# Patient Record
Sex: Male | Born: 1950 | Race: Black or African American | Hispanic: No | Marital: Married | Smoking: Never smoker
Health system: Southern US, Community
[De-identification: ages and names within clinical notes are randomized; demographics above are authoritative.]

## PROBLEM LIST (undated history)

## (undated) DIAGNOSIS — K219 Gastro-esophageal reflux disease without esophagitis: Secondary | ICD-10-CM

## (undated) DIAGNOSIS — L409 Psoriasis, unspecified: Secondary | ICD-10-CM

## (undated) DIAGNOSIS — L309 Dermatitis, unspecified: Secondary | ICD-10-CM

## (undated) DIAGNOSIS — R945 Abnormal results of liver function studies: Secondary | ICD-10-CM

## (undated) DIAGNOSIS — T7840XA Allergy, unspecified, initial encounter: Secondary | ICD-10-CM

## (undated) DIAGNOSIS — E785 Hyperlipidemia, unspecified: Secondary | ICD-10-CM

## (undated) DIAGNOSIS — I10 Essential (primary) hypertension: Secondary | ICD-10-CM

## (undated) HISTORY — DX: Abnormal results of liver function studies: R94.5

## (undated) HISTORY — DX: Gastro-esophageal reflux disease without esophagitis: K21.9

## (undated) HISTORY — DX: Essential (primary) hypertension: I10

## (undated) HISTORY — DX: Hyperlipidemia, unspecified: E78.5

## (undated) HISTORY — DX: Allergy, unspecified, initial encounter: T78.40XA

## (undated) HISTORY — DX: Dermatitis, unspecified: L30.9

## (undated) HISTORY — DX: Psoriasis, unspecified: L40.9

---

## 2005-05-13 ENCOUNTER — Ambulatory Visit (HOSPITAL_COMMUNITY): Admission: RE | Admit: 2005-05-13 | Discharge: 2005-05-13 | Payer: Self-pay | Admitting: *Deleted

## 2005-05-13 ENCOUNTER — Encounter (INDEPENDENT_AMBULATORY_CARE_PROVIDER_SITE_OTHER): Payer: Self-pay | Admitting: Specialist

## 2005-05-13 ENCOUNTER — Encounter: Payer: Self-pay | Admitting: Internal Medicine

## 2007-03-29 ENCOUNTER — Emergency Department (HOSPITAL_COMMUNITY): Admission: EM | Admit: 2007-03-29 | Discharge: 2007-03-29 | Payer: Self-pay | Admitting: Emergency Medicine

## 2008-04-29 ENCOUNTER — Ambulatory Visit (HOSPITAL_COMMUNITY): Admission: RE | Admit: 2008-04-29 | Discharge: 2008-04-29 | Payer: Self-pay | Admitting: Otolaryngology

## 2009-11-19 ENCOUNTER — Encounter: Payer: Self-pay | Admitting: Internal Medicine

## 2009-11-20 ENCOUNTER — Encounter (INDEPENDENT_AMBULATORY_CARE_PROVIDER_SITE_OTHER): Payer: Self-pay | Admitting: *Deleted

## 2009-11-28 ENCOUNTER — Encounter (INDEPENDENT_AMBULATORY_CARE_PROVIDER_SITE_OTHER): Payer: Self-pay | Admitting: *Deleted

## 2010-01-20 ENCOUNTER — Encounter (INDEPENDENT_AMBULATORY_CARE_PROVIDER_SITE_OTHER): Payer: Self-pay | Admitting: *Deleted

## 2010-01-20 ENCOUNTER — Ambulatory Visit: Payer: Self-pay | Admitting: Internal Medicine

## 2010-01-20 DIAGNOSIS — K219 Gastro-esophageal reflux disease without esophagitis: Secondary | ICD-10-CM | POA: Insufficient documentation

## 2010-01-20 DIAGNOSIS — R195 Other fecal abnormalities: Secondary | ICD-10-CM

## 2010-02-23 ENCOUNTER — Telehealth: Payer: Self-pay | Admitting: Internal Medicine

## 2010-02-23 ENCOUNTER — Ambulatory Visit: Payer: Self-pay | Admitting: Internal Medicine

## 2010-10-08 ENCOUNTER — Encounter (INDEPENDENT_AMBULATORY_CARE_PROVIDER_SITE_OTHER): Payer: Self-pay | Admitting: *Deleted

## 2010-10-19 ENCOUNTER — Encounter (INDEPENDENT_AMBULATORY_CARE_PROVIDER_SITE_OTHER): Payer: Self-pay | Admitting: *Deleted

## 2010-10-20 ENCOUNTER — Ambulatory Visit: Payer: Self-pay | Admitting: Internal Medicine

## 2010-11-02 ENCOUNTER — Ambulatory Visit: Payer: Self-pay | Admitting: Internal Medicine

## 2010-11-03 ENCOUNTER — Encounter: Payer: Self-pay | Admitting: Internal Medicine

## 2010-12-24 NOTE — Procedures (Signed)
Summary: Colonoscopy  Patient: [name] Note: All result statuses are Final unless otherwise noted.  Tests: (1) Colonoscopy (COL)   COL Colonoscopy           DONE     [facility]     [address]     [place], [place]  [address]           COLONOSCOPY PROCEDURE REPORT           PATIENT:  Frank Howell, Frank Howell  MR#:  [id number]     BIRTHDATE:  [birth year], 59 yrs. old  GENDER:  male     ENDOSCOPIST:  Iva Boop, MD, [facility]     REF. BY:  Robert Bellow, M.D.     PROCEDURE DATE:  11/02/2010     PROCEDURE:  Colonoscopy with snare polypectomy     ASA CLASS:  Class II     INDICATIONS:  heme positive stool     MEDICATIONS:   Fentanyl 50 mcg IV, Versed 5 mg           DESCRIPTION OF PROCEDURE:   After the risks benefits and     alternatives of the procedure were thoroughly explained, informed     consent was obtained.  Digital rectal exam was performed and     revealed external hemorrhoids, no rectal masses and normal     prostate.   The LB 180AL [id number] endoscope was introduced through     the anus and advanced to the cecum, which was identified by both     the appendix and ileocecal valve, without limitations.  The     quality of the prep was excellent, using MoviPrep.  The instrument     was then slowly withdrawn as the colon was fully examined.     <<PROCEDUREIMAGES>>           FINDINGS:  A diminutive polyp was found at the splenic flexure.     Polyp was snared without cautery. Retrieval was successful. snare     polyp  This was otherwise a normal examination of the colon.     External hemorrhoids were found in the anal canal.   Retroflexed     views in the rectum revealed no abnormalities.    The scope was     then withdrawn from the patient and the procedure completed.           COMPLICATIONS:  None     ENDOSCOPIC IMPRESSION:     1) Diminutive polyp at the splenic flexure     2) External hemorrhoids     3) Otherwise normal examination     RECOMMENDATIONS:        REPEAT EXAM:  In for Colonoscopy, pending biopsy results.           ______________________________     Iva Boop, MD, Clementeen Graham           CC:  Robert Bellow, MDThe Patient           n.     Rosalie DoctorIva Boop at 11/02/2010 09:01 AM           [place], [place], [phone]  Note: An exclamation mark (!) indicates a result that was not dispersed into the flowsheet. Document Creation Date: 11/02/2010 9:01 AM _______________________________________________________________________  (1) Order result status: Final Collection or observation date-time: 11/02/2010 08:54 Requested date-time:  Receipt date-time:  Reported date-time:  Referring Physician:   Ordering Physician: Stan Head [phone]) Specimen Source:  Source: Launa Grill Order Number: [id number] Lab site:   Appended Document: Colonoscopy   Colonoscopy  Procedure date:  11/02/2010  Findings:          1) Diminutive polyp at the splenic flexure     2) External hemorrhoids     3) Otherwise normal examination   1. Colon, polyp(s), splenic flexure :  -  TUBULAR ADENOMA (X1); NEGATIVE FOR HIGH GRADE DYSPLASIA OR MALIGNANCY.  Comments:      Repeat colonoscopy in 5 years.    Appended Document: Colonoscopy     Procedures Next Due Date:    Colonoscopy: 10/2015

## 2010-12-24 NOTE — Letter (Signed)
Summary: [facility]  [facility]   Imported By: Lester  01/27/2010 10:58:27  _____________________________________________________________________  External Attachment:    Type:   Image     Comment:   External Document

## 2010-12-24 NOTE — Progress Notes (Signed)
Summary: cx fee?  Phone Note Call from Patient   Caller: Patient Call For: Dr. Leone Payor Reason for Call: Talk to Doctor Summary of Call: Dr. Leone Payor, do you wish to charge this pt $100 cx fee? Pt has COL sch'ed for today at 11AM... called this morning stating that he "forgot" his appt and is not in [place]... informed pt that he may be charged a cx fee Initial call taken by: Vallarie Mare,  February 23, 2010 8:18 AM  Follow-up for Phone Call        YES  Follow-up by: Iva Boop MD, Clementeen Graham,  February 23, 2010 1:31 PM  Additional Follow-up for Phone Call Additional follow up Details #1::        Patient BILLED. Additional Follow-up by: Leanor Kail [facility],  February 25, 2010 3:42 PM

## 2010-12-24 NOTE — Letter (Signed)
Summary: New Patient letter  [facility]  [address] [place], [place] [address]   Phone: [phone]  Fax: [phone]       11/28/2009 MRN: [id number]  Frank Howell [address] [place], [place]  [address]  Dear Mr. Frank Howell,  Welcome to the Gastroenterology Division at [facility].    You are scheduled to see Dr.  Leone Payor on 12-24-09 at [facility] on the 3rd floor at [facility], [address]  We ask that you try to arrive at our office 15 minutes prior to your appointment time to allow for check-in.  We would like you to complete the enclosed self-administered evaluation form prior to your visit and bring it with you on the day of your appointment.  We will review it with you.  Also, please bring a complete list of all your medications or, if you prefer, bring the medication bottles and we will list them.  Please bring your insurance card so that we may make a copy of it.  If your insurance requires a referral to see a specialist, please bring your referral form from your primary care physician.  Co-payments are due at the time of your visit and may be paid by cash, check or credit card.     Your office visit will consist of a consult with your physician (includes a physical exam), any laboratory testing he/she may order, scheduling of any necessary diagnostic testing (e.g. x-ray, ultrasound, CT-scan), and scheduling of a procedure (e.g. Endoscopy, Colonoscopy) if required.  Please allow enough time on your schedule to allow for any/all of these possibilities.    If you cannot keep your appointment, please call [phone] to cancel or reschedule prior to your appointment date.  This allows [place] the opportunity to schedule an appointment for another patient in need of care.  If you do not cancel or reschedule by 5 p.m. the business day prior to your appointment date, you will be charged a $50.00 late cancellation/no-show fee.    Thank you for choosing [facility] for your medical needs.  We appreciate the opportunity to care for you.  Please visit [place] at our website  to learn more about our practice.                     Sincerely,                                                             The Gastroenterology Division

## 2010-12-24 NOTE — Procedures (Signed)
Summary: Colonoscopy: Dr. Virginia Rochester NORMAL   Colonoscopy  Procedure date:  05/13/2005  Findings:      Results: Normal. Location:  [facility].   NAME:  Frank Howell, Frank Howell NO.:  [id number]   MEDICAL RECORD NO.:  [id number]          PATIENT TYPE:  AMB   LOCATION:  ENDO                         FACILITY:  [facility]   PHYSICIAN:  [name], M.D.    DATE OF BIRTH:  May 16, 1951   DATE OF PROCEDURE:  05/13/2005  DATE OF DISCHARGE:                                 OPERATIVE REPORT   INDICATIONS:  Colon cancer screening.   ANESTHESIA:  Demerol 20, versed 1 milligram.   PROCEDURE:  With patient mildly sedated in the left lateral decubitus  position, a rectal examination was performed.  Perineum showed external skin  tags.  Rectal examination was unremarkable including normal prostate.  Subsequently, the Olympus videoscopic colonoscope was inserted through  rectum passed under direct vision to the cecum identified by ileocecal valve  and appendiceal orifice both of which were photographed.  From this point  colonoscope was slowly withdrawn taking circumferential views of the colonic  mucosa stopping in the rectum which appeared normal on direct and  retroflexed view.  The endoscope was straightened and withdrawn. The  patient's vital signs, pulse oximeter remained stable. The patient tolerated  procedure well without complications.   FINDINGS:  Rather unremarkable examination.   PLAN:  See endoscopy note further details.       GMO/MEDQ  D:  05/13/2005  T:  05/13/2005  Job:  [id number]   cc:   Jonita Albee, M.D.  [facility]  [address]  [place]  [place] [address]  Fax: [phone]  This report was created from the original endoscopy report, which was reviewed and signed by the above listed endoscopist.

## 2010-12-24 NOTE — Letter (Signed)
Summary: Pre Visit Letter Revised  [facility]  [address] [place], [place] [address]   Phone: [phone]  Fax: [phone]        10/08/2010 MRN: [id number] Frank Howell [address] [place], [place]  [address]             Procedure Date:  11/02/2010  Welcome to the Gastroenterology Division at [facility].    You are scheduled to see a nurse for your pre-procedure visit on 10/20/2010 at 8:00AM on the 3rd floor at [facility], [address].  We ask that you try to arrive at our office 15 minutes prior to your appointment time to allow for check-in.  Please take a minute to review the attached form.  If you answer "Yes" to one or more of the questions on the first page, we ask that you call the person listed at your earliest opportunity.  If you answer "No" to all of the questions, please complete the rest of the form and bring it to your appointment.    Your nurse visit will consist of discussing your medical and surgical history, your immediate family medical history, and your medications.   If you are unable to list all of your medications on the form, please bring the medication bottles to your appointment and we will list them.  We will need to be aware of both prescribed and over the counter drugs.  We will need to know exact dosage information as well.    Please be prepared to read and sign documents such as consent forms, a financial agreement, and acknowledgement forms.  If necessary, and with your consent, a friend or relative is welcome to sit-in on the nurse visit with you.  Please bring your insurance card so that we may make a copy of it.  If your insurance requires a referral to see a specialist, please bring your referral form from your primary care physician.  No co-pay is required for this nurse visit.     If you cannot keep your appointment, please call [phone] to cancel or reschedule prior to your appointment date.  This allows  [place] the opportunity to schedule an appointment for another patient in need of care.    Thank you for choosing [facility] for your medical needs.  We appreciate the opportunity to care for you.  Please visit [place] at our website  to learn more about our practice.  Sincerely, The Gastroenterology Division

## 2010-12-24 NOTE — Assessment & Plan Note (Signed)
Summary: HX OF BARRETTS ESOPHAGUS...EM   History of Present Illness Visit Type: Initial Consult Primary GI MD: Stan Head MD [facility] Primary Provider: Robert Bellow, MD Requesting Provider: Robert Bellow, MD Chief Complaint: h/o Barrett's Esophagus, heme+ stool History of Present Illness:   60 yo African American man found to have + hemoccult (Hemosure)of stool by Dr. Perrin Maltese recently. There is also a ? of Barrett's esophagus. Dr. Virginia Rochester had performed EGD and raised ? of Barrett's esophagus. Review of pathogy report shows thatthere was no intestinal metaplasia and therfore no Barrett's esophagus. He has no heartburn problems now. Also has had a prior colonoscopy in 2006 - Normal All other GI ROS negative.             Current Medications (verified): 1)  None  Allergies (verified): 1)  Pcn  Past History:  Past Medical History: GERD Asthma Hypertension  Past Surgical History: Unremarkable  Family History: No FH of Colon Cancer: Bone Cancer-sister  Social History: Married 3 boys Occupation: Airline pilot Patient has never smoked.  Alcohol Use - no Illicit Drug Use - no Smoking Status:  never Drug Use:  no  Review of Systems       The patient complains of skin rash.         All other ROS negative except as per HPI.   Vital Signs:  Patient profile:   60 year old male Height:      68 inches Weight:      168 pounds BMI:     25.64 Pulse rate:   88 / minute Pulse rhythm:   regular BP sitting:   120 / 78  (left arm)  Vitals Entered By: Milford Cage NCMA (January 20, 2010 3:46 PM)  Physical Exam  General:  Well developed, well nourished, no acute distress. Eyes:  PERRLA, no icterus. Mouth:  No deformity or lesions, dentition normal. Neck:  Supple; no masses or thyromegaly. Lungs:  Clear throughout to auscultation. Heart:  Regular rate and rhythm; no murmurs, rubs,  or bruits. Extremities:  see skin changes Skin:  scaly, hypertrophic thikened skin changes lower  extremities, especially pre-tibial area Cervical Nodes:  No significant cervical or supraclavicular adenopathy.  Psych:  Alert and cooperative. Normal mood and affect.   Impression & Recommendations:  Problem # 1:  FECAL OCCULT BLOOD (ICD-792.1) Assessment New COULD REPRESENT BLEEDING FROM COLORTECTAL NEOPLASIA SO COLONOSCOPY INDICATED Risks, benefits,and indications of endoscopic procedure(s) were reviewed with the patient and all questions answered.   Orders: Colonoscopy (Colon)  Problem # 2:  GERD (ICD-530.81) Assessment: New HE DOES NOT HAVE BARRETT'S AND IS ASYMPTOMATIC ON NO MEDICATION SO / IF HAS THIS, IT IS CONTROLLED IF SO  Patient Instructions: 1)  You do not have Barrett's esophagus. 2)  you will have a colonoscopy to investigate the (invisible) blood found in your stool. 3)  We will see you at your procedure on  02/23/10 4)  Please pick up your medications at your pharmacy. MOVIPREP 5)  [facility] Patient Information Guide given to patient.  6)  Colonoscopy and Flexible Sigmoidoscopy brochure given.  7)  Copy sent to : Mila Homer, MD 8)  The medication list was reviewed and reconciled.  All changed / newly prescribed medications were explained.  A complete medication list was provided to the patient / caregiver. Prescriptions: MOVIPREP 100 GM  SOLR (PEG-KCL-NACL-NASULF-NA ASC-C) As per prep instructions.  #1 x 0   Entered by:   Francee Piccolo CMA (AAMA)   Authorized by:  Iva Boop MD, [facility]   Signed by:   Francee Piccolo CMA (AAMA) on 01/20/2010   Method used:   Electronically to        [facility] [address] [phone]* (retail)       [address]       [place], [place]  [address]       Ph: [phone] or [phone]       Fax: [phone]   RxID:   [id number]

## 2010-12-24 NOTE — Procedures (Signed)
Summary: EGD:? Barrett's NOT PROVEN ON BXS   EGD  Procedure date:  05/13/2005  Findings:      Location: [facility]  Performed by Dr. Virl Diamond  NAME:  Frank Howell, Frank Howell                ACCOUNT NO.:  [id number]   MEDICAL RECORD NO.:  [id number]          PATIENT TYPE:  AMB   LOCATION:  ENDO                         FACILITY:  [facility]   PHYSICIAN:  [name], M.D.    DATE OF BIRTH:  18-Jan-1951   DATE OF PROCEDURE:  05/13/2005  DATE OF DISCHARGE:                                 OPERATIVE REPORT   .   PROCEDURE:  Upper endoscopy.   ENDOSCOPIST:  [name], M.D.   INDICATION:  GERD.   ANESTHESIA:  Demerol 60 mg, Versed 6 mg __________.   PROCEDURE:  With the patient mildly sedated in the left lateral decubitus  position, the Olympus videoscopic endoscope was inserted in the mouth and  passed under direct vision through the esophagus, which appeared normal,  until we reached the distal esophagus.  There appeared to be some short  segments of Barrett's esophagus, photographed and biopsied.  We entered into  the stomach; fundus, body, antrum, duodenal bulb and second portion of  duodenum appeared normal.  From this point, the endoscope was slowly  withdrawn, taking circumferential views of the duodenal mucosa until the  endoscope had been pulled back into the stomach and placed in retroflexion  to view the stomach from below.  The endoscope was then straightened and  withdrawn, taking circumferential views of the remaining gastric and  esophageal mucosa.  The patient's vital signs and pulse oximetry remained  stable.  The patient tolerated procedure well without apparent complication.   FINDINGS:  What appears to be short segment of Barrett's esophagus,  otherwise unremarkable endoscopic examination.   PLAN:  Await biopsy report.  The patient will call me for results and follow  up with me as an outpatient.  Proceed to colonoscopy as planned.  This report was  created from the original endoscopy report, which was reviewed and signed by the above listed endoscopist.

## 2010-12-24 NOTE — Letter (Signed)
Summary: [facility] Instructions  [facility]  [address] [place], [place] [address]   Phone: [phone]  Fax: [phone]       Frank Howell    [birth year]    MRN: [id number]      Procedure Day Dorna Bloom: Duanne Limerick, 60/02/23/10     Arrival Time: 10:00 AM      Procedure Time: 11:00 AM    Location of Procedure:                    _X_  [facility] (4th Floor)                       PREPARATION FOR COLONOSCOPY WITH MOVIPREP   Starting 5 days prior to your procedure 02/19/10 do not eat nuts, seeds, popcorn, corn, beans, peas,  salads, or any raw vegetables.  Do not take any fiber supplements (e.g. Metamucil, Citrucel, and Benefiber).  THE DAY BEFORE YOUR PROCEDURE         SUNDAY, 02/22/10  1.  Drink clear liquids the entire day-NO SOLID FOOD  2.  Do not drink anything colored red or purple.  Avoid juices with pulp.  No orange juice.  3.  Drink at least 64 oz. (8 glasses) of fluid/clear liquids during the day to prevent dehydration and help the prep work efficiently.  CLEAR LIQUIDS INCLUDE: Water Jello Ice Popsicles Tea (sugar ok, no milk/cream) Powdered fruit flavored drinks Coffee (sugar ok, no milk/cream) Gatorade Juice: apple, white grape, white cranberry  Lemonade Clear bullion, consomm, broth Carbonated beverages (any kind) Strained chicken noodle soup Hard Candy                             4.  In the morning, mix first dose of MoviPrep solution:    Empty 1 Pouch A and 1 Pouch B into the disposable container    Add lukewarm drinking water to the top line of the container. Mix to dissolve    Refrigerate (mixed solution should be used within 24 hrs)  5.  Begin drinking the prep at 5:00 p.m. The MoviPrep container is divided by 4 marks.   Every 15 minutes drink the solution down to the next mark (approximately 8 oz) until the full liter is complete.   6.  Follow completed prep with 16 oz of clear liquid of your choice (Nothing red or purple).   Continue to drink clear liquids until bedtime.  7.  Before going to bed, mix second dose of MoviPrep solution:    Empty 1 Pouch A and 1 Pouch B into the disposable container    Add lukewarm drinking water to the top line of the container. Mix to dissolve    Refrigerate  THE DAY OF YOUR PROCEDURE      MONDAY, 02/23/10  Beginning at 6:00 AM (5 hours before procedure):         1. Every 15 minutes, drink the solution down to the next mark (approx 8 oz) until the full liter is complete.  2. Follow completed prep with 16 oz. of clear liquid of your choice.    3. You may drink clear liquids until 9:00 AM (2 HOURS BEFORE PROCEDURE).  MEDICATION INSTRUCTIONS  Unless otherwise instructed, you should take regular prescription medications with a small sip of water   as early as possible the morning of your procedure.       OTHER INSTRUCTIONS  You will need a responsible adult at least 62 years of age to accompany you and drive you home.   This person must remain in the waiting room during your procedure.  Wear loose fitting clothing that is easily removed.  Leave jewelry and other valuables at home.  However, you may wish to bring a book to read or  an iPod/MP3 player to listen to music as you wait for your procedure to start.  Remove all body piercing jewelry and leave at home.  Total time from sign-in until discharge is approximately 2-3 hours.  You should go home directly after your procedure and rest.  You can resume normal activities the  day after your procedure.  The day of your procedure you should not:   Drive   Make legal decisions   Operate machinery   Drink alcohol   Return to work  You will receive specific instructions about eating, activities and medications before you leave.   The above instructions have been reviewed and explained to me by   _______________________    I fully understand and can verbalize these instructions  _____________________________ Date _________

## 2010-12-24 NOTE — Letter (Signed)
Summary: [facility] Instructions  [facility]  [address] [place], [place] [address]   Phone: [phone]  Fax: [phone]       Frank Howell    1951-09-10    MRN: [id number]        Procedure Day /Date: Monday 11-02-10     Arrival Time: 7:30 am     Procedure Time: 8:30 am     Location of Procedure:                    _x _  [facility] (4th Floor)   PREPARATION FOR COLONOSCOPY WITH MOVIPREP   Starting 5 days prior to your procedure  10-28-10 do not eat nuts, seeds, popcorn, corn, beans, peas,  salads, or any raw vegetables.  Do not take any fiber supplements (e.g. Metamucil, Citrucel, and Benefiber).  THE DAY BEFORE YOUR PROCEDURE         DATE:  11-01-10  DAY:  Sunday  1.  Drink clear liquids the entire day-NO SOLID FOOD  2.  Do not drink anything colored red or purple.  Avoid juices with pulp.  No orange juice.  3.  Drink at least 64 oz. (8 glasses) of fluid/clear liquids during the day to prevent dehydration and help the prep work efficiently.  CLEAR LIQUIDS INCLUDE: Water Jello Ice Popsicles Tea (sugar ok, no milk/cream) Powdered fruit flavored drinks Coffee (sugar ok, no milk/cream) Gatorade Juice: apple, white grape, white cranberry  Lemonade Clear bullion, consomm, broth Carbonated beverages (any kind) Strained chicken noodle soup Hard Candy                             4.  In the morning, mix first dose of MoviPrep solution:    Empty 1 Pouch A and 1 Pouch B into the disposable container    Add lukewarm drinking water to the top line of the container. Mix to dissolve    Refrigerate (mixed solution should be used within 24 hrs)  5.  Begin drinking the prep at 5:00 p.m. The MoviPrep container is divided by 4 marks.   Every 15 minutes drink the solution down to the next mark (approximately 8 oz) until the full liter is complete.   6.  Follow completed prep with 16 oz of clear liquid of your choice (Nothing red or purple).   Continue to drink clear liquids until bedtime.  7.  Before going to bed, mix second dose of MoviPrep solution:    Empty 1 Pouch A and 1 Pouch B into the disposable container    Add lukewarm drinking water to the top line of the container. Mix to dissolve    Refrigerate  THE DAY OF YOUR PROCEDURE      DATE:  11-02-10   DAY:  Monday  Beginning at  3:30 a.m. (5 hours before procedure):         1. Every 15 minutes, drink the solution down to the next mark (approx 8 oz) until the full liter is complete.  2. Follow completed prep with 16 oz. of clear liquid of your choice.    3. You may drink clear liquids until  6:30 a.m.  (2 HOURS BEFORE PROCEDURE).   MEDICATION INSTRUCTIONS  Unless otherwise instructed, you should take regular prescription medications with a small sip of water   as early as possible the morning of your procedure.   Additional medication instructions: Do not take Benazapril/HCTZ  day of procedure.         OTHER INSTRUCTIONS  You will need a responsible adult at least 62 years of age to accompany you and drive you home.   This person must remain in the waiting room during your procedure.  Wear loose fitting clothing that is easily removed.  Leave jewelry and other valuables at home.  However, you may wish to bring a book to read or  an iPod/MP3 player to listen to music as you wait for your procedure to start.  Remove all body piercing jewelry and leave at home.  Total time from sign-in until discharge is approximately 2-3 hours.  You should go home directly after your procedure and rest.  You can resume normal activities the  day after your procedure.  The day of your procedure you should not:   Drive   Make legal decisions   Operate machinery   Drink alcohol   Return to work  You will receive specific instructions about eating, activities and medications before you leave.    The above instructions have been reviewed and explained to me by    Ezra Sites RN  October 20, 2010 8:35 AM     I fully understand and can verbalize these instructions _____________________________ Date _________

## 2010-12-24 NOTE — Letter (Signed)
Summary: Patient Notice- Polyp Results  [facility]  [address] [place], [place] [address]   Phone: [phone]  Fax: [phone]        November 03, 2010 MRN: [id number]    [facility] [address] [place], [place]  [address]    Dear Mr. ERIKSSON,  The polyp removed from your colon was adenomatous. This means that it was pre-cancerous or that  it had the potential to change into cancer over time.  I recommend that you have a repeat colonoscopy in 5 years to determine if you have developed any new polyps over time and screen for colorectal cancer. If you develop any new rectal bleeding, abdominal pain or significant bowel habit changes, please contact us before then.  In addition to repeating colonoscopy, changing health habits may reduce your risk of having more colon or rectal  polyps and possibly, colorectal cancer. You may lower your risk of future polyps and colorectal cancer by adopting healthy habits such as not smoking or using tobacco (if you do), being physically active, losing weight (if overweight), and eating a diet which includes fruits and vegetables and limits red meat.  Please call us if you are having persistent problems or have questions about your condition that have not been fully answered at this time.  Sincerely,  Iva Boop MD, [facility]  This letter has been electronically signed by your physician.  Appended Document: Patient Notice- Polyp Results Letter mailed

## 2010-12-24 NOTE — Miscellaneous (Signed)
Summary: [facility]  Clinical Lists Changes  Medications: Added new medication of MOVIPREP 100 GM  SOLR (PEG-KCL-NACL-NASULF-NA ASC-C) As per prep instructions. - Signed Rx of MOVIPREP 100 GM  SOLR (PEG-KCL-NACL-NASULF-NA ASC-C) As per prep instructions.;  #1 x 0;  Signed;  Entered by: Ezra Sites RN;  Authorized by: Iva Boop MD, [facility];  Method used: [facility] [address] [phone]*, [address], [place], [place], [place]  [address], Ph: [phone] or [phone], Fax: [phone] Observations: Added new observation of ALLERGY REV: Done (10/20/2010 8:14)    Prescriptions: MOVIPREP 100 GM  SOLR (PEG-KCL-NACL-NASULF-NA ASC-C) As per prep instructions.  #1 x 0   Entered by:   Ezra Sites RN   Authorized by:   Iva Boop MD, [facility]   Signed by:   Ezra Sites RN on 10/20/2010   Method used:   [facility]        [facility] [address] [phone]* (retail)       [address]       [place], [place]  [address]       Ph: [phone] or [phone]       Fax: [phone]   RxID:   [id number]

## 2011-04-09 NOTE — Op Note (Signed)
NAMEJANUEL, DOOLAN                ACCOUNT NO.:  [id number]   MEDICAL RECORD NO.:  [id number]          PATIENT TYPE:  AMB   LOCATION:  ENDO                         FACILITY:  [facility]   PHYSICIAN:  [name], M.D.    DATE OF BIRTH:  September 18, 1951   DATE OF PROCEDURE:  05/13/2005  DATE OF DISCHARGE:                                 OPERATIVE REPORT   .   PROCEDURE:  Upper endoscopy.   ENDOSCOPIST:  [name], M.D.   INDICATION:  GERD.   ANESTHESIA:  Demerol 60 mg, Versed 6 mg __________.   PROCEDURE:  With the patient mildly sedated in the left lateral decubitus  position, the Olympus videoscopic endoscope was inserted in the mouth and  passed under direct vision through the esophagus, which appeared normal,  until we reached the distal esophagus.  There appeared to be some short  segments of Barrett's esophagus, photographed and biopsied.  We entered into  the stomach; fundus, body, antrum, duodenal bulb and second portion of  duodenum appeared normal.  From this point, the endoscope was slowly  withdrawn, taking circumferential views of the duodenal mucosa until the  endoscope had been pulled back into the stomach and placed in retroflexion  to view the stomach from below.  The endoscope was then straightened and  withdrawn, taking circumferential views of the remaining gastric and  esophageal mucosa.  The patient's vital signs and pulse oximetry remained  stable.  The patient tolerated procedure well without apparent complication.   FINDINGS:  What appears to be short segment of Barrett's esophagus,  otherwise unremarkable endoscopic examination.   PLAN:  Await biopsy report.  The patient will call me for results and follow  up with me as an outpatient.  Proceed to colonoscopy as planned.       GMO/MEDQ  D:  05/13/2005  T:  05/13/2005  Job:  [id number]

## 2011-04-09 NOTE — Op Note (Signed)
[name], Frank Howell                ACCOUNT NO.:  [id number]   MEDICAL RECORD NO.:  [id number]          PATIENT TYPE:  AMB   LOCATION:  ENDO                         FACILITY:  [facility]   PHYSICIAN:  [name], M.D.    DATE OF BIRTH:  13-Feb-1951   DATE OF PROCEDURE:  05/13/2005  DATE OF DISCHARGE:                                 OPERATIVE REPORT   INDICATIONS:  Colon cancer screening.   ANESTHESIA:  Demerol 20, versed 1 milligram.   PROCEDURE:  With patient mildly sedated in the left lateral decubitus  position, a rectal examination was performed.  Perineum showed external skin  tags.  Rectal examination was unremarkable including normal prostate.  Subsequently, the Olympus videoscopic colonoscope was inserted through  rectum passed under direct vision to the cecum identified by ileocecal valve  and appendiceal orifice both of which were photographed.  From this point  colonoscope was slowly withdrawn taking circumferential views of the colonic  mucosa stopping in the rectum which appeared normal on direct and  retroflexed view.  The endoscope was straightened and withdrawn. The  patient's vital signs, pulse oximeter remained stable. The patient tolerated  procedure well without complications.   FINDINGS:  Rather unremarkable examination.   PLAN:  See endoscopy note further details.       GMO/MEDQ  D:  05/13/2005  T:  05/13/2005  Job:  [id number]   cc:   Jonita Albee, M.D.  [facility]  [address]  [place]  [place] [address]  Fax: [phone]

## 2011-11-03 ENCOUNTER — Ambulatory Visit (INDEPENDENT_AMBULATORY_CARE_PROVIDER_SITE_OTHER): Payer: Federal, State, Local not specified - PPO

## 2011-11-03 DIAGNOSIS — H00019 Hordeolum externum unspecified eye, unspecified eyelid: Secondary | ICD-10-CM

## 2011-11-06 ENCOUNTER — Ambulatory Visit (INDEPENDENT_AMBULATORY_CARE_PROVIDER_SITE_OTHER): Payer: Federal, State, Local not specified - PPO

## 2011-11-06 DIAGNOSIS — H5710 Ocular pain, unspecified eye: Secondary | ICD-10-CM

## 2011-11-06 DIAGNOSIS — H00019 Hordeolum externum unspecified eye, unspecified eyelid: Secondary | ICD-10-CM

## 2011-12-27 ENCOUNTER — Encounter: Payer: Self-pay | Admitting: Internal Medicine

## 2012-01-10 ENCOUNTER — Ambulatory Visit (INDEPENDENT_AMBULATORY_CARE_PROVIDER_SITE_OTHER): Payer: BC Managed Care – PPO | Admitting: Family Medicine

## 2012-01-10 DIAGNOSIS — E785 Hyperlipidemia, unspecified: Secondary | ICD-10-CM | POA: Insufficient documentation

## 2012-01-10 DIAGNOSIS — J019 Acute sinusitis, unspecified: Secondary | ICD-10-CM

## 2012-01-10 DIAGNOSIS — J329 Chronic sinusitis, unspecified: Secondary | ICD-10-CM

## 2012-01-10 DIAGNOSIS — J45909 Unspecified asthma, uncomplicated: Secondary | ICD-10-CM | POA: Insufficient documentation

## 2012-01-10 DIAGNOSIS — I10 Essential (primary) hypertension: Secondary | ICD-10-CM | POA: Insufficient documentation

## 2012-01-10 DIAGNOSIS — S139XXA Sprain of joints and ligaments of unspecified parts of neck, initial encounter: Secondary | ICD-10-CM

## 2012-01-10 DIAGNOSIS — S161XXA Strain of muscle, fascia and tendon at neck level, initial encounter: Secondary | ICD-10-CM

## 2012-01-10 MED ORDER — AZITHROMYCIN 250 MG PO TABS: ORAL_TABLET | ORAL | Status: AC
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
## 2012-02-03 ENCOUNTER — Other Ambulatory Visit: Payer: Self-pay | Admitting: Family Medicine

## 2012-03-13 ENCOUNTER — Ambulatory Visit (INDEPENDENT_AMBULATORY_CARE_PROVIDER_SITE_OTHER): Payer: BC Managed Care – PPO | Admitting: Family Medicine

## 2012-03-13 ENCOUNTER — Encounter: Payer: Self-pay | Admitting: Family Medicine

## 2012-03-13 VITALS — BP 146/91 | HR 77 | Temp 98.0°F | Resp 16 | Ht 67.0 in | Wt 167.6 lb

## 2012-03-13 DIAGNOSIS — R1013 Epigastric pain: Secondary | ICD-10-CM

## 2012-03-13 DIAGNOSIS — R51 Headache: Secondary | ICD-10-CM

## 2012-03-13 DIAGNOSIS — K3189 Other diseases of stomach and duodenum: Secondary | ICD-10-CM

## 2012-03-13 MED ORDER — OMEPRAZOLE 40 MG PO CPDR: 40.0000 mg | DELAYED_RELEASE_CAPSULE | Freq: Every day | ORAL | Status: DC

## 2012-03-13 MED ORDER — BUTALBITAL-APAP-CAFFEINE 50-325-40 MG PO TABS: 1.0000 | ORAL_TABLET | Freq: Four times a day (QID) | ORAL | Status: AC | PRN

## 2012-03-13 NOTE — Progress Notes (Signed)
Subjective: He has had symptoms for the last couple of weeks. His stomach gets rumbling and when he eats something it seems to bother him. He has not eaten today it is not bothering him right now. His asthma has been stable, using the inhaler regularly. He has been having frontal headaches. He does have some dental problems with his teeth see a dentist for that.   Objective: Alert oriented male in no acute distress. TMs normal. Eyes PERRLA. Throat clear. Neck supple without significant nodes. Chest clear. Heart regular without murmurs. Abdomen soft without masses or tenderness.  Assessment: Epigastric pains   headachen  Allergies  Plan: Dermatic treatment for headaches. See his dentist. PPI for his stomach. He has an appointment for a physical soon I believe.

## 2012-03-27 ENCOUNTER — Ambulatory Visit (INDEPENDENT_AMBULATORY_CARE_PROVIDER_SITE_OTHER): Payer: BC Managed Care – PPO | Admitting: Internal Medicine

## 2012-03-27 ENCOUNTER — Encounter: Payer: Self-pay | Admitting: Internal Medicine

## 2012-03-27 VITALS — BP 142/95 | HR 74 | Temp 97.4°F | Resp 16 | Ht 67.5 in | Wt 166.0 lb

## 2012-03-27 DIAGNOSIS — I10 Essential (primary) hypertension: Secondary | ICD-10-CM

## 2012-03-27 DIAGNOSIS — Z0000 Encounter for general adult medical examination without abnormal findings: Secondary | ICD-10-CM

## 2012-03-27 DIAGNOSIS — J45909 Unspecified asthma, uncomplicated: Secondary | ICD-10-CM

## 2012-03-27 DIAGNOSIS — Z79899 Other long term (current) drug therapy: Secondary | ICD-10-CM

## 2012-03-27 DIAGNOSIS — Z23 Encounter for immunization: Secondary | ICD-10-CM

## 2012-03-27 DIAGNOSIS — L408 Other psoriasis: Secondary | ICD-10-CM

## 2012-03-27 LAB — COMPREHENSIVE METABOLIC PANEL
AST: 19 U/L (ref 0–37)
Albumin: 4.3 g/dL (ref 3.5–5.2)
Alkaline Phosphatase: 82 U/L (ref 39–117)
BUN: 15 mg/dL (ref 6–23)
Potassium: 3.9 mEq/L (ref 3.5–5.3)
Sodium: 140 mEq/L (ref 135–145)
Total Bilirubin: 0.7 mg/dL (ref 0.3–1.2)
Total Protein: 7.5 g/dL (ref 6.0–8.3)

## 2012-03-27 LAB — POCT URINALYSIS DIPSTICK
Glucose, UA: NEGATIVE
Nitrite, UA: NEGATIVE
Protein, UA: NEGATIVE
Urobilinogen, UA: 0.2
pH, UA: 6.5

## 2012-03-27 LAB — LIPID PANEL
HDL: 49 mg/dL (ref >39)
LDL Cholesterol: 163 mg/dL — ABNORMAL HIGH (ref 0–99)
Total CHOL/HDL Ratio: 4.9 Ratio
Triglycerides: 132 mg/dL (ref <150)
VLDL: 26 mg/dL (ref 0–40)

## 2012-03-27 LAB — POCT UA - MICROSCOPIC ONLY
Casts, Ur, LPF, POC: NEGATIVE
Crystals, Ur, HPF, POC: NEGATIVE
Yeast, UA: NEGATIVE

## 2012-03-27 NOTE — Progress Notes (Signed)
  Subjective:    Patient ID: Frank Howell, male    DOB: May 21, 1951, 61 y.o.   MRN: [id number]  HPI Doing well.  Did not take his meds yet today. HTN , GERD, Psoriasis, Lipid disorder all controlled doin ok. See hx scanned   Review of Systems see ROS scanned   Objective:   Physical Exam  Constitutional: He is oriented to person, place, and time. He appears well-developed and well-nourished.  HENT:  Right Ear: External ear normal.  Left Ear: External ear normal.  Nose: Nose normal.  Mouth/Throat: Oropharynx is clear and moist.  Eyes: EOM are normal. Pupils are equal, round, and reactive to light. No scleral icterus.  Neck: Normal range of motion. Neck supple. No thyromegaly present.  Cardiovascular: Normal rate, regular rhythm and normal heart sounds.   Pulmonary/Chest: Effort normal. No respiratory distress. He has wheezes.  Abdominal: Soft. Bowel sounds are normal. He exhibits no mass. There is no tenderness.  Genitourinary: Rectum normal, prostate normal and penis normal.  Musculoskeletal: Normal range of motion.  Lymphadenopathy:    He has no cervical adenopathy.  Neurological: He is alert and oriented to person, place, and time. He has normal reflexes. He displays normal reflexes. No cranial nerve deficit. Coordination normal.  Skin: Rash noted.       Scattered patches of psoriasis  Psychiatric: He has a normal mood and affect.    EKG Labs pending Results for orders placed in visit on 03/27/12  POCT URINALYSIS DIPSTICK      Component Value Range   Color, UA yellow     Clarity, UA clear     Glucose, UA neg     Bilirubin, UA neg     Ketones, UA neg     Spec Grav, UA 1.015     Blood, UA trace     pH, UA 6.5     Protein, UA neg     Urobilinogen, UA 0.2     Nitrite, UA neg     Leukocytes, UA Negative    POCT UA - MICROSCOPIC ONLY      Component Value Range   WBC, Ur, HPF, POC 2-5     RBC, urine, microscopic 1-3     Bacteria, U Microscopic 1+     Mucus, UA pos     Epithelial cells, urine per micros 0-1     Crystals, Ur, HPF, POC neg     Casts, Ur, LPF, POC neg     Yeast, UA neg         Assessment & Plan:  RF meds 1 yr. Healthy exam

## 2012-04-09 ENCOUNTER — Ambulatory Visit (INDEPENDENT_AMBULATORY_CARE_PROVIDER_SITE_OTHER): Payer: BC Managed Care – PPO | Admitting: Internal Medicine

## 2012-04-09 VITALS — BP 145/82 | HR 87 | Temp 98.0°F | Resp 16 | Ht 67.5 in | Wt 168.0 lb

## 2012-04-09 DIAGNOSIS — N50819 Testicular pain, unspecified: Secondary | ICD-10-CM

## 2012-04-09 DIAGNOSIS — E785 Hyperlipidemia, unspecified: Secondary | ICD-10-CM

## 2012-04-09 DIAGNOSIS — N509 Disorder of male genital organs, unspecified: Secondary | ICD-10-CM

## 2012-04-09 DIAGNOSIS — N529 Male erectile dysfunction, unspecified: Secondary | ICD-10-CM

## 2012-04-09 DIAGNOSIS — I10 Essential (primary) hypertension: Secondary | ICD-10-CM

## 2012-04-09 DIAGNOSIS — R7989 Other specified abnormal findings of blood chemistry: Secondary | ICD-10-CM

## 2012-04-09 LAB — POCT UA - MICROSCOPIC ONLY
Casts, Ur, LPF, POC: NEGATIVE
WBC, Ur, HPF, POC: NEGATIVE
Yeast, UA: NEGATIVE

## 2012-04-09 LAB — POCT URINALYSIS DIPSTICK
Leukocytes, UA: NEGATIVE
Protein, UA: NEGATIVE
Urobilinogen, UA: 0.2
pH, UA: 7

## 2012-04-09 MED ORDER — PRAVASTATIN SODIUM 40 MG PO TABS: 40.0000 mg | ORAL_TABLET | Freq: Every day | ORAL | Status: DC

## 2012-04-09 MED ORDER — SILDENAFIL CITRATE 100 MG PO TABS: 50.0000 mg | ORAL_TABLET | Freq: Every day | ORAL | Status: DC | PRN

## 2012-04-09 NOTE — Progress Notes (Signed)
  Subjective:    Patient ID: Frank Howell, male    DOB: October 31, 1951, 61 y.o.   MRN: [id number]  HPI Needs RF of pravastatin Requests viagra Has occ testicle ache, none now  Review of Systems     Objective:   Physical Exam Normal genital exam   Results for orders placed in visit on 04/09/12  POCT URINALYSIS DIPSTICK      Component Value Range   Color, UA yellow     Clarity, UA clear     Glucose, UA neg     Bilirubin, UA neg     Ketones, UA neg     Spec Grav, UA 1.015     Blood, UA neg     pH, UA 7.0     Protein, UA neg     Urobilinogen, UA 0.2     Nitrite, UA neg     Leukocytes, UA Negative    POCT UA - MICROSCOPIC ONLY      Component Value Range   WBC, Ur, HPF, POC neg     RBC, urine, microscopic 2-3     Bacteria, U Microscopic trace     Mucus, UA neg     Epithelial cells, urine per micros 0-1     Crystals, Ur, HPF, POC neg     Casts, Ur, LPF, POC neg     Yeast, UA neg         Assessment & Plan:  ED Testicle ache Needs RF Counseled

## 2012-04-21 ENCOUNTER — Other Ambulatory Visit: Payer: Self-pay | Admitting: Physician Assistant

## 2012-05-17 ENCOUNTER — Other Ambulatory Visit: Payer: Self-pay | Admitting: Family Medicine

## 2012-05-20 ENCOUNTER — Ambulatory Visit (INDEPENDENT_AMBULATORY_CARE_PROVIDER_SITE_OTHER): Payer: BC Managed Care – PPO | Admitting: Family Medicine

## 2012-05-20 ENCOUNTER — Ambulatory Visit: Payer: BC Managed Care – PPO

## 2012-05-20 VITALS — BP 125/82 | HR 65 | Temp 98.1°F | Resp 16 | Ht 67.75 in | Wt 167.4 lb

## 2012-05-20 DIAGNOSIS — K219 Gastro-esophageal reflux disease without esophagitis: Secondary | ICD-10-CM

## 2012-05-20 DIAGNOSIS — R1013 Epigastric pain: Secondary | ICD-10-CM

## 2012-05-20 DIAGNOSIS — R0789 Other chest pain: Secondary | ICD-10-CM

## 2012-05-20 DIAGNOSIS — R079 Chest pain, unspecified: Secondary | ICD-10-CM

## 2012-05-20 DIAGNOSIS — E785 Hyperlipidemia, unspecified: Secondary | ICD-10-CM

## 2012-05-20 LAB — POCT URINALYSIS DIPSTICK
Bilirubin, UA: NEGATIVE
Blood, UA: NEGATIVE
Glucose, UA: NEGATIVE
Ketones, UA: NEGATIVE
Leukocytes, UA: NEGATIVE
Nitrite, UA: NEGATIVE
Protein, UA: NEGATIVE
Spec Grav, UA: 1.015
Urobilinogen, UA: 0.2
pH, UA: 7

## 2012-05-20 LAB — COMPREHENSIVE METABOLIC PANEL
ALT: 18 U/L (ref 0–53)
AST: 18 U/L (ref 0–37)
Albumin: 4.1 g/dL (ref 3.5–5.2)
Alkaline Phosphatase: 72 U/L (ref 39–117)
BUN: 13 mg/dL (ref 6–23)
CO2: 30 mEq/L (ref 19–32)
Calcium: 9.4 mg/dL (ref 8.4–10.5)
Chloride: 103 mEq/L (ref 96–112)
Creat: 1.47 mg/dL — ABNORMAL HIGH (ref 0.50–1.35)
Glucose, Bld: 94 mg/dL (ref 70–99)
Potassium: 3.9 mEq/L (ref 3.5–5.3)
Sodium: 140 mEq/L (ref 135–145)
Total Bilirubin: 1.2 mg/dL (ref 0.3–1.2)
Total Protein: 7.1 g/dL (ref 6.0–8.3)

## 2012-05-20 LAB — LIPID PANEL
Cholesterol: 193 mg/dL (ref 0–200)
HDL: 47 mg/dL (ref >39)
LDL Cholesterol: 125 mg/dL — ABNORMAL HIGH (ref 0–99)
Total CHOL/HDL Ratio: 4.1 Ratio
Triglycerides: 105 mg/dL (ref <150)
VLDL: 21 mg/dL (ref 0–40)

## 2012-05-20 MED ORDER — MELOXICAM 7.5 MG PO TABS: 7.5000 mg | ORAL_TABLET | Freq: Every day | ORAL | Status: AC

## 2012-05-20 MED ORDER — OMEPRAZOLE 40 MG PO CPDR: 40.0000 mg | DELAYED_RELEASE_CAPSULE | Freq: Every day | ORAL | Status: DC

## 2012-05-20 NOTE — Progress Notes (Signed)
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  Doesn't awaken patient at night.  Seems to improve with walking.  Can walk an hour at a time without the tightness.  No diaphoresis, nausea or shortness of breath.  No fatigue.  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 Milus Glazier CXR:  No active disease EKG:.J point elevation similar to 2010 EKG Results for orders placed in visit on 05/20/12  POCT URINALYSIS DIPSTICK      Component Value Range   Color, UA yellow     Clarity, UA clear     Glucose, UA neg     Bilirubin, UA neg     Ketones, UA neg     Spec Grav, UA 1.015     Blood, UA neg     pH, UA 7.0     Protein, UA neg     Urobilinogen, UA 0.2     Nitrite, UA neg     Leukocytes, UA Negative        Assessment:  Chest wall pain with some reflux  Plan:  Follow up 1 week 1. Hyperlipidemia  Comprehensive metabolic panel, Lipid panel  2. Chest pain  EKG 12-Lead, DG Chest 2 View, omeprazole (PRILOSEC) 40 MG capsule, meloxicam (MOBIC) 7.5 MG tablet  3. Dyspepsia  omeprazole (PRILOSEC) 40 MG capsule

## 2012-07-10 ENCOUNTER — Telehealth: Payer: Self-pay

## 2012-07-10 NOTE — Telephone Encounter (Signed)
PT WANTS TO KNOW IF HE IS UP TO DATE WITH HIM IMMUNIZATIONS FOR TRAVEL OVERSEAS (PARTICALLY THE TETANUS VACCINATION) CALL [phone]

## 2012-07-10 NOTE — Telephone Encounter (Signed)
Last Tdap was 2010 called patient to advise. He was only concerned with this one.

## 2012-07-20 ENCOUNTER — Ambulatory Visit (INDEPENDENT_AMBULATORY_CARE_PROVIDER_SITE_OTHER): Payer: BC Managed Care – PPO | Admitting: Emergency Medicine

## 2012-07-20 ENCOUNTER — Ambulatory Visit: Payer: BC Managed Care – PPO

## 2012-07-20 VITALS — BP 128/81 | HR 95 | Temp 99.0°F | Resp 16 | Ht 67.5 in | Wt 167.0 lb

## 2012-07-20 DIAGNOSIS — R062 Wheezing: Secondary | ICD-10-CM

## 2012-07-20 DIAGNOSIS — J189 Pneumonia, unspecified organism: Secondary | ICD-10-CM

## 2012-07-20 DIAGNOSIS — R05 Cough: Secondary | ICD-10-CM

## 2012-07-20 DIAGNOSIS — R509 Fever, unspecified: Secondary | ICD-10-CM

## 2012-07-20 DIAGNOSIS — R0989 Other specified symptoms and signs involving the circulatory and respiratory systems: Secondary | ICD-10-CM

## 2012-07-20 DIAGNOSIS — J158 Pneumonia due to other specified bacteria: Secondary | ICD-10-CM

## 2012-07-20 LAB — POCT CBC
HCT, POC: 49.2 % (ref 43.5–53.7)
Hemoglobin: 15.5 g/dL (ref 14.1–18.1)
Lymph, poc: 1.1 (ref 0.6–3.4)
MCH, POC: 28.6 pg (ref 27–31.2)
MCHC: 31.5 g/dL — AB (ref 31.8–35.4)
MCV: 90.7 fL (ref 80–97)
MPV: 10.4 fL (ref 0–99.8)
POC MID %: 5.4 %M (ref 0–12)
RBC: 5.42 M/uL (ref 4.69–6.13)
WBC: 11.2 10*3/uL — AB (ref 4.6–10.2)

## 2012-07-20 LAB — POCT INFLUENZA A/B: Influenza B, POC: NEGATIVE

## 2012-07-20 MED ORDER — LEVOFLOXACIN 500 MG PO TABS: 500.0000 mg | ORAL_TABLET | Freq: Every day | ORAL | Status: AC

## 2012-07-20 MED ORDER — BENZONATATE 100 MG PO CAPS: ORAL_CAPSULE | ORAL | Status: DC

## 2012-07-20 NOTE — Patient Instructions (Addendum)
Cough, Adult  A cough is a reflex that helps clear your throat and airways. It can help heal the body or may be a reaction to an irritated airway. A cough may only last 2 or 3 weeks (acute) or may last more than 8 weeks (chronic).  CAUSES Acute cough:  Viral or bacterial infections.  Chronic cough:  Infections.   Allergies.   Asthma.   Post-nasal drip.   Smoking.   Heartburn or acid reflux.   Some medicines.   Chronic lung problems (COPD).   Cancer.  SYMPTOMS   Cough.   Fever.   Chest pain.   Increased breathing rate.   High-pitched whistling sound when breathing (wheezing).   Colored mucus that you cough up (sputum).  TREATMENT   A bacterial cough may be treated with antibiotic medicine.   A viral cough must run its course and will not respond to antibiotics.   Your caregiver may recommend other treatments if you have a chronic cough.  HOME CARE INSTRUCTIONS   Only take over-the-counter or prescription medicines for pain, discomfort, or fever as directed by your caregiver. Use cough suppressants only as directed by your caregiver.   Use a cold steam vaporizer or humidifier in your bedroom or home to help loosen secretions.   Sleep in a semi-upright position if your cough is worse at night.   Rest as needed.   Stop smoking if you smoke.  SEEK IMMEDIATE MEDICAL CARE IF:   You have pus in your sputum.   Your cough starts to worsen.   You cannot control your cough with suppressants and are losing sleep.   You begin coughing up blood.   You have difficulty breathing.   You develop pain which is getting worse or is uncontrolled with medicine.   You have a fever.  MAKE SURE YOU:   Understand these instructions.   Will watch your condition.   Will get help right away if you are not doing well or get worse.  Document Released: 05/07/2011 Document Revised: 10/28/2011 Document Reviewed: 05/07/2011 [facility] Patient Information 2012 [place],  [place].Pneumonia, Adult Pneumonia is an infection of the lungs.  CAUSES Pneumonia may be caused by bacteria or a virus. Usually, these infections are caused by breathing infectious particles into the lungs (respiratory tract). SYMPTOMS   Cough.   Fever.   Chest pain.   Increased rate of breathing.   Wheezing.   Mucus production.  DIAGNOSIS  If you have the common symptoms of pneumonia, your caregiver will typically confirm the diagnosis with a chest X-ray. The X-ray will show an abnormality in the lung (pulmonary infiltrate) if you have pneumonia. Other tests of your blood, urine, or sputum may be done to find the specific cause of your pneumonia. Your caregiver may also do tests (blood gases or pulse oximetry) to see how well your lungs are working. TREATMENT  Some forms of pneumonia may be spread to other people when you cough or sneeze. You may be asked to wear a mask before and during your exam. Pneumonia that is caused by bacteria is treated with antibiotic medicine. Pneumonia that is caused by the influenza virus may be treated with an antiviral medicine. Most other viral infections must run their course. These infections will not respond to antibiotics.  PREVENTION A pneumococcal shot (vaccine) is available to prevent a common bacterial cause of pneumonia. This is usually suggested for:  People over 90 years old.   Patients on chemotherapy.   People with chronic  lung problems, such as bronchitis or emphysema.   People with immune system problems.  If you are over 65 or have a high risk condition, you may receive the pneumococcal vaccine if you have not received it before. In some countries, a routine influenza vaccine is also recommended. This vaccine can help prevent some cases of pneumonia.You may be offered the influenza vaccine as part of your care. If you smoke, it is time to quit. You may receive instructions on how to stop smoking. Your caregiver can provide medicines and  counseling to help you quit. HOME CARE INSTRUCTIONS   Cough suppressants may be used if you are losing too much rest. However, coughing protects you by clearing your lungs. You should avoid using cough suppressants if you can.   Your caregiver may have prescribed medicine if he or she thinks your pneumonia is caused by a bacteria or influenza. Finish your medicine even if you start to feel better.   Your caregiver may also prescribe an expectorant. This loosens the mucus to be coughed up.   Only take over-the-counter or prescription medicines for pain, discomfort, or fever as directed by your caregiver.   Do not smoke. Smoking is a common cause of bronchitis and can contribute to pneumonia. If you are a smoker and continue to smoke, your cough may last several weeks after your pneumonia has cleared.   A cold steam vaporizer or humidifier in your room or home may help loosen mucus.   Coughing is often worse at night. Sleeping in a semi-upright position in a recliner or using a couple pillows under your head will help with this.   Get rest as you feel it is needed. Your body will usually let you know when you need to rest.  SEEK IMMEDIATE MEDICAL CARE IF:   Your illness becomes worse. This is especially true if you are elderly or weakened from any other disease.   You cannot control your cough with suppressants and are losing sleep.   You begin coughing up blood.   You develop pain which is getting worse or is uncontrolled with medicines.   You have a fever.   Any of the symptoms which initially brought you in for treatment are getting worse rather than better.   You develop shortness of breath or chest pain.  MAKE SURE YOU:   Understand these instructions.   Will watch your condition.   Will get help right away if you are not doing well or get worse.  Document Released: 11/08/2005 Document Revised: 10/28/2011 Document Reviewed: 01/28/2011 [facility] Patient Information 2012  [place], [place].

## 2012-07-20 NOTE — Progress Notes (Signed)
  Subjective:    Patient ID: Frank Howell, male    DOB: 1951-07-07, 61 y.o.   MRN: [id number]  HPI patient enters with onset Monday of bodyaches and generally feeling poorly. He awakened Tuesday morning has sore throat with GI upset. This was followed by diffuse myalgias and since then he's also developed a cough which has been productive of small amounts of phlegm. He's had no true shortness of breath but in general feels poorly with poor appetite myalgias and fatigue    Review of Systems     Objective:   Physical Exam  Constitutional: He appears well-developed and well-nourished.  HENT:  Right Ear: External ear normal.  Left Ear: External ear normal.  Eyes: Pupils are equal, round, and reactive to light.  Neck: No tracheal deviation present. No thyromegaly present.  Cardiovascular: Normal rate and regular rhythm.   Pulmonary/Chest: Effort normal.       There are rhonchi present in the left base.   Results for orders placed in visit on 07/20/12  POCT CBC      Component Value Range   WBC 11.2 (*) 4.6 - 10.2 K/uL   Lymph, poc 1.1  0.6 - 3.4   POC LYMPH PERCENT 9.9 (*) 10 - 50 %L   MID (cbc) 0.6  0 - 0.9   POC MID % 5.4  0 - 12 %M   POC Granulocyte 9.5 (*) 2 - 6.9   Granulocyte percent 84.7 (*) 37 - 80 %G   RBC 5.42  4.69 - 6.13 M/uL   Hemoglobin 15.5  14.1 - 18.1 g/dL   HCT, POC 65.7  84.6 - 53.7 %   MCV 90.7  80 - 97 fL   MCH, POC 28.6  27 - 31.2 pg   MCHC 31.5 (*) 31.8 - 35.4 g/dL   RDW, POC 96.2     Platelet Count, POC 173  142 - 424 K/uL   MPV 10.4  0 - 99.8 fL  POCT RAPID STREP A (OFFICE)      Component Value Range   Rapid Strep A Screen Negative  Negative  POCT INFLUENZA A/B      Component Value Range   Influenza A, POC Negative     Influenza B, POC Negative       [facility] reading (PRIMARY) by  Dr.Daub their increased markings retrocardiac and increased markings seen on the lateral chest x-ray.      Assessment & Plan:  White blood cell count is elevated . There  is an infiltrate present in the left base we'll go ahead and cover this with Levaquin for 7 days along with Mucinex fluids and Tylenol.

## 2012-10-02 ENCOUNTER — Ambulatory Visit: Payer: BC Managed Care – PPO | Admitting: Internal Medicine

## 2012-10-08 ENCOUNTER — Other Ambulatory Visit: Payer: Self-pay | Admitting: Internal Medicine

## 2012-11-09 ENCOUNTER — Telehealth: Payer: Self-pay

## 2012-11-09 MED ORDER — BENAZEPRIL-HYDROCHLOROTHIAZIDE 20-12.5 MG PO TABS: 1.0000 | ORAL_TABLET | Freq: Every day | ORAL | Status: DC

## 2012-11-09 NOTE — Telephone Encounter (Signed)
Pt has made first available appt with Dr. Perrin Maltese for 2/3, will need 2 months of hctz to make it until this appt.  [facility] [phone]

## 2012-12-25 ENCOUNTER — Ambulatory Visit: Payer: BC Managed Care – PPO | Admitting: Internal Medicine

## 2013-02-02 ENCOUNTER — Other Ambulatory Visit: Payer: Self-pay | Admitting: Physician Assistant

## 2013-03-19 ENCOUNTER — Other Ambulatory Visit: Payer: Self-pay | Admitting: Physician Assistant

## 2013-05-12 ENCOUNTER — Other Ambulatory Visit: Payer: Self-pay | Admitting: Physician Assistant

## 2013-05-12 NOTE — Telephone Encounter (Signed)
Needs OV, labs 

## 2013-05-17 ENCOUNTER — Ambulatory Visit (INDEPENDENT_AMBULATORY_CARE_PROVIDER_SITE_OTHER): Payer: BC Managed Care – PPO | Admitting: Internal Medicine

## 2013-05-17 VITALS — BP 134/92 | HR 75 | Temp 98.1°F | Resp 18 | Ht 67.5 in | Wt 165.0 lb

## 2013-05-17 DIAGNOSIS — I10 Essential (primary) hypertension: Secondary | ICD-10-CM

## 2013-05-17 DIAGNOSIS — E785 Hyperlipidemia, unspecified: Secondary | ICD-10-CM

## 2013-05-17 DIAGNOSIS — Z79899 Other long term (current) drug therapy: Secondary | ICD-10-CM

## 2013-05-17 DIAGNOSIS — J45909 Unspecified asthma, uncomplicated: Secondary | ICD-10-CM

## 2013-05-17 DIAGNOSIS — Z0000 Encounter for general adult medical examination without abnormal findings: Secondary | ICD-10-CM

## 2013-05-17 LAB — COMPREHENSIVE METABOLIC PANEL
AST: 23 U/L (ref 0–37)
Albumin: 4.3 g/dL (ref 3.5–5.2)
BUN: 13 mg/dL (ref 6–23)
CO2: 31 mEq/L (ref 19–32)
Calcium: 9.5 mg/dL (ref 8.4–10.5)
Chloride: 102 mEq/L (ref 96–112)
Creat: 1.51 mg/dL — ABNORMAL HIGH (ref 0.50–1.35)
Glucose, Bld: 95 mg/dL (ref 70–99)
Potassium: 4.5 mEq/L (ref 3.5–5.3)

## 2013-05-17 LAB — POCT URINALYSIS DIPSTICK
Bilirubin, UA: NEGATIVE
Blood, UA: NEGATIVE
Glucose, UA: NEGATIVE
Ketones, UA: NEGATIVE
Nitrite, UA: NEGATIVE
Spec Grav, UA: 1.01
pH, UA: 6.5

## 2013-05-17 LAB — POCT CBC
Granulocyte percent: 64.9 %G (ref 37–80)
HCT, POC: 51.1 % (ref 43.5–53.7)
Hemoglobin: 16.5 g/dL (ref 14.1–18.1)
MCV: 92.4 fL (ref 80–97)
POC Granulocyte: 3.6 (ref 2–6.9)
POC LYMPH PERCENT: 27.6 %L (ref 10–50)
RDW, POC: 14.1 %

## 2013-05-17 LAB — IFOBT (OCCULT BLOOD): IFOBT: POSITIVE

## 2013-05-17 LAB — POCT UA - MICROSCOPIC ONLY
Bacteria, U Microscopic: NEGATIVE
Epithelial cells, urine per micros: NEGATIVE
Mucus, UA: NEGATIVE
RBC, urine, microscopic: NEGATIVE
WBC, Ur, HPF, POC: NEGATIVE

## 2013-05-17 LAB — LIPID PANEL
Cholesterol: 217 mg/dL — ABNORMAL HIGH (ref 0–200)
HDL: 44 mg/dL (ref >39)

## 2013-05-17 LAB — PSA: PSA: 2.1 ng/mL (ref <=4.00)

## 2013-05-17 MED ORDER — ALBUTEROL SULFATE HFA 108 (90 BASE) MCG/ACT IN AERS: 2.0000 | INHALATION_SPRAY | Freq: Four times a day (QID) | RESPIRATORY_TRACT | Status: DC | PRN

## 2013-05-17 MED ORDER — BENAZEPRIL HCL 40 MG PO TABS: 40.0000 mg | ORAL_TABLET | Freq: Every day | ORAL | Status: DC

## 2013-05-17 MED ORDER — FLUTICASONE-SALMETEROL 250-50 MCG/DOSE IN AEPB: 2.0000 | INHALATION_SPRAY | Freq: Two times a day (BID) | RESPIRATORY_TRACT | Status: DC

## 2013-05-17 NOTE — Progress Notes (Signed)
Subjective:    Patient ID: Frank Howell, male    DOB: 1951/01/09, 62 y.o.   MRN: [id number]  HPI Not taking his cholesterol meds, thinks his diet will cure him but wrong and needs his meds. HTN not to goal. Running 90-100 diastolic 148/104 now Psoriasis is almost totally controlled now.Asthma is doing well.   Review of Systems  Constitutional: Negative.   HENT: Negative.   Eyes: Negative.   Respiratory: Negative.   Cardiovascular: Negative.   Gastrointestinal: Negative.   Endocrine: Negative.   Musculoskeletal: Negative.   Skin: Positive for rash.  Allergic/Immunologic: Positive for environmental allergies.  Neurological: Negative.   Hematological: Negative.   Psychiatric/Behavioral: Negative.        Objective:   Physical Exam  Vitals reviewed. Constitutional: He is oriented to person, place, and time. He appears well-developed and well-nourished. No distress.  HENT:  Right Ear: External ear normal.  Left Ear: External ear normal.  Nose: Nose normal.  Mouth/Throat: Oropharynx is clear and moist.  Eyes: Conjunctivae and EOM are normal. Pupils are equal, round, and reactive to light.  Neck: Normal range of motion. Neck supple. No tracheal deviation present. No thyromegaly present.  Cardiovascular: Normal rate, normal heart sounds and intact distal pulses.   Pulmonary/Chest: Effort normal. He has wheezes. He exhibits no tenderness.  Abdominal: Soft. Bowel sounds are normal. He exhibits no mass. There is no tenderness.  Genitourinary: Rectum normal, prostate normal and penis normal.  Musculoskeletal: Normal range of motion.  Neurological: He is alert and oriented to person, place, and time. He has normal reflexes. No cranial nerve deficit. He exhibits normal muscle tone. Coordination normal.  Skin: Rash noted.  Psychiatric: He has a normal mood and affect. His behavior is normal. Judgment and thought content normal.     Results for orders placed in visit on 05/17/13   POCT CBC      Result Value Range   WBC 5.5  4.6 - 10.2 K/uL   Lymph, poc 1.5  0.6 - 3.4   POC LYMPH PERCENT 27.6  10 - 50 %L   MID (cbc) 0.4  0 - 0.9   POC MID % 7.5  0 - 12 %M   POC Granulocyte 3.6  2 - 6.9   Granulocyte percent 64.9  37 - 80 %G   RBC 5.53  4.69 - 6.13 M/uL   Hemoglobin 16.5  14.1 - 18.1 g/dL   HCT, POC 40.9  81.1 - 53.7 %   MCV 92.4  80 - 97 fL   MCH, POC 29.8  27 - 31.2 pg   MCHC 32.3  31.8 - 35.4 g/dL   RDW, POC 91.4     Platelet Count, POC 163  142 - 424 K/uL   MPV 10.7  0 - 99.8 fL  POCT UA - MICROSCOPIC ONLY      Result Value Range   WBC, Ur, HPF, POC neg     RBC, urine, microscopic neg     Bacteria, U Microscopic neg     Mucus, UA neg     Epithelial cells, urine per micros neg     Crystals, Ur, HPF, POC neg     Casts, Ur, LPF, POC neg     Yeast, UA neg    POCT URINALYSIS DIPSTICK      Result Value Range   Color, UA yellow     Clarity, UA clear     Glucose, UA neg     Bilirubin, UA neg  Ketones, UA neg     Spec Grav, UA 1.010     Blood, UA neg     pH, UA 6.5     Protein, UA neg     Urobilinogen, UA 0.2     Nitrite, UA neg     Leukocytes, UA Negative     hemosure collected ekg ok    Assessment & Plan:  CPE/Asthma/HTN/Lipids

## 2013-05-17 NOTE — Patient Instructions (Addendum)
Asthma Prevention  Cigarette smoke, house dust, molds, pollens, animal dander, certain insects, exercise, and even cold air are all triggers that can cause an asthma attack. Often, no specific triggers are identified.   Take the following measures around your house to reduce attacks:   Avoid cigarette and other smoke. No smoking should be allowed in a home where someone with asthma lives. If smoking is allowed indoors, it should be done in a room with a closed door, and a window should be opened to clear the air. If possible, do not use a wood-burning stove, kerosene heater, or fireplace. Minimize exposure to all sources of smoke, including incense, candles, fires, and fireworks.   Decrease pollen exposure. Keep your windows shut and use central air during the pollen allergy season. Stay indoors with windows closed from late morning to afternoon, if you can. Avoid mowing the lawn if you have grass pollen allergy. Change your clothes and shower after being outside during this time of year.   Remove molds from bathrooms and wet areas. Do this by cleaning the floors with a fungicide or diluted bleach. Avoid using humidifiers, vaporizers, or swamp coolers. These can spread molds through the air. Fix leaky faucets, pipes, or other sources of water that have mold around them.   Decrease house dust exposure. Do this by using bare floors, vacuuming frequently, and changing furnace and air cooler filters frequently. Avoid using feather, wool, or foam bedding. Use polyester pillows and plastic covers over your mattress. Wash bedding weekly in hot water (hotter than 130 F).   Try to get someone else to vacuum for you once or twice a week, if you can. Stay out of rooms while they are being vacuumed and for a short while afterward. If you vacuum, use a dust mask (from a hardware store), a double-layered or microfilter vacuum cleaner bag, or a vacuum cleaner with a HEPA filter.   Avoid perfumes, talcum powder, hair spray,  paints and other strong odors and fumes.   Keep warm-blooded pets (cats, dogs, rodents, birds) outside the home if they are triggers for asthma. If you can't keep the pet outdoors, keep the pet out of your bedroom and other sleeping areas at all times, and keep the door closed. Remove carpets and furniture covered with cloth from your home. If that is not possible, keep the pet away from fabric-covered furniture and carpets.   Eliminate cockroaches. Keep food and garbage in closed containers. Never leave food out. Use poison baits, traps, powders, gels, or paste (for example, boric acid). If a spray is used to kill cockroaches, stay out of the room until the odor goes away.   Decrease indoor humidity to less than 60%. Use an indoor air cleaning device.   Avoid sulfites in foods and beverages. Do not drink beer or wine or eat dried fruit, processed potatoes, or shrimp if they cause asthma symptoms.   Avoid cold air. Cover your nose and mouth with a scarf on cold or windy days.   Avoid aspirin. This is the most common drug causing serious asthma attacks.   If exercise triggers your asthma, ask your caregiver how you should prepare before exercising. (For example, ask if you could use your inhaler 10 minutes before exercising.)   Avoid close contact with people who have a cold or the flu since your asthma symptoms may get worse if you catch the infection from them. Wash your hands thoroughly after touching items that may have been handled by   flu virus, which often makes asthma worse for days to weeks. Also get a pneumonia shot once every five to 10 years. Call your caregiver if you want further information about measures you can take to help prevent asthma attacks. Document Released: 11/08/2005 Document Revised: 01/31/2012 Document Reviewed: 09/16/2009 [facility] Patient Information 2014 [place], [place]. DASH Diet The  DASH diet stands for "Dietary Approaches to Stop Hypertension." It is a healthy eating plan that has been shown to reduce high blood pressure (hypertension) in as little as 14 days, while also possibly providing other significant health benefits. These other health benefits include reducing the risk of breast cancer after menopause and reducing the risk of type 2 diabetes, heart disease, colon cancer, and stroke. Health benefits also include weight loss and slowing kidney failure in patients with chronic kidney disease.  DIET GUIDELINES  Limit salt (sodium). Your diet should contain less than 1500 mg of sodium daily.  Limit refined or processed carbohydrates. Your diet should include mostly whole grains. Desserts and added sugars should be used sparingly.  Include small amounts of heart-healthy fats. These types of fats include nuts, oils, and tub margarine. Limit saturated and trans fats. These fats have been shown to be harmful in the body. CHOOSING FOODS  The following food groups are based on a 2000 calorie diet. See your Registered Dietitian for individual calorie needs. Grains and Grain Products (6 to 8 servings daily)  Eat More Often: Whole-wheat bread, brown rice, whole-grain or wheat pasta, quinoa, popcorn without added fat or salt (air popped).  Eat Less Often: White bread, white pasta, white rice, cornbread. Vegetables (4 to 5 servings daily)  Eat More Often: Fresh, frozen, and canned vegetables. Vegetables may be raw, steamed, roasted, or grilled with a minimal amount of fat.  Eat Less Often/Avoid: Creamed or fried vegetables. Vegetables in a cheese sauce. Fruit (4 to 5 servings daily)  Eat More Often: All fresh, canned (in natural juice), or frozen fruits. Dried fruits without added sugar. One hundred percent fruit juice ( cup [237 mL] daily).  Eat Less Often: Dried fruits with added sugar. Canned fruit in light or heavy syrup. [employer], Fish, and Poultry (2 servings or less  daily. One serving is 3 to 4 oz [85-114 g]).  Eat More Often: Ninety percent or leaner ground beef, tenderloin, sirloin. Round cuts of beef, chicken breast, [place] breast. All fish. Grill, bake, or broil your meat. Nothing should be fried.  Eat Less Often/Avoid: Fatty cuts of meat, [place], or chicken leg, thigh, or wing. Fried cuts of meat or fish. Dairy (2 to 3 servings)  Eat More Often: Low-fat or fat-free milk, low-fat plain or light yogurt, reduced-fat or part-skim cheese.  Eat Less Often/Avoid: Milk (whole, 2%).Whole milk yogurt. Full-fat cheeses. Nuts, Seeds, and Legumes (4 to 5 servings per week)  Eat More Often: All without added salt.  Eat Less Often/Avoid: Salted nuts and seeds, canned beans with added salt. Fats and Sweets (limited)  Eat More Often: Vegetable oils, tub margarines without trans fats, sugar-free gelatin. Mayonnaise and salad dressings.  Eat Less Often/Avoid: Coconut oils, palm oils, butter, stick margarine, cream, half and half, cookies, candy, pie. FOR MORE INFORMATION The Dash Diet Eating Plan: www.dashdiet.org Document Released: 10/28/2011 Document Revised: 01/31/2012 Document Reviewed: 10/28/2011 [facility] Patient Information 2014 [place], [place].

## 2013-05-21 ENCOUNTER — Telehealth: Payer: Self-pay | Admitting: Family Medicine

## 2013-05-21 NOTE — Telephone Encounter (Signed)
Spoke with patient gave him lab reults

## 2013-05-22 ENCOUNTER — Other Ambulatory Visit: Payer: Self-pay | Admitting: Internal Medicine

## 2013-09-15 ENCOUNTER — Other Ambulatory Visit: Payer: Self-pay | Admitting: Physician Assistant

## 2013-09-27 ENCOUNTER — Other Ambulatory Visit: Payer: Self-pay

## 2013-11-16 ENCOUNTER — Other Ambulatory Visit: Payer: Self-pay | Admitting: Emergency Medicine

## 2013-11-16 ENCOUNTER — Ambulatory Visit (INDEPENDENT_AMBULATORY_CARE_PROVIDER_SITE_OTHER): Payer: BC Managed Care – PPO | Admitting: Emergency Medicine

## 2013-11-16 VITALS — BP 140/98 | HR 82 | Temp 97.4°F | Resp 16 | Ht 67.5 in | Wt 163.0 lb

## 2013-11-16 DIAGNOSIS — E782 Mixed hyperlipidemia: Secondary | ICD-10-CM

## 2013-11-16 DIAGNOSIS — N529 Male erectile dysfunction, unspecified: Secondary | ICD-10-CM

## 2013-11-16 DIAGNOSIS — I10 Essential (primary) hypertension: Secondary | ICD-10-CM

## 2013-11-16 LAB — POCT URINALYSIS DIPSTICK
Bilirubin, UA: NEGATIVE
Glucose, UA: NEGATIVE
Leukocytes, UA: NEGATIVE
Nitrite, UA: NEGATIVE
Urobilinogen, UA: 0.2

## 2013-11-16 LAB — COMPREHENSIVE METABOLIC PANEL
ALT: 18 U/L (ref 0–53)
AST: 19 U/L (ref 0–37)
Albumin: 4.3 g/dL (ref 3.5–5.2)
Alkaline Phosphatase: 92 U/L (ref 39–117)
BUN: 12 mg/dL (ref 6–23)
Calcium: 9.5 mg/dL (ref 8.4–10.5)
Chloride: 102 mEq/L (ref 96–112)
Potassium: 4.7 mEq/L (ref 3.5–5.3)

## 2013-11-16 LAB — POCT UA - MICROSCOPIC ONLY
Casts, Ur, LPF, POC: NEGATIVE
Mucus, UA: NEGATIVE
Yeast, UA: NEGATIVE

## 2013-11-16 LAB — TESTOSTERONE: Testosterone: 563 ng/dL (ref 300–890)

## 2013-11-16 LAB — LIPID PANEL
HDL: 58 mg/dL (ref >39)
LDL Cholesterol: 138 mg/dL — ABNORMAL HIGH (ref 0–99)
Total CHOL/HDL Ratio: 3.8 Ratio

## 2013-11-16 LAB — POCT CBC
Granulocyte percent: 71.1 %G (ref 37–80)
MID (cbc): 0.6 (ref 0–0.9)
MPV: 9.9 fL (ref 0–99.8)
POC Granulocyte: 5 (ref 2–6.9)
POC LYMPH PERCENT: 20.9 %L (ref 10–50)
POC MID %: 8 %M (ref 0–12)
Platelet Count, POC: 174 10*3/uL (ref 142–424)
RBC: 5.58 M/uL (ref 4.69–6.13)
RDW, POC: 14.8 %

## 2013-11-16 MED ORDER — SILDENAFIL CITRATE 100 MG PO TABS: 100.0000 mg | ORAL_TABLET | Freq: Every day | ORAL | Status: DC | PRN

## 2013-11-16 MED ORDER — VITAMIN D (ERGOCALCIFEROL) 1.25 MG (50000 UNIT) PO CAPS: 50000.0000 [IU] | ORAL_CAPSULE | ORAL | Status: DC

## 2013-11-16 MED ORDER — PRAVASTATIN SODIUM 40 MG PO TABS: 40.0000 mg | ORAL_TABLET | Freq: Every day | ORAL | Status: DC

## 2013-11-16 NOTE — Progress Notes (Signed)
[facility] [address], [place] [address] [phone]  Date:  11/16/2013   Name:  Frank Howell   DOB:  1951/10/01   MRN:  [id number]  PCP:  Tally Due, MD    Chief Complaint: Chills, Labs Only and Erectile Dysfunction   History of Present Illness:  Frank Howell is a 62 y.o. very pleasant male patient who presents with the following:  Noncompliant.  Had labs six months ago and was found to have elevated cholesterol and LDL.  Never followed up.  Now concerned about ED and elevated cholesterol.  Says has experienced "chills" over past six months.  No fever.  No history of malaria.  No nausea or vomiting. No stool change.  No improvement with over the counter medications or other home remedies. Denies other complaint or health concern today.   Patient Active Problem List   Diagnosis Date Noted  . Asthma 01/10/2012  . Hypertension 01/10/2012  . Hyperlipidemia 01/10/2012  . GERD 01/20/2010  . FECAL OCCULT BLOOD 01/20/2010    Past Medical History  Diagnosis Date  . Abnormal liver function   . Dermatitis   . GERD (gastroesophageal reflux disease)   . Hypertension   . Psoriasis   . Allergy   . Asthma   . Hyperlipidemia     No past surgical history on file.  History  Substance Use Topics  . Smoking status: Never Smoker   . Smokeless tobacco: Never Used  . Alcohol Use: Yes     Comment: occasionally    Family History  Problem Relation Age of Onset  . Asthma Mother   . Hypertension Sister   . Hypertension Brother     Allergies  Allergen Reactions  . Penicillins     REACTION: skin irritation    Medication list has been reviewed and updated.  Current Outpatient Prescriptions on File Prior to Visit  Medication Sig Dispense Refill  . albuterol (PROVENTIL HFA;VENTOLIN HFA) 108 (90 BASE) MCG/ACT inhaler Inhale 2 puffs into the lungs every 6 (six) hours as needed for wheezing.  1 Inhaler  6  . benazepril (LOTENSIN) 40 MG tablet Take  1 tablet (40 mg total) by mouth daily.  90 tablet  3  . Fluticasone-Salmeterol (ADVAIR DISKUS) 250-50 MCG/DOSE AEPB Inhale 2 puffs into the lungs 2 (two) times daily.  60 each  6  . amLODipine (NORVASC) 10 MG tablet Take 1 tablet (10 mg total) by mouth daily. NEED VISIT/LABS!!  30 tablet  2  . amLODipine-benazepril (LOTREL) 10-20 MG per capsule Take 1 capsule by mouth daily.      . benazepril-hydrochlorthiazide (LOTENSIN HCT) 20-12.5 MG per tablet Take 1 tablet by mouth daily. NEED VISIT/LABS!!  30 tablet  0  . benzonatate (TESSALON) 100 MG capsule Take 1-2 capsules 3 times a day for cough  40 capsule  0  . omeprazole (PRILOSEC) 40 MG capsule Take 1 capsule (40 mg total) by mouth daily.  30 capsule  1  . pravastatin (PRAVACHOL) 40 MG tablet Take 1 tablet (40 mg total) by mouth daily. Needs office visit  30 tablet  0  . sildenafil (VIAGRA) 100 MG tablet Take 0.5-1 tablets (50-100 mg total) by mouth daily as needed for erectile dysfunction.  12 tablet  3   No current facility-administered medications on file prior to visit.    Review of Systems:  As per HPI, otherwise negative.    Physical Examination: Filed Vitals:   11/16/13 0927  BP: 140/98  Pulse: 82  Temp: 97.4 F (36.3 C)  Resp: 16   Filed Vitals:   11/16/13 0927  Height: 5' 7.5" (1.715 m)  Weight: 163 lb (73.936 kg)   Body mass index is 25.14 kg/(m^2). Ideal Body Weight: Weight in (lb) to have BMI = 25: 161.7  GEN: WDWN, NAD, Non-toxic, A & O x 3 HEENT: Atraumatic, Normocephalic. Neck supple. No masses, No LAD. Ears and Nose: No external deformity. CV: RRR, No M/G/R. No JVD. No thrill. No extra heart sounds. PULM: CTA B, no wheezes, crackles, rhonchi. No retractions. No resp. distress. No accessory muscle use. ABD: S, NT, ND, +BS. No rebound. No HSM. EXTR: No c/c/e NEURO Normal gait.  PSYCH: Normally interactive. Conversant. Not depressed or anxious appearing.  Calm demeanor.    Assessment and  Plan: Hyperlipidemia ED Labs  Signed,  Phillips Odor, MD   Refused TBST.  Says has been tested repeatedly.  Results for orders placed in visit on 11/16/13  POCT CBC      Result Value Range   WBC 7.1  4.6 - 10.2 K/uL   Lymph, poc 1.5  0.6 - 3.4   POC LYMPH PERCENT 20.9  10 - 50 %L   MID (cbc) 0.6  0 - 0.9   POC MID % 8.0  0 - 12 %M   POC Granulocyte 5.0  2 - 6.9   Granulocyte percent 71.1  37 - 80 %G   RBC 5.58  4.69 - 6.13 M/uL   Hemoglobin 16.7  14.1 - 18.1 g/dL   HCT, POC 16.1  09.6 - 53.7 %   MCV 93.1  80 - 97 fL   MCH, POC 29.9  27 - 31.2 pg   MCHC 32.2  31.8 - 35.4 g/dL   RDW, POC 04.5     Platelet Count, POC 174  142 - 424 K/uL   MPV 9.9  0 - 99.8 fL  POCT UA - MICROSCOPIC ONLY      Result Value Range   WBC, Ur, HPF, POC 0-2     RBC, urine, microscopic neg     Bacteria, U Microscopic trace     Mucus, UA neg     Epithelial cells, urine per micros neg     Crystals, Ur, HPF, POC neg     Casts, Ur, LPF, POC neg     Yeast, UA neg    POCT URINALYSIS DIPSTICK      Result Value Range   Color, UA yellow     Clarity, UA clear     Glucose, UA neg     Bilirubin, UA neg     Ketones, UA neg     Spec Grav, UA 1.015     Blood, UA neg     pH, UA 7.0     Protein, UA neg     Urobilinogen, UA 0.2     Nitrite, UA neg     Leukocytes, UA Negative

## 2013-11-16 NOTE — Patient Instructions (Signed)
Erectile Dysfunction Erectile dysfunction is the inability to get or sustain a good enough erection to have sexual intercourse. Erectile dysfunction may involve:  Inability to get an erection.  Lack of enough hardness to allow penetration.  Loss of the erection before sex is finished.  Premature ejaculation. CAUSES  Certain drugs, such as:  Pain relievers.  Antihistamines.  Antidepressants.  Blood pressure medicines.  Water pills (diuretics).  Ulcer medicines.  Muscle relaxants.  Illegal drugs.  Excessive drinking.  Psychological causes, such as:  Anxiety.  Depression.  Sadness.  Exhaustion.  Performance fear.  Stress.  Physical causes, such as:  Artery problems. This may include diabetes, smoking, liver disease, or atherosclerosis.  High blood pressure.  Hormonal problems, such as low testosterone.  Obesity.  Nerve problems. This may include back or pelvic injuries, diabetes mellitus, multiple sclerosis, or Parkinson disease. SYMPTOMS  Inability to get an erection.  Lack of enough hardness to allow penetration.  Loss of the erection before sex is finished.  Premature ejaculation.  Normal erections at some times, but with frequent unsatisfactory episodes.  Orgasms that are not satisfactory in sensation or frequency.  Low sexual satisfaction in either partner because of erection problems.  A curved penis occurring with erection. The curve may cause pain or may be too curved to allow for intercourse.  Never having nighttime erections. DIAGNOSIS Your caregiver can often diagnose this condition by:  Performing a physical exam to find other diseases or specific problems with the penis.  Asking you detailed questions about the problem.  Performing blood tests to check for diabetes mellitus or to measure hormone levels.  Performing urine tests to find other underlying health conditions.  Performing an ultrasound exam to check for  scarring.  Performing a test to check blood flow to the penis.  Doing a sleep study at home to measure nighttime erections. TREATMENT   You may be prescribed medicines by mouth.  You may be given medicine injections into the penis.  You may be prescribed a vacuum pump with a ring.  Penile implant surgery may be performed. You may receive:  An inflatable implant.  A semirigid implant.  Blood vessel surgery may be performed. HOME CARE INSTRUCTIONS  If you are prescribed oral medicine, you should take the medicine as prescribed. Do not increase the dosage without first discussing it with your physician.  If you are using self-injections, be careful to avoid any veins that are on the surface of the penis. Apply pressure to the injection site for 5 minutes.  If you are using a vacuum pump, make sure you have read the instructions before using it. Discuss any questions with your physician before taking the pump home. SEEK MEDICAL CARE IF:  You experience pain that is not responsive to the pain medicine you have been prescribed.  You experience nausea or vomiting. SEEK IMMEDIATE MEDICAL CARE IF:   When taking oral or injectable medications, you experience an erection that lasts longer than 4 hours. If your physician is unavailable, go to the nearest emergency room for evaluation. An erection that lasts much longer than 4 hours can result in permanent damage to your penis.  You have pain that is severe.  You develop redness, severe pain, or severe swelling of your penis.  You have redness spreading up into your groin or lower abdomen.  You are unable to pass your urine. Document Released: 11/05/2000 Document Revised: 07/11/2013 Document Reviewed: 04/12/2013 [facility] Patient Information 2014 [place], [place]. Hypertension As your heart  beats, it forces blood through your arteries. This force is your blood pressure. If the pressure is too high, it is called hypertension (HTN) or  high blood pressure. HTN is dangerous because you may have it and not know it. High blood pressure may mean that your heart has to work harder to pump blood. Your arteries may be narrow or stiff. The extra work puts you at risk for heart disease, stroke, and other problems.  Blood pressure consists of two numbers, a higher number over a lower, 110/72, for example. It is stated as "110 over 72." The ideal is below 120 for the top number (systolic) and under 80 for the bottom (diastolic). Write down your blood pressure today. You should pay close attention to your blood pressure if you have certain conditions such as:  Heart failure.  Prior heart attack.  Diabetes  Chronic kidney disease.  Prior stroke.  Multiple risk factors for heart disease. To see if you have HTN, your blood pressure should be measured while you are seated with your arm held at the level of the heart. It should be measured at least twice. A one-time elevated blood pressure reading (especially in the Emergency Department) does not mean that you need treatment. There may be conditions in which the blood pressure is different between your right and left arms. It is important to see your caregiver soon for a recheck. Most people have essential hypertension which means that there is not a specific cause. This type of high blood pressure may be lowered by changing lifestyle factors such as:  Stress.  Smoking.  Lack of exercise.  Excessive weight.  Drug/tobacco/alcohol use.  Eating less salt. Most people do not have symptoms from high blood pressure until it has caused damage to the body. Effective treatment can often prevent, delay or reduce that damage. TREATMENT  When a cause has been identified, treatment for high blood pressure is directed at the cause. There are a large number of medications to treat HTN. These fall into several categories, and your caregiver will help you select the medicines that are best for you.  Medications may have side effects. You should review side effects with your caregiver. If your blood pressure stays high after you have made lifestyle changes or started on medicines,   Your medication(s) may need to be changed.  Other problems may need to be addressed.  Be certain you understand your prescriptions, and know how and when to take your medicine.  Be sure to follow up with your caregiver within the time frame advised (usually within two weeks) to have your blood pressure rechecked and to review your medications.  If you are taking more than one medicine to lower your blood pressure, make sure you know how and at what times they should be taken. Taking two medicines at the same time can result in blood pressure that is too low. SEEK IMMEDIATE MEDICAL CARE IF:  You develop a severe headache, blurred or changing vision, or confusion.  You have unusual weakness or numbness, or a faint feeling.  You have severe chest or abdominal pain, vomiting, or breathing problems. MAKE SURE YOU:   Understand these instructions.  Will watch your condition.  Will get help right away if you are not doing well or get worse. Document Released: 11/08/2005 Document Revised: 01/31/2012 Document Reviewed: 06/28/2008 [facility] Patient Information 2014 [place], [place]. Lipid Profile The lipid profile is a group of tests that are often ordered together to determine risk of  coronary heart disease. The tests that make up a lipid profile are tests that have been shown to be good indicators of whether someone is likely to have a heart attack or stroke caused by blockage of blood vessels (hardening of the arteries).  The lipid profile includes total cholesterol, HDL-cholesterol (often called good cholesterol), LDL-cholesterol (often called bad cholesterol), and triglycerides. Sometimes the report will include additional calculated values such as HDL/Cholesterol ratio or a risk score based on lipid profile  results, age, sex, and other risk factors.  Treatment is based on your overall risk of coronary heart disease. A target LDL is identified. If your LDL is above the target value, you will be treated. Your target LDL value is:   LDL less than 100 mg/dL (2.13 mmol/L) if you have heart disease or diabetes.  LDL less than 130 mg/dL (0.86 mmol/L) if you have 2 or more risk factors.  LDL less than 160 mg/dL (5.78 mmol/L) if you have 0 or 1 risk factor. Ranges for normal findings may vary among different laboratories and hospitals. You should always check with your doctor after having lab work or other tests done to discuss the meaning of your test results and whether your values are considered within normal limits. MEANING OF TEST  Your caregiver will go over the test results with you and discuss the importance and meaning of your results, as well as treatment options and the need for additional tests if necessary. OBTAINING THE TEST RESULTS It is your responsibility to obtain your test results. Ask the lab or department performing the test when and how you will get your results. Document Released: 12/11/2004 Document Revised: 01/31/2012 Document Reviewed: 10/19/2008 [facility] Patient Information 2014 [place], [place].

## 2014-01-12 ENCOUNTER — Other Ambulatory Visit: Payer: Self-pay | Admitting: Physician Assistant

## 2014-02-08 ENCOUNTER — Ambulatory Visit (INDEPENDENT_AMBULATORY_CARE_PROVIDER_SITE_OTHER): Payer: BC Managed Care – PPO | Admitting: Family Medicine

## 2014-02-08 VITALS — BP 130/92 | HR 67 | Temp 98.1°F | Resp 18 | Ht 68.0 in | Wt 159.0 lb

## 2014-02-08 DIAGNOSIS — K6389 Other specified diseases of intestine: Secondary | ICD-10-CM

## 2014-02-08 DIAGNOSIS — I10 Essential (primary) hypertension: Secondary | ICD-10-CM

## 2014-02-08 DIAGNOSIS — K639 Disease of intestine, unspecified: Secondary | ICD-10-CM

## 2014-02-08 DIAGNOSIS — E785 Hyperlipidemia, unspecified: Secondary | ICD-10-CM

## 2014-02-08 LAB — LIPID PANEL
Cholesterol: 207 mg/dL — ABNORMAL HIGH (ref 0–200)
HDL: 57 mg/dL (ref >39)
LDL CALC: 135 mg/dL — AB (ref 0–99)
Total CHOL/HDL Ratio: 3.6 Ratio
Triglycerides: 77 mg/dL (ref <150)
VLDL: 15 mg/dL (ref 0–40)

## 2014-02-08 LAB — COMPREHENSIVE METABOLIC PANEL
ALBUMIN: 4.1 g/dL (ref 3.5–5.2)
ALK PHOS: 77 U/L (ref 39–117)
ALT: 17 U/L (ref 0–53)
AST: 18 U/L (ref 0–37)
BUN: 11 mg/dL (ref 6–23)
CO2: 30 meq/L (ref 19–32)
Calcium: 9.2 mg/dL (ref 8.4–10.5)
Chloride: 104 mEq/L (ref 96–112)
Creat: 1.34 mg/dL (ref 0.50–1.35)
Glucose, Bld: 96 mg/dL (ref 70–99)
Potassium: 4.5 mEq/L (ref 3.5–5.3)
SODIUM: 141 meq/L (ref 135–145)
TOTAL PROTEIN: 7.4 g/dL (ref 6.0–8.3)
Total Bilirubin: 1.3 mg/dL — ABNORMAL HIGH (ref 0.2–1.2)

## 2014-02-08 MED ORDER — BENAZEPRIL-HYDROCHLOROTHIAZIDE 20-12.5 MG PO TABS: 1.0000 | ORAL_TABLET | Freq: Every day | ORAL | Status: DC

## 2014-02-08 MED ORDER — ROSUVASTATIN CALCIUM 20 MG PO TABS: 20.0000 mg | ORAL_TABLET | Freq: Every day | ORAL | Status: DC

## 2014-02-08 MED ORDER — AMLODIPINE BESYLATE 10 MG PO TABS: 10.0000 mg | ORAL_TABLET | Freq: Every day | ORAL | Status: DC

## 2014-02-08 NOTE — Patient Instructions (Signed)
Continue taking amlodipine, but take it in the morning  Resumed taking his benazepril/HCT 20/12.5. If your blood pressures continue to stay high we will have to make a change  Stopped the pravastatin and begin taking the Crestor 20 mg one daily  Continue regular exercise and watching your diet  Return in about July for a followup  Try taking a dose or 2 of MiraLax and see if cleaning out your colon a little bit more will help relieve the discomfort you have in the splenic flexure.

## 2014-02-08 NOTE — Progress Notes (Signed)
Subjective: Patient is here with a number of complaints. He knows his blood pressures keep running high. He would like to be controlled without taking another medication. His cholesterol also runs high. He would like to do at all with exercise and watching his eating, but he already has had a good weight and is exercising regularly. He has been taking a lot of supplements, but I explained that really most of the supplements probably not necessary are not going to better than the prescriptions. He has fears about kidney disease. He's been doing a lot of reading of things and has been taking various supplements. He has a problem with intermittent and and long-term left upper quadrant abdominal pain. This is just a mild nagging. He has had a colonoscopy 3 years ago which was normal.  Objective: Acute distress. Chest clear. Heart regular without murmurs. Abdomen soft nontender.  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 Long discussion.

## 2014-02-08 NOTE — Addendum Note (Signed)
Addended by: Lenola Lockner H on: 02/08/2014 02:07 PM   Modules accepted: Orders

## 2014-02-09 ENCOUNTER — Encounter: Payer: Self-pay | Admitting: Family Medicine

## 2014-02-10 ENCOUNTER — Encounter: Payer: Self-pay | Admitting: Internal Medicine

## 2014-03-09 ENCOUNTER — Other Ambulatory Visit: Payer: Self-pay | Admitting: Internal Medicine

## 2014-04-29 ENCOUNTER — Other Ambulatory Visit: Payer: Self-pay | Admitting: Family Medicine

## 2014-05-10 ENCOUNTER — Ambulatory Visit (INDEPENDENT_AMBULATORY_CARE_PROVIDER_SITE_OTHER): Payer: BC Managed Care – PPO | Admitting: Physician Assistant

## 2014-05-10 VITALS — BP 126/82 | HR 72 | Temp 97.7°F | Resp 14 | Ht 68.0 in | Wt 162.3 lb

## 2014-05-10 DIAGNOSIS — H00013 Hordeolum externum right eye, unspecified eyelid: Secondary | ICD-10-CM

## 2014-05-10 DIAGNOSIS — H00019 Hordeolum externum unspecified eye, unspecified eyelid: Secondary | ICD-10-CM

## 2014-05-10 MED ORDER — POLYMYXIN B-TRIMETHOPRIM 10000-0.1 UNIT/ML-% OP SOLN: 1.0000 [drp] | OPHTHALMIC | Status: DC

## 2014-05-10 NOTE — Progress Notes (Signed)
   Subjective:    Patient ID: Frank Howell, male    DOB: 09-Sep-1951, 63 y.o.   MRN: [id number]  HPI  63 year old male presents for evaluation of 1 week history of right eyelid swelling, pain, and drainage.  He states he first noticed a small bump on the upper eyelid that has continued to get bigger. He has been using warm compresses and lubricating drops that do not seem to be helping.  He has noticed slight white discharge.  Denies vision changes, headache, dizziness, or pain in the eye. Does have slight photophobia.   Sees an eye doctor annually. Due for next visit in July.    Review of Systems  Constitutional: Negative for fever and chills.  Eyes: Positive for photophobia and discharge. Negative for pain, redness, itching and visual disturbance.  Gastrointestinal: Negative for nausea and vomiting.  Neurological: Negative for dizziness and headaches.       Objective:   Physical Exam  Constitutional: He is oriented to person, place, and time. He appears well-developed and well-nourished.  HENT:  Head: Normocephalic and atraumatic.  Right Ear: Hearing, tympanic membrane, external ear and ear canal normal.  Left Ear: Hearing, tympanic membrane, external ear and ear canal normal.  Eyes: Conjunctivae and EOM are normal. Pupils are equal, round, and reactive to light. Right eye exhibits hordeolum.  Cardiovascular: Normal rate.   Pulmonary/Chest: Effort normal.  Neurological: He is alert and oriented to person, place, and time.  Psychiatric: He has a normal mood and affect. His behavior is normal. Judgment and thought content normal.          Assessment & Plan:  Hordeolum externum, right  Will treat with polytrim drops as directed. Patient declined oral antibiotics Continue warm compresses 2-3 times daily. Recommend f/u with ophthalmology if symptoms fail to improve in 7 days, sooner if worse.

## 2014-05-10 NOTE — Patient Instructions (Signed)
Sty A sty (hordeolum) is an infection of a gland in the eyelid located at the base of the eyelash. A sty may develop a white or yellow head of pus. It can be puffy (swollen). Usually, the sty will burst and pus will come out on its own. They do not leave lumps in the eyelid once they drain. A sty is often confused with another form of cyst of the eyelid called a chalazion. Chalazions occur within the eyelid and not on the edge where the bases of the eyelashes are. They often are red, sore and then form firm lumps in the eyelid. CAUSES   Germs (bacteria).  Lasting (chronic) eyelid inflammation. SYMPTOMS   Tenderness, redness and swelling along the edge of the eyelid at the base of the eyelashes.  Sometimes, there is a white or yellow head of pus. It may or may not drain. DIAGNOSIS  An ophthalmologist will be able to distinguish between a sty and a chalazion and treat the condition appropriately.  TREATMENT   Styes are typically treated with warm packs (compresses) until drainage occurs.  In rare cases, medicines that kill germs (antibiotics) may be prescribed. These antibiotics may be in the form of drops, cream or pills.  If a hard lump has formed, it is generally necessary to do a small incision and remove the hardened contents of the cyst in a minor surgical procedure done in the office.  In suspicious cases, your caregiver may send the contents of the cyst to the lab to be certain that it is not a rare, but dangerous form of cancer of the glands of the eyelid. HOME CARE INSTRUCTIONS   Wash your hands often and dry them with a clean towel. Avoid touching your eyelid. This may spread the infection to other parts of the eye.  Apply heat to your eyelid for 10 to 20 minutes, several times a day, to ease pain and help to heal it faster.  Do not squeeze the sty. Allow it to drain on its own. Wash your eyelid carefully 3 to 4 times per day to remove any pus. SEEK IMMEDIATE MEDICAL CARE IF:    Your eye becomes painful or puffy (swollen).  Your vision changes.  Your sty does not drain by itself within 3 days.  Your sty comes back within a short period of time, even with treatment.  You have redness (inflammation) around the eye.  You have a fever. Document Released: 08/18/2005 Document Revised: 01/31/2012 Document Reviewed: 04/22/2009 [facility] Patient Information 2015 [place], [place]. This information is not intended to replace advice given to you by your health care provider. Make sure you discuss any questions you have with your health care provider.

## 2014-05-29 ENCOUNTER — Telehealth: Payer: Self-pay

## 2014-05-29 NOTE — Telephone Encounter (Signed)
Mickel Baas from [facility] called. Stated she needs most recent PSA results for patient before his appt tomorrow. CB# [phone]   Fax: [phone]

## 2014-07-30 ENCOUNTER — Other Ambulatory Visit: Payer: Self-pay | Admitting: Internal Medicine

## 2014-10-09 ENCOUNTER — Other Ambulatory Visit: Payer: Self-pay | Admitting: Internal Medicine

## 2014-10-22 ENCOUNTER — Other Ambulatory Visit: Payer: Self-pay | Admitting: Emergency Medicine

## 2014-11-19 ENCOUNTER — Other Ambulatory Visit: Payer: Self-pay | Admitting: Internal Medicine

## 2014-12-01 ENCOUNTER — Other Ambulatory Visit: Payer: Self-pay | Admitting: Internal Medicine

## 2014-12-03 ENCOUNTER — Ambulatory Visit (INDEPENDENT_AMBULATORY_CARE_PROVIDER_SITE_OTHER): Payer: Medicare Other | Admitting: Family Medicine

## 2014-12-03 VITALS — BP 130/80 | HR 78 | Temp 98.2°F | Resp 16 | Ht 68.0 in | Wt 161.0 lb

## 2014-12-03 DIAGNOSIS — Z1329 Encounter for screening for other suspected endocrine disorder: Secondary | ICD-10-CM

## 2014-12-03 DIAGNOSIS — I10 Essential (primary) hypertension: Secondary | ICD-10-CM

## 2014-12-03 DIAGNOSIS — E559 Vitamin D deficiency, unspecified: Secondary | ICD-10-CM

## 2014-12-03 DIAGNOSIS — Z0000 Encounter for general adult medical examination without abnormal findings: Secondary | ICD-10-CM

## 2014-12-03 DIAGNOSIS — Z125 Encounter for screening for malignant neoplasm of prostate: Secondary | ICD-10-CM

## 2014-12-03 DIAGNOSIS — E785 Hyperlipidemia, unspecified: Secondary | ICD-10-CM

## 2014-12-03 DIAGNOSIS — Z860100 Personal history of colon polyps, unspecified: Secondary | ICD-10-CM

## 2014-12-03 DIAGNOSIS — Z2821 Immunization not carried out because of patient refusal: Secondary | ICD-10-CM

## 2014-12-03 LAB — COMPLETE METABOLIC PANEL WITH GFR
ALT: 13 U/L (ref 0–53)
AST: 16 U/L (ref 0–37)
Alkaline Phosphatase: 90 U/L (ref 39–117)
BUN: 12 mg/dL (ref 6–23)
Chloride: 102 mEq/L (ref 96–112)
Creat: 1.46 mg/dL — ABNORMAL HIGH (ref 0.50–1.35)
Glucose, Bld: 91 mg/dL (ref 70–99)
Potassium: 3.8 mEq/L (ref 3.5–5.3)
Sodium: 140 mEq/L (ref 135–145)
Total Protein: 7.2 g/dL (ref 6.0–8.3)

## 2014-12-03 LAB — LIPID PANEL
Cholesterol: 175 mg/dL (ref 0–200)
HDL: 50 mg/dL (ref >39)
LDL Cholesterol: 102 mg/dL — ABNORMAL HIGH (ref 0–99)
Total CHOL/HDL Ratio: 3.5 Ratio
Triglycerides: 117 mg/dL (ref <150)
VLDL: 23 mg/dL (ref 0–40)

## 2014-12-03 LAB — CBC
HCT: 48.3 % (ref 39.0–52.0)
Hemoglobin: 16.2 g/dL (ref 13.0–17.0)
MCH: 29.2 pg (ref 26.0–34.0)
MCHC: 33.5 g/dL (ref 30.0–36.0)
MCV: 87.2 fL (ref 78.0–100.0)
MPV: 11.6 fL (ref 8.6–12.4)
Platelets: 158 10*3/uL (ref 150–400)
RBC: 5.54 MIL/uL (ref 4.22–5.81)
RDW: 14.1 % (ref 11.5–15.5)
WBC: 4.5 10*3/uL (ref 4.0–10.5)

## 2014-12-03 LAB — POCT UA - MICROSCOPIC ONLY
Casts, Ur, LPF, POC: NEGATIVE
Crystals, Ur, HPF, POC: NEGATIVE
Mucus, UA: NEGATIVE
RBC, urine, microscopic: NEGATIVE
Yeast, UA: NEGATIVE

## 2014-12-03 LAB — VITAMIN D 25 HYDROXY (VIT D DEFICIENCY, FRACTURES): Vit D, 25-Hydroxy: 52 ng/mL (ref 30–100)

## 2014-12-03 LAB — COMPLETE METABOLIC PANEL WITHOUT GFR
Albumin: 3.9 g/dL (ref 3.5–5.2)
CO2: 32 meq/L (ref 19–32)
Calcium: 9.3 mg/dL (ref 8.4–10.5)
GFR, Est African American: 58 mL/min — ABNORMAL LOW
GFR, Est Non African American: 50 mL/min — ABNORMAL LOW
Total Bilirubin: 1.1 mg/dL (ref 0.2–1.2)

## 2014-12-03 LAB — TSH: TSH: 2.227 u[IU]/mL (ref 0.350–4.500)

## 2014-12-03 MED ORDER — AMLODIPINE BESYLATE 10 MG PO TABS: 10.0000 mg | ORAL_TABLET | Freq: Every day | ORAL | Status: DC

## 2014-12-03 MED ORDER — VITAMIN D (ERGOCALCIFEROL) 1.25 MG (50000 UNIT) PO CAPS: ORAL_CAPSULE | ORAL | Status: DC

## 2014-12-03 MED ORDER — BENAZEPRIL-HYDROCHLOROTHIAZIDE 20-12.5 MG PO TABS: 1.0000 | ORAL_TABLET | Freq: Every day | ORAL | Status: DC

## 2014-12-03 MED ORDER — FLUTICASONE-SALMETEROL 250-50 MCG/DOSE IN AEPB: 2.0000 | INHALATION_SPRAY | Freq: Two times a day (BID) | RESPIRATORY_TRACT | Status: DC

## 2014-12-03 NOTE — Progress Notes (Signed)
Chief Complaint:  Chief Complaint  Patient presents with  . Annual Exam    HPI: Frank Howell is a 64 y.o. male who is here for annual exam  Colonscopy was in 2011-he was recommended to have another one in 5 years due to polyps. He is not sure he can afford to do this since ahd to pay out of pocket for colonscopy.  UTD PPSV 23 vaccine 2013, does not want Prevnar 13 at this time HTN-is stable, he is ompliant and no SEs. Sometimes he has mild HA but he does not have CP or dizziness. 130/80s  BP Readings from Last 3 Encounters:  12/03/14 130/80  05/10/14 126/82  02/08/14 130/92   Hyperlipidemia-he is not compliant with his lipid meds, he does nto want to take meds, he is using flax seeds, changing his diet and also eating oatmeal.  Wt Readings from Last 3 Encounters:  12/03/14 161 lb (73.029 kg)  05/10/14 162 lb 4.8 oz (73.619 kg)  02/08/14 159 lb (72.122 kg)   No BPH sxs-he urinates 2-3 times per night, due to intake of water. He dribbling, increase urgency, he denies incomplete emptying No hx of prostate cancer  Past Medical History  Diagnosis Date  . Abnormal liver function   . Dermatitis   . GERD (gastroesophageal reflux disease)   . Hypertension   . Psoriasis   . Allergy   . Asthma   . Hyperlipidemia    History reviewed. No pertinent past surgical history. History   Social History  . Marital Status: Married    Spouse Name: N/A    Number of Children: N/A  . Years of Education: N/A   Social History Main Topics  . Smoking status: Never Smoker   . Smokeless tobacco: Never Used  . Alcohol Use: Yes     Comment: occasionally  . Drug Use: No  . Sexual Activity: None   Other Topics Concern  . None   Social History Narrative   Family History  Problem Relation Age of Onset  . Asthma Mother   . Hypertension Sister   . Hypertension Brother    Allergies  Allergen Reactions  . Penicillins     REACTION: skin irritation   Prior to Admission  medications   Medication Sig Start Date End Date Taking? Authorizing Provider  ADVAIR DISKUS 250-50 MCG/DOSE AEPB INHALE 2 PUFFS INTO THE LUNGS 2 (TWO) TIMES DAILY. PATIENT NEEDS OFFICE VISIT FOR ADDITIONAL REFILLS 10/09/14  Yes Chelle S Jeffery, PA-C  albuterol (PROVENTIL HFA;VENTOLIN HFA) 108 (90 BASE) MCG/ACT inhaler Inhale 2 puffs into the lungs every 6 (six) hours as needed for wheezing. 05/17/13  Yes Orma Flaming, MD  amLODipine (NORVASC) 10 MG tablet Take 1 tablet (10 mg total) by mouth daily. PATIENT NEEDS BP RE-CHECK FOR ADDITIONAL REFILLS 02/08/14  Yes Posey Boyer, MD  amLODipine (NORVASC) 10 MG tablet Take 1 tablet (10 mg total) by mouth daily. 03/09/14  Yes Eleanore Kurtis Bushman, PA-C  benazepril-hydrochlorthiazide (LOTENSIN HCT) 20-12.5 MG per tablet Take 1 tablet by mouth daily.   Yes Posey Boyer, MD  rosuvastatin (CRESTOR) 20 MG tablet Take 1 tablet (20 mg total) by mouth daily. 02/08/14  Yes Posey Boyer, MD  Vitamin D, Ergocalciferol, (DRISDOL) 50000 UNITS CAPS capsule TAKE ONE CAPSULE BY MOUTH EVERY 7 DAYS NEEDS OFFICE VISIT 12/02/14  Yes Chelle S Jeffery, PA-C  amLODipine-benazepril (LOTREL) 10-20 MG per capsule Take 1 capsule by mouth daily.    Historical Provider,  MD  benazepril (LOTENSIN) 40 MG tablet Take 1 tablet (40 mg total) by mouth daily. Patient not taking: Reported on 12/03/2014 05/17/13   Orma Flaming, MD  pravastatin (PRAVACHOL) 40 MG tablet Take 1 tablet (40 mg total) by mouth daily. Needs office visit Patient not taking: Reported on 12/03/2014 11/16/13   Roselee Culver, MD  sildenafil (VIAGRA) 100 MG tablet Take 0.5-1 tablets (50-100 mg total) by mouth daily as needed for erectile dysfunction. 04/09/12 05/09/12  Orma Flaming, MD  sildenafil (VIAGRA) 100 MG tablet Take 1 tablet (100 mg total) by mouth daily as needed for erectile dysfunction. Patient not taking: Reported on 12/03/2014 11/16/13   Roselee Culver, MD     ROS: The patient denies fevers, chills,  night sweats, unintentional weight loss, chest pain, palpitations, wheezing, dyspnea on exertion, nausea, vomiting, abdominal pain, dysuria, hematuria, melena, numbness, weakness, or tingling.   All other systems have been reviewed and were otherwise negative with the exception of those mentioned in the HPI and as above.    PHYSICAL EXAM: Filed Vitals:   12/03/14 0817  BP: 130/80  Pulse: 78  Temp: 98.2 F (36.8 C)  Resp: 16   Filed Vitals:   12/03/14 0817  Height: 5\' 8"  (1.727 m)  Weight: 161 lb (73.029 kg)   Body mass index is 24.49 kg/(m^2).  General: Alert, no acute distress HEENT:  Normocephalic, atraumatic, oropharynx patent. EOMI, PERRLA, fundo exam normal Cardiovascular:  Regular rate and rhythm, no rubs murmurs or gallops.  No Carotid bruits, radial pulse intact. No pedal edema.  Respiratory: Clear to auscultation bilaterally.  No wheezes, rales, or rhonchi.  No cyanosis, no use of accessory musculature GI: No organomegaly, abdomen is soft and non-tender, positive bowel sounds.  No masses. Skin: No rashes. Neurologic: Facial musculature symmetric. Psychiatric: Patient is appropriate throughout our interaction. Lymphatic: No cervical lymphadenopathy Musculoskeletal: Gait intact. Prostate normal, neg for hernia, GU exam normal   LABS: Results for orders placed or performed in visit on 02/08/14  Comprehensive metabolic panel  Result Value Ref Range   Sodium 141 135 - 145 mEq/L   Potassium 4.5 3.5 - 5.3 mEq/L   Chloride 104 96 - 112 mEq/L   CO2 30 19 - 32 mEq/L   Glucose, Bld 96 70 - 99 mg/dL   BUN 11 6 - 23 mg/dL   Creat 1.34 0.50 - 1.35 mg/dL   Total Bilirubin 1.3 (H) 0.2 - 1.2 mg/dL   Alkaline Phosphatase 77 39 - 117 U/L   AST 18 0 - 37 U/L   ALT 17 0 - 53 U/L   Total Protein 7.4 6.0 - 8.3 g/dL   Albumin 4.1 3.5 - 5.2 g/dL   Calcium 9.2 8.4 - 10.5 mg/dL  Lipid panel  Result Value Ref Range   Cholesterol 207 (H) 0 - 200 mg/dL   Triglycerides 77 <150 mg/dL     HDL 57 >39 mg/dL   Total CHOL/HDL Ratio 3.6 Ratio   VLDL 15 0 - 40 mg/dL   LDL Cholesterol 135 (H) 0 - 99 mg/dL     EKG/XRAY:   Primary read interpreted by Dr. Marin Comment at [facility].   ASSESSMENT/PLAN: Encounter Diagnoses  Name Primary?  . Annual physical exam Yes  . Hyperlipidemia   . Screening for thyroid disorder   . Essential hypertension   . Influenza vaccination declined   . Screening for prostate cancer   . History of colonic polyps   . Vitamin D deficiency  64 y/o male here for annual and also HTN, Hyperlipidemia and asthma medication refills He is not taking any high choesterol meds, but is taking everything else Decline Prevnar, is UTD on PPSV 23 Declines flu vaccine IS UTD on TDap Annual labs pending HTN meds refilled IS UTD on colonscopy, recommend that he return to [place] for Colonscopy since last one was in 2011 and he hhad polyps removed and he was to return in 5 years HE willlook into a different GI doctor to see if colonscopy cost is less since he has hx of polyps or if he will be charge diwth $1000 on top of what the insurance pays. He will let us know.  Fu in 1 year  Gross sideeffects, risk and benefits, and alternatives of medications d/w patient. Patient is aware that all medications have potential sideeffects and we are unable to predict every sideeffect or drug-drug interaction that may occur.  Frank Howell, [place], DO 12/03/2014 9:51 AM

## 2014-12-04 ENCOUNTER — Other Ambulatory Visit: Payer: Self-pay | Admitting: Family Medicine

## 2014-12-04 LAB — PSA: PSA: 3.17 ng/mL (ref <=4.00)

## 2014-12-09 ENCOUNTER — Encounter: Payer: Self-pay | Admitting: Internal Medicine

## 2015-01-20 ENCOUNTER — Ambulatory Visit (INDEPENDENT_AMBULATORY_CARE_PROVIDER_SITE_OTHER): Payer: Medicare Other | Admitting: Family Medicine

## 2015-01-20 ENCOUNTER — Ambulatory Visit (INDEPENDENT_AMBULATORY_CARE_PROVIDER_SITE_OTHER): Payer: Medicare Other

## 2015-01-20 VITALS — BP 138/94 | HR 74 | Temp 97.4°F | Resp 16 | Ht 68.0 in | Wt 158.0 lb

## 2015-01-20 DIAGNOSIS — M25552 Pain in left hip: Secondary | ICD-10-CM

## 2015-01-20 DIAGNOSIS — M25562 Pain in left knee: Secondary | ICD-10-CM | POA: Diagnosis not present

## 2015-01-20 DIAGNOSIS — R7989 Other specified abnormal findings of blood chemistry: Secondary | ICD-10-CM

## 2015-01-20 DIAGNOSIS — M79605 Pain in left leg: Secondary | ICD-10-CM | POA: Diagnosis not present

## 2015-01-20 DIAGNOSIS — R799 Abnormal finding of blood chemistry, unspecified: Secondary | ICD-10-CM | POA: Diagnosis not present

## 2015-01-20 LAB — BASIC METABOLIC PANEL
CO2: 29 mEq/L (ref 19–32)
Calcium: 9.2 mg/dL (ref 8.4–10.5)
Chloride: 102 mEq/L (ref 96–112)
Glucose, Bld: 95 mg/dL (ref 70–99)
Potassium: 4.8 mEq/L (ref 3.5–5.3)
Sodium: 139 mEq/L (ref 135–145)

## 2015-01-20 LAB — BASIC METABOLIC PANEL WITH GFR
BUN: 13 mg/dL (ref 6–23)
Creat: 1.33 mg/dL (ref 0.50–1.35)

## 2015-01-20 NOTE — Progress Notes (Signed)
Chief Complaint:  Chief Complaint  Patient presents with  . Pain    Left side radiating down into L Leg 3 to 4 months off and on    HPI: Frank Howell is a 64 y.o. male who is here for  Left hip, leg and knee pain for the last  3-4 months. He has psoriasis but it has never manifested into his joints.  He has had psoriaris  Since his 43s, he has intermittent sharp pain, 7/10 pain, worse with acitivities ie walking fast on the treatdmill or doing a lot of beding and lifting, has not tried anything for it, he feels it in his bone, has some crepitus with his knee.He has it in his left hip and then radiates down to leg and knee, rarely does it go up to his leg. Deneis n/w/t. He has high cholesterol but it is diet controlled and he has been offof his stain for sometime now > 6 months   Past Medical History  Diagnosis Date  . Abnormal liver function   . Dermatitis   . GERD (gastroesophageal reflux disease)   . Hypertension   . Psoriasis   . Allergy   . Asthma   . Hyperlipidemia    No past surgical history on file. History   Social History  . Marital Status: Married    Spouse Name: N/A  . Number of Children: N/A  . Years of Education: N/A   Social History Main Topics  . Smoking status: Never Smoker   . Smokeless tobacco: Never Used  . Alcohol Use: Yes     Comment: occasionally  . Drug Use: No  . Sexual Activity: Not on file   Other Topics Concern  . None   Social History Narrative   Family History  Problem Relation Age of Onset  . Asthma Mother   . Hypertension Sister   . Hypertension Brother    Allergies  Allergen Reactions  . Penicillins     REACTION: skin irritation   Prior to Admission medications   Medication Sig Start Date End Date Taking? Authorizing Provider  albuterol (PROVENTIL HFA;VENTOLIN HFA) 108 (90 BASE) MCG/ACT inhaler Inhale 2 puffs into the lungs every 6 (six) hours as needed for wheezing. 05/17/13  Yes Orma Flaming, MD  amLODipine  (NORVASC) 10 MG tablet Take 1 tablet (10 mg total) by mouth daily. 12/03/14  Yes Thao P Le, DO  benazepril-hydrochlorthiazide (LOTENSIN HCT) 20-12.5 MG per tablet Take 1 tablet by mouth daily. 12/03/14  Yes Thao P Le, DO  Fluticasone-Salmeterol (ADVAIR DISKUS) 250-50 MCG/DOSE AEPB Inhale 2 puffs into the lungs 2 (two) times daily. 12/03/14  Yes Thao P Le, DO  Vitamin D, Ergocalciferol, (DRISDOL) 50000 UNITS CAPS capsule TAKE ONE CAPSULE BY MOUTH EVERY 7 DAYS 12/03/14  Yes Thao P Le, DO  rosuvastatin (CRESTOR) 20 MG tablet Take 1 tablet (20 mg total) by mouth daily. Patient not taking: Reported on 01/20/2015 02/08/14   Posey Boyer, MD  sildenafil (VIAGRA) 100 MG tablet Take 0.5-1 tablets (50-100 mg total) by mouth daily as needed for erectile dysfunction. 04/09/12 05/09/12  Orma Flaming, MD     ROS: The patient denies fevers, chills, night sweats, unintentional weight loss, chest pain, palpitations, wheezing, dyspnea on exertion, nausea, vomiting, abdominal pain, dysuria, hematuria, melena, numbness, weakness, or tingling.   All other systems have been reviewed and were otherwise negative with the exception of those mentioned in the HPI and as above.  PHYSICAL EXAM: Filed Vitals:   01/20/15 0834  BP: 138/94  Pulse: 74  Temp: 97.4 F (36.3 C)  Resp: 16   Filed Vitals:   01/20/15 0834  Height: 5\' 8"  (1.727 m)  Weight: 158 lb (71.668 kg)   Body mass index is 24.03 kg/(m^2).  General: Alert, no acute distress HEENT:  Normocephalic, atraumatic, oropharynx patent. EOMI, PERRLA Cardiovascular:  Regular rate and rhythm, no rubs murmurs or gallops.  No Carotid bruits, radial pulse intact. No pedal edema.  Respiratory: Clear to auscultation bilaterally.  No wheezes, rales, or rhonchi.  No cyanosis, no use of accessory musculature GI: No organomegaly, abdomen is soft and non-tender, positive bowel sounds.  No masses. Skin: No rashes. Neurologic: Facial musculature symmetric. Psychiatric:  Patient is appropriate throughout our interaction. Lymphatic: No cervical lymphadenopathy Musculoskeletal: Gait intact. + paramsk tenderness hip, no localized to great troch Full ROM 5/5 strength, 2/2 DTRs of knee No saddle anesthesia Straight leg negative Neg obers, neg crepitus, nontender IT band He has full ROM and normal knee exam     LABS: Results for orders placed or performed in visit on 12/03/14  COMPLETE METABOLIC PANEL WITH GFR  Result Value Ref Range   Sodium 140 135 - 145 mEq/L   Potassium 3.8 3.5 - 5.3 mEq/L   Chloride 102 96 - 112 mEq/L   CO2 32 19 - 32 mEq/L   Glucose, Bld 91 70 - 99 mg/dL   BUN 12 6 - 23 mg/dL   Creat 1.46 (H) 0.50 - 1.35 mg/dL   Total Bilirubin 1.1 0.2 - 1.2 mg/dL   Alkaline Phosphatase 90 39 - 117 U/L   AST 16 0 - 37 U/L   ALT 13 0 - 53 U/L   Total Protein 7.2 6.0 - 8.3 g/dL   Albumin 3.9 3.5 - 5.2 g/dL   Calcium 9.3 8.4 - 10.5 mg/dL   GFR, Est African American 58 (L) mL/min   GFR, Est Non African American 50 (L) mL/min  CBC  Result Value Ref Range   WBC 4.5 4.0 - 10.5 K/uL   RBC 5.54 4.22 - 5.81 MIL/uL   Hemoglobin 16.2 13.0 - 17.0 g/dL   HCT 48.3 39.0 - 52.0 %   MCV 87.2 78.0 - 100.0 fL   MCH 29.2 26.0 - 34.0 pg   MCHC 33.5 30.0 - 36.0 g/dL   RDW 14.1 11.5 - 15.5 %   Platelets 158 150 - 400 K/uL   MPV 11.6 8.6 - 12.4 fL  Lipid panel  Result Value Ref Range   Cholesterol 175 0 - 200 mg/dL   Triglycerides 117 <150 mg/dL   HDL 50 >39 mg/dL   Total CHOL/HDL Ratio 3.5 Ratio   VLDL 23 0 - 40 mg/dL   LDL Cholesterol 102 (H) 0 - 99 mg/dL  TSH  Result Value Ref Range   TSH 2.227 0.350 - 4.500 uIU/mL  PSA  Result Value Ref Range   PSA 3.17 <=4.00 ng/mL  Vitamin D, 25-hydroxy  Result Value Ref Range   Vit D, 25-Hydroxy 52 30 - 100 ng/mL  POCT UA - Microscopic Only  Result Value Ref Range   WBC, Ur, HPF, POC 0-1    RBC, urine, microscopic neg    Bacteria, U Microscopic trace    Mucus, UA neg    Epithelial cells, urine per  micros 0-1    Crystals, Ur, HPF, POC neg    Casts, Ur, LPF, POC neg    Yeast, UA neg  EKG/XRAY:   Primary read interpreted by Dr. Marin Comment at [facility]. + hip Arthritis, neg for fracture or dislocation    ASSESSMENT/PLAN: Encounter Diagnoses  Name Primary?  . Left leg pain Yes  . Left knee pain   . Left hip pain   . Abnormal serum creatinine level    Tylenol prn BMP pending Modify acitivities Fu prn   Gross sideeffects, risk and benefits, and alternatives of medications d/w patient. Patient is aware that all medications have potential sideeffects and we are unable to predict every sideeffect or drug-drug interaction that may occur.  LE, [place], DO 01/20/2015 9:57 AM

## 2015-01-22 ENCOUNTER — Encounter: Payer: Self-pay | Admitting: Family Medicine

## 2015-02-03 ENCOUNTER — Other Ambulatory Visit: Payer: Self-pay | Admitting: Physician Assistant

## 2015-05-06 ENCOUNTER — Ambulatory Visit (INDEPENDENT_AMBULATORY_CARE_PROVIDER_SITE_OTHER): Payer: Medicare Other | Admitting: Family Medicine

## 2015-05-06 VITALS — BP 140/90 | HR 70 | Temp 98.3°F | Resp 17 | Ht 68.0 in | Wt 159.4 lb

## 2015-05-06 DIAGNOSIS — H109 Unspecified conjunctivitis: Secondary | ICD-10-CM

## 2015-05-06 DIAGNOSIS — B360 Pityriasis versicolor: Secondary | ICD-10-CM | POA: Diagnosis not present

## 2015-05-06 MED ORDER — FLUCONAZOLE 200 MG PO TABS: 200.0000 mg | ORAL_TABLET | Freq: Every day | ORAL | Status: DC

## 2015-05-06 MED ORDER — TOBRAMYCIN 0.3 % OP SOLN: 1.0000 [drp] | Freq: Four times a day (QID) | OPHTHALMIC | Status: DC

## 2015-05-06 NOTE — Progress Notes (Signed)
This is a 64 year old gentleman originally from [place]. 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  He comes in complaining about an irritated left eye. He has a small excrescence on the lower lid along with mucoid discharge and sticky lids. The eye is reddened and irritated. He had the same problem last year. Nares had this for about 5 days.. He's been using hot compresses without success.  Patient also complains about a persistent skin rash on his back, abdomen, lower extremities. He was told he had psoriasis. He's asking for a blood test. Patient is her been given a cream her medication for this and is been going on for over 3 years.  Objective:BP 140/90 mmHg  Pulse 70  Temp(Src) 98.3 F (36.8 C) (Oral)  Resp 17  Ht 5\' 8"  (1.727 m)  Wt 159 lb 6.4 oz (72.303 kg)  BMI 24.24 kg/m2  SpO2 97% Patient is alert and cooperative, very educated articulate. Eyes: Patient has a small excrescence about 1 mm in the mid lower lid on the left. He has diffuse injection and some purulent debris along the lid line. His extra ocular motion is normal.  Patient has an unusual depigmenting rash which is scaly and plaque-like on his back, anterior shins, and abdomen. None of these are draining.  Assessment: This chart was scribed in my presence and reviewed by me personally.    ICD-9-CM ICD-10-CM   1. Tinea versicolor 111.0 B36.0 fluconazole (DIFLUCAN) 200 MG tablet  2. Conjunctivitis of left eye 372.30 H10.9 tobramycin (TOBREX) 0.3 % ophthalmic solution     Signed, Robyn Haber, MD

## 2015-05-06 NOTE — Patient Instructions (Signed)
Bacterial Conjunctivitis Bacterial conjunctivitis, commonly called pink eye, is an inflammation of the clear membrane that covers the white part of the eye (conjunctiva). The inflammation can also happen on the underside of the eyelids. The blood vessels in the conjunctiva become inflamed, causing the eye to become red or pink. Bacterial conjunctivitis may spread easily from one eye to another and from person to person (contagious).  CAUSES  Bacterial conjunctivitis is caused by bacteria. The bacteria may come from your own skin, your upper respiratory tract, or from someone else with bacterial conjunctivitis. SYMPTOMS  The normally white color of the eye or the underside of the eyelid is usually pink or red. The pink eye is usually associated with irritation, tearing, and some sensitivity to light. Bacterial conjunctivitis is often associated with a thick, yellowish discharge from the eye. The discharge may turn into a crust on the eyelids overnight, which causes your eyelids to stick together. If a discharge is present, there may also be some blurred vision in the affected eye. DIAGNOSIS  Bacterial conjunctivitis is diagnosed by your caregiver through an eye exam and the symptoms that you report. Your caregiver looks for changes in the surface tissues of your eyes, which may point to the specific type of conjunctivitis. A sample of any discharge may be collected on a cotton-tip swab if you have a severe case of conjunctivitis, if your cornea is affected, or if you keep getting repeat infections that do not respond to treatment. The sample will be sent to a lab to see if the inflammation is caused by a bacterial infection and to see if the infection will respond to antibiotic medicines. TREATMENT   Bacterial conjunctivitis is treated with antibiotics. Antibiotic eyedrops are most often used. However, antibiotic ointments are also available. Antibiotics pills are sometimes used. Artificial tears or eye  washes may ease discomfort. HOME CARE INSTRUCTIONS   To ease discomfort, apply a cool, clean washcloth to your eye for 10-20 minutes, 3-4 times a day.  Gently wipe away any drainage from your eye with a warm, wet washcloth or a cotton ball.  Wash your hands often with soap and water. Use paper towels to dry your hands.  Do not share towels or washcloths. This may spread the infection.  Change or wash your pillowcase every day.  You should not use eye makeup until the infection is gone.  Do not operate machinery or drive if your vision is blurred.  Stop using contact lenses. Ask your caregiver how to sterilize or replace your contacts before using them again. This depends on the type of contact lenses that you use.  When applying medicine to the infected eye, do not touch the edge of your eyelid with the eyedrop bottle or ointment tube. SEEK IMMEDIATE MEDICAL CARE IF:   Your infection has not improved within 3 days after beginning treatment.  You had yellow discharge from your eye and it returns.  You have increased eye pain.  Your eye redness is spreading.  Your vision becomes blurred.  You have a fever or persistent symptoms for more than 2-3 days.  You have a fever and your symptoms suddenly get worse.  You have facial pain, redness, or swelling. MAKE SURE YOU:   Understand these instructions.  Will watch your condition.  Will get help right away if you are not doing well or get worse. Document Released: 11/08/2005 Document Revised: 03/25/2014 Document Reviewed: 04/10/2012 [employer] Patient Information 2015 [employer]. This information is not intended to   replace advice given to you by your health care provider. Make sure you discuss any questions you have with your health care provider. Tinea Versicolor Tinea versicolor is a common yeast infection of the skin. This condition becomes known when the yeast on our skin starts to overgrow (yeast is a normal inhabitant  on our skin). This condition is noticed as white or light brown patches on brown skin, and is more evident in the summer on tanned skin. These areas are slightly scaly if scratched. The light patches from the yeast become evident when the yeast creates "holes in your suntan". This is most often noticed in the summer. The patches are usually located on the chest, back, pubis, neck and body folds. However, it may occur on any area of body. Mild itching and inflammation (redness or soreness) may be present. DIAGNOSIS  The diagnosisof this is made clinically (by looking). Cultures from samples are usually not needed. Examination under the microscope may help. However, yeast is normally found on skin. The diagnosis still remains clinical. Examination under Wood's Ultraviolet Light can determine the extent of the infection. TREATMENT  This common infection is usually only of cosmetic (only a concern to your appearance). It is easily treated with dandruff shampoo used during showers or bathing. Vigorous scrubbing will eliminate the yeast over several days time. The light areas in your skin may remain for weeks or months after the infection is cured unless your skin is exposed to sunlight. The lighter or darker spots caused by the fungus that remain after complete treatment are not a sign of treatment failure; it will take a long time to resolve. Your caregiver may recommend a number of commercial preparations or medication by mouth if home care is not working. Recurrence is common and preventative medication may be necessary. This skin condition is not highly contagious. Special care is not needed to protect close friends and family members. Normal hygiene is usually enough. Follow up is required only if you develop complications (such as a secondary infection from scratching), if recommended by your caregiver, or if no relief is obtained from the preparations used. Document Released: 11/05/2000 Document Revised:  01/31/2012 Document Reviewed: 12/18/2008 [facility] Patient Information 2015 [place], [place]. This information is not intended to replace advice given to you by your health care provider. Make sure you discuss any questions you have with your health care provider.

## 2015-05-19 ENCOUNTER — Other Ambulatory Visit: Payer: Self-pay

## 2015-07-28 ENCOUNTER — Ambulatory Visit (INDEPENDENT_AMBULATORY_CARE_PROVIDER_SITE_OTHER): Payer: Medicare Other | Admitting: Family Medicine

## 2015-07-28 VITALS — BP 132/80 | HR 64 | Temp 98.2°F | Resp 16 | Ht 67.5 in | Wt 164.4 lb

## 2015-07-28 DIAGNOSIS — I10 Essential (primary) hypertension: Secondary | ICD-10-CM | POA: Diagnosis not present

## 2015-07-28 DIAGNOSIS — F524 Premature ejaculation: Secondary | ICD-10-CM

## 2015-07-28 MED ORDER — LOSARTAN POTASSIUM-HCTZ 100-12.5 MG PO TABS: 1.0000 | ORAL_TABLET | Freq: Every day | ORAL | Status: DC

## 2015-07-28 NOTE — Progress Notes (Signed)
Patient ID: Frank Howell, male   DOB: 1951/01/05, 64 y.o.   MRN: [id number]  This chart was scribed for Frank Haber, MD by Ladene Artist, ED Scribe. The patient was seen in room 10. Patient's care was started at 10:20 AM.  Patient ID: Frank Howell MRN: [id number], DOB: 13-Mar-1951, 64 y.o. Date of Encounter: 07/28/2015, 10:19 AM  Primary Physician: Kennon Portela, MD  Chief Complaint  Patient presents with  . Hypertension    blood pressure issues---declined flu shot today    HPI: 64 y.o. year old male with history below presents for a follow-up regarding HTN. Pt states that he would like to change his BP medication from Lisinopril to Losartan; suspects that his current medication causes lethargy.   Urology   Pt reports premature ejaculation for the past year that he would like to have evaluated by an urologist. He further reports intermittent warmth in his genitalia for a few months, but denies any warmth at this time. He also denies dysuria. No other alleviating or aggravating factors. No treatments tried PTA.   Immunizations Pt was offered a flu vaccine today but he declined.   Pt plans to travel home to [place] in 2 weeks. He has retired and currently works from home. Pt exercises at the gym x3-4 days/week.   Past Medical History  Diagnosis Date  . Abnormal liver function   . Dermatitis   . GERD (gastroesophageal reflux disease)   . Hypertension   . Psoriasis   . Allergy   . Asthma   . Hyperlipidemia      Home Meds: Prior to Admission medications   Medication Sig Start Date End Date Taking? Authorizing Provider  amLODipine (NORVASC) 10 MG tablet Take 1 tablet (10 mg total) by mouth daily. 12/03/14  Yes Thao P Le, DO  benazepril-hydrochlorthiazide (LOTENSIN HCT) 20-12.5 MG per tablet Take 1 tablet by mouth daily. 12/03/14  Yes Thao P Le, DO  Fluticasone-Salmeterol (ADVAIR DISKUS) 250-50 MCG/DOSE AEPB Inhale 2 puffs into the lungs 2 (two) times daily. 12/03/14  Yes Thao P  Le, DO  Vitamin D, Ergocalciferol, (DRISDOL) 50000 UNITS CAPS capsule TAKE ONE CAPSULE BY MOUTH EVERY 7 DAYS 12/03/14  Yes Thao P Le, DO  albuterol (PROVENTIL HFA;VENTOLIN HFA) 108 (90 BASE) MCG/ACT inhaler Inhale 2 puffs into the lungs every 6 (six) hours as needed for wheezing. Patient not taking: Reported on 05/06/2015 05/17/13   Orma Flaming, MD  fluconazole (DIFLUCAN) 200 MG tablet Take 1 tablet (200 mg total) by mouth daily. Patient not taking: Reported on 07/28/2015 05/06/15   Frank Haber, MD  rosuvastatin (CRESTOR) 20 MG tablet Take 1 tablet (20 mg total) by mouth daily. Patient not taking: Reported on 01/20/2015 02/08/14   Posey Boyer, MD  sildenafil (VIAGRA) 100 MG tablet Take 0.5-1 tablets (50-100 mg total) by mouth daily as needed for erectile dysfunction. 04/09/12 05/09/12  Orma Flaming, MD  tobramycin (TOBREX) 0.3 % ophthalmic solution Place 1 drop into the left eye every 6 (six) hours. Patient not taking: Reported on 07/28/2015 05/06/15   Frank Haber, MD    Allergies:  Allergies  Allergen Reactions  . Penicillins     REACTION: skin irritation    Social History   Social History  . Marital Status: Married    Spouse Name: N/A  . Number of Children: N/A  . Years of Education: N/A   Occupational History  . Not on file.   Social History Main Topics  . Smoking status: Never  Smoker   . Smokeless tobacco: Never Used  . Alcohol Use: 0.0 oz/week    0 Standard drinks or equivalent per week     Comment: occasionally  . Drug Use: No  . Sexual Activity: Not on file   Other Topics Concern  . Not on file   Social History Narrative     Review of Systems: Constitutional: negative for chills, fever, night sweats, weight changes, + fatigue  HEENT: negative for vision changes, hearing loss, congestion, rhinorrhea, ST, epistaxis, or sinus pressure Cardiovascular: negative for chest pain or palpitations Respiratory: negative for hemoptysis, wheezing, shortness of breath, or  cough Abdominal: negative for abdominal pain, nausea, vomiting, diarrhea, or constipation GU: negative for dysuria Dermatological: negative for rash Neurologic: negative for headache, dizziness, or syncope All other systems reviewed and are otherwise negative with the exception to those above and in the HPI.  Physical Exam: Blood pressure 132/80, pulse 64, temperature 98.2 F (36.8 C), temperature source Oral, resp. rate 16, height 5' 7.5" (1.715 m), weight 164 lb 6 oz (74.56 kg), SpO2 98 %., Body mass index is 25.35 kg/(m^2). General: Well developed, well nourished, in no acute distress. Head: Normocephalic, atraumatic, eyes without discharge, sclera non-icteric, nares are without discharge. Bilateral auditory canals clear, TM's are without perforation, pearly grey and translucent with reflective cone of light bilaterally. Oral cavity moist, posterior pharynx without exudate, erythema, peritonsillar abscess, or post nasal drip.  Neck: Supple. No thyromegaly. Full ROM. No lymphadenopathy. Lungs: Clear bilaterally to auscultation without wheezes, rales, or rhonchi. Breathing is unlabored. Heart: RRR with S1 S2. No murmurs, rubs, or gallops appreciated. Abdomen: Soft, non-tender, non-distended with normoactive bowel sounds. No hepatomegaly. No rebound/guarding. No obvious abdominal masses. Msk:  Strength and tone normal for age. GU: Normal genitalia.  Extremities/Skin: Warm and dry. No clubbing or cyanosis. No edema. No rashes or suspicious lesions. Neuro: Alert and oriented X 3. Moves all extremities spontaneously. Gait is normal. CNII-XII grossly in tact. Psych:  Responds to questions appropriately with a normal affect.    ASSESSMENT AND PLAN:  64 y.o. year old male with  1. Premature ejaculation   2. Essential hypertension      This chart was scribed in my presence and reviewed by me personally.    ICD-9-CM ICD-10-CM   1. Premature ejaculation 302.75 F52.4 Ambulatory referral to  Urology  2. Essential hypertension 401.9 I10 losartan-hydrochlorothiazide (HYZAAR) 100-12.5 MG per tablet     Signed, Frank Haber, MD 07/28/2015 10:19 AM

## 2015-07-29 ENCOUNTER — Encounter: Payer: Self-pay | Admitting: Family Medicine

## 2015-07-30 ENCOUNTER — Other Ambulatory Visit: Payer: Self-pay | Admitting: Family Medicine

## 2015-07-30 ENCOUNTER — Telehealth: Payer: Self-pay

## 2015-07-30 MED ORDER — LOSARTAN POTASSIUM 50 MG PO TABS: 50.0000 mg | ORAL_TABLET | Freq: Every day | ORAL | Status: DC

## 2015-07-30 MED ORDER — VITAMIN D (ERGOCALCIFEROL) 1.25 MG (50000 UNIT) PO CAPS: ORAL_CAPSULE | ORAL | Status: DC

## 2015-07-30 NOTE — Telephone Encounter (Signed)
Patient is calling because he sent Dr. Joseph Art an e-mail. He would like to get a refill for the blood pressure medication but he would like it without HCTZ and to also lower the dosage to 50MG . Patient states he is completely out.  [facility] [address].

## 2015-07-31 NOTE — Telephone Encounter (Signed)
Sent a my chart message.

## 2015-08-08 ENCOUNTER — Ambulatory Visit (INDEPENDENT_AMBULATORY_CARE_PROVIDER_SITE_OTHER): Payer: Medicare Other | Admitting: Family Medicine

## 2015-08-08 ENCOUNTER — Emergency Department (HOSPITAL_COMMUNITY): Payer: Medicare Other

## 2015-08-08 ENCOUNTER — Encounter (HOSPITAL_COMMUNITY): Payer: Self-pay

## 2015-08-08 ENCOUNTER — Inpatient Hospital Stay (HOSPITAL_COMMUNITY)
Admission: EM | Admit: 2015-08-08 | Discharge: 2015-08-09 | DRG: 313 | Disposition: A | Discharge status: Home / Self Care | Payer: Medicare Other | Attending: Family Medicine | Admitting: Family Medicine

## 2015-08-08 ENCOUNTER — Encounter: Payer: Self-pay | Admitting: Family Medicine

## 2015-08-08 VITALS — BP 160/86 | HR 86 | Temp 98.0°F | Resp 16 | Ht 67.25 in | Wt 163.6 lb

## 2015-08-08 DIAGNOSIS — Z8249 Family history of ischemic heart disease and other diseases of the circulatory system: Secondary | ICD-10-CM

## 2015-08-08 DIAGNOSIS — D696 Thrombocytopenia, unspecified: Secondary | ICD-10-CM | POA: Diagnosis present

## 2015-08-08 DIAGNOSIS — E785 Hyperlipidemia, unspecified: Secondary | ICD-10-CM | POA: Diagnosis present

## 2015-08-08 DIAGNOSIS — Z880 Allergy status to penicillin: Secondary | ICD-10-CM

## 2015-08-08 DIAGNOSIS — K219 Gastro-esophageal reflux disease without esophagitis: Secondary | ICD-10-CM | POA: Diagnosis present

## 2015-08-08 DIAGNOSIS — L409 Psoriasis, unspecified: Secondary | ICD-10-CM | POA: Diagnosis present

## 2015-08-08 DIAGNOSIS — I10 Essential (primary) hypertension: Secondary | ICD-10-CM

## 2015-08-08 DIAGNOSIS — R9431 Abnormal electrocardiogram [ECG] [EKG]: Secondary | ICD-10-CM

## 2015-08-08 DIAGNOSIS — R0789 Other chest pain: Principal | ICD-10-CM | POA: Diagnosis present

## 2015-08-08 DIAGNOSIS — N182 Chronic kidney disease, stage 2 (mild): Secondary | ICD-10-CM | POA: Diagnosis present

## 2015-08-08 DIAGNOSIS — Z79899 Other long term (current) drug therapy: Secondary | ICD-10-CM | POA: Diagnosis not present

## 2015-08-08 DIAGNOSIS — J45909 Unspecified asthma, uncomplicated: Secondary | ICD-10-CM | POA: Diagnosis present

## 2015-08-08 DIAGNOSIS — Z7982 Long term (current) use of aspirin: Secondary | ICD-10-CM | POA: Diagnosis not present

## 2015-08-08 DIAGNOSIS — R195 Other fecal abnormalities: Secondary | ICD-10-CM | POA: Diagnosis not present

## 2015-08-08 DIAGNOSIS — R079 Chest pain, unspecified: Secondary | ICD-10-CM | POA: Diagnosis present

## 2015-08-08 DIAGNOSIS — Z825 Family history of asthma and other chronic lower respiratory diseases: Secondary | ICD-10-CM | POA: Diagnosis not present

## 2015-08-08 DIAGNOSIS — I129 Hypertensive chronic kidney disease with stage 1 through stage 4 chronic kidney disease, or unspecified chronic kidney disease: Secondary | ICD-10-CM | POA: Diagnosis present

## 2015-08-08 DIAGNOSIS — R071 Chest pain on breathing: Secondary | ICD-10-CM | POA: Diagnosis not present

## 2015-08-08 DIAGNOSIS — Z23 Encounter for immunization: Secondary | ICD-10-CM

## 2015-08-08 LAB — CBC WITH DIFFERENTIAL/PLATELET
BASOS ABS: 0 10*3/uL (ref 0.0–0.1)
BASOS PCT: 1 %
EOS PCT: 3 %
Eosinophils Absolute: 0.1 10*3/uL (ref 0.0–0.7)
HEMATOCRIT: 46.3 % (ref 39.0–52.0)
Hemoglobin: 15.9 g/dL (ref 13.0–17.0)
Lymphocytes Relative: 29 %
Lymphs Abs: 1.2 10*3/uL (ref 0.7–4.0)
MCH: 29.6 pg (ref 26.0–34.0)
MCHC: 34.3 g/dL (ref 30.0–36.0)
MCV: 86.2 fL (ref 78.0–100.0)
MONO ABS: 0.3 10*3/uL (ref 0.1–1.0)
MONOS PCT: 8 %
NEUTROS ABS: 2.4 10*3/uL (ref 1.7–7.7)
Neutrophils Relative %: 59 %
PLATELETS: 149 10*3/uL — AB (ref 150–400)
RBC: 5.37 MIL/uL (ref 4.22–5.81)
RDW: 13.8 % (ref 11.5–15.5)
WBC: 4.1 10*3/uL (ref 4.0–10.5)

## 2015-08-08 LAB — COMPREHENSIVE METABOLIC PANEL
ALBUMIN: 3.6 g/dL (ref 3.5–5.0)
ALT: 14 U/L — ABNORMAL LOW (ref 17–63)
ANION GAP: 6 (ref 5–15)
AST: 19 U/L (ref 15–41)
Alkaline Phosphatase: 72 U/L (ref 38–126)
BILIRUBIN TOTAL: 1.7 mg/dL — AB (ref 0.3–1.2)
BUN: 11 mg/dL (ref 6–20)
CHLORIDE: 105 mmol/L (ref 101–111)
CO2: 30 mmol/L (ref 22–32)
Calcium: 9.1 mg/dL (ref 8.9–10.3)
Creatinine, Ser: 1.58 mg/dL — ABNORMAL HIGH (ref 0.61–1.24)
GFR calc Af Amer: 51 mL/min — ABNORMAL LOW (ref >60)
GFR, EST NON AFRICAN AMERICAN: 44 mL/min — AB (ref >60)
GLUCOSE: 104 mg/dL — AB (ref 65–99)
POTASSIUM: 4.6 mmol/L (ref 3.5–5.1)
Sodium: 141 mmol/L (ref 135–145)
TOTAL PROTEIN: 6.9 g/dL (ref 6.5–8.1)

## 2015-08-08 LAB — TROPONIN I

## 2015-08-08 LAB — I-STAT TROPONIN, ED: TROPONIN I, POC: 0.01 ng/mL (ref 0.00–0.08)

## 2015-08-08 MED ORDER — LOSARTAN POTASSIUM 50 MG PO TABS
50.0000 mg | ORAL_TABLET | Freq: Every day | ORAL | Status: DC
  Administered 2015-08-09: 50 mg via ORAL
  Filled 2015-08-08: qty 1

## 2015-08-08 MED ORDER — NITROGLYCERIN 0.4 MG SL SUBL: 0.4000 mg | SUBLINGUAL_TABLET | SUBLINGUAL | Status: DC | PRN

## 2015-08-08 MED ORDER — CLONIDINE HCL 0.2 MG PO TABS
0.2000 mg | ORAL_TABLET | Freq: Two times a day (BID) | ORAL | Status: DC
  Administered 2015-08-08 – 2015-08-09 (×2): 0.2 mg via ORAL
  Filled 2015-08-08 (×2): qty 1

## 2015-08-08 MED ORDER — NITROGLYCERIN 0.3 MG SL SUBL
0.4000 mg | SUBLINGUAL_TABLET | SUBLINGUAL | Status: DC | PRN
  Administered 2015-08-08: 0.3 mg via SUBLINGUAL

## 2015-08-08 MED ORDER — ONDANSETRON HCL 4 MG/2ML IJ SOLN: 4.0000 mg | Freq: Four times a day (QID) | INTRAMUSCULAR | Status: DC | PRN

## 2015-08-08 MED ORDER — ACETAMINOPHEN 325 MG PO TABS: 650.0000 mg | ORAL_TABLET | ORAL | Status: DC | PRN

## 2015-08-08 MED ORDER — ENOXAPARIN SODIUM 40 MG/0.4ML ~~LOC~~ SOLN
40.0000 mg | SUBCUTANEOUS | Status: DC
  Filled 2015-08-08: qty 0.4

## 2015-08-08 MED ORDER — INFLUENZA VAC SPLIT QUAD 0.5 ML IM SUSY
0.5000 mL | PREFILLED_SYRINGE | INTRAMUSCULAR | Status: AC
  Administered 2015-08-09: 0.5 mL via INTRAMUSCULAR
  Filled 2015-08-08: qty 0.5

## 2015-08-08 MED ORDER — ASPIRIN 81 MG PO CHEW
324.0000 mg | CHEWABLE_TABLET | Freq: Once | ORAL | Status: AC
  Administered 2015-08-08: 324 mg via ORAL

## 2015-08-08 MED ORDER — METOPROLOL TARTRATE 25 MG PO TABS
25.0000 mg | ORAL_TABLET | Freq: Two times a day (BID) | ORAL | Status: DC
  Administered 2015-08-08 – 2015-08-09 (×2): 25 mg via ORAL
  Filled 2015-08-08 (×3): qty 1

## 2015-08-08 MED ORDER — VITAMIN D (ERGOCALCIFEROL) 1.25 MG (50000 UNIT) PO CAPS: 50000.0000 [IU] | ORAL_CAPSULE | ORAL | Status: DC

## 2015-08-08 MED ORDER — HYDROMORPHONE HCL 1 MG/ML IJ SOLN: 1.0000 mg | INTRAMUSCULAR | Status: AC | PRN

## 2015-08-08 MED ORDER — ASPIRIN EC 325 MG PO TBEC
325.0000 mg | DELAYED_RELEASE_TABLET | Freq: Every day | ORAL | Status: DC
  Administered 2015-08-09: 325 mg via ORAL
  Filled 2015-08-08 (×2): qty 1

## 2015-08-08 MED ORDER — ALBUTEROL SULFATE (2.5 MG/3ML) 0.083% IN NEBU: 2.5000 mg | INHALATION_SOLUTION | RESPIRATORY_TRACT | Status: AC | PRN

## 2015-08-08 MED ORDER — ASPIRIN EC 81 MG PO TBEC: 81.0000 mg | DELAYED_RELEASE_TABLET | ORAL | Status: DC | PRN

## 2015-08-08 MED ORDER — ONDANSETRON HCL 4 MG/2ML IJ SOLN: 4.0000 mg | Freq: Three times a day (TID) | INTRAMUSCULAR | Status: DC | PRN

## 2015-08-08 MED ORDER — LOSARTAN POTASSIUM-HCTZ 100-12.5 MG PO TABS: 1.0000 | ORAL_TABLET | Freq: Every day | ORAL | Status: DC

## 2015-08-08 MED ORDER — MOMETASONE FURO-FORMOTEROL FUM 100-5 MCG/ACT IN AERO
2.0000 | INHALATION_SPRAY | Freq: Two times a day (BID) | RESPIRATORY_TRACT | Status: DC
  Administered 2015-08-08: 2 via RESPIRATORY_TRACT
  Filled 2015-08-08: qty 8.8

## 2015-08-08 NOTE — ED Provider Notes (Signed)
Patient seen/examined in the Emergency Department in conjunction with Midlevel Provider  Patient reports chest pain Exam : awake/alert, no distress Plan: discussed admission due to cardiac risk factors with concerning story for ACS    Ripley Fraise, MD 08/08/15 1708

## 2015-08-08 NOTE — Consult Note (Signed)
Referring Physician: Nita Sells, MD  Frank Howell is an 64 y.o. male.                       Chief Complaint: Chest pain  HPI: After 3 days of heavy exercise at a gym patient has chest heaviness lasting for 2 days. [facility] and treated with oxygen, SL NTG and aspirin and had significant relief of chest pain. Currently chest pain free. He denies shortness of breath or sweating spell. EKG showed Sinus rhythm, LAHB and nonspecific T wave abnormality. Troponin-I normal x 1.  Past Medical History  Diagnosis Date  . Abnormal liver function   . Dermatitis   . GERD (gastroesophageal reflux disease)   . Hypertension   . Psoriasis   . Allergy   . Asthma   . Hyperlipidemia       History reviewed. No pertinent past surgical history.  Family History  Problem Relation Age of Onset  . Asthma Mother   . Hypertension Sister   . Hypertension Brother    Social History:  reports that he has never smoked. He has never used smokeless tobacco. He reports that he drinks alcohol. He reports that he does not use illicit drugs.  Allergies:  Allergies  Allergen Reactions  . Penicillins     REACTION: skin irritation    Facility-administered medications prior to admission  Medication Dose Route Frequency Provider Last Rate Last Dose  . nitroGLYCERIN (NITROSTAT) SL tablet 0.3 mg  0.3 mg Sublingual Q5 min PRN Mancel Bale, PA-C   0.3 mg at 08/08/15 1425   Medications Prior to Admission  Medication Sig Dispense Refill  . aspirin EC 81 MG tablet Take 81 mg by mouth every 4 (four) hours as needed for mild pain or moderate pain.    Marland Kitchen Fluticasone-Salmeterol (ADVAIR DISKUS) 250-50 MCG/DOSE AEPB Inhale 2 puffs into the lungs 2 (two) times daily. (Patient taking differently: Inhale 2 puffs into the lungs 2 (two) times daily as needed (shortness of breath). ) 1 each 11  . losartan (COZAAR) 50 MG tablet Take 1 tablet by mouth daily.  3  . losartan-hydrochlorothiazide  (HYZAAR) 100-12.5 MG per tablet Take 1 tablet by mouth daily. 90 tablet 3  . Vitamin D, Ergocalciferol, (DRISDOL) 50000 UNITS CAPS capsule TAKE ONE CAPSULE BY MOUTH EVERY 7 DAYS 12 capsule 0  . albuterol (PROVENTIL HFA;VENTOLIN HFA) 108 (90 BASE) MCG/ACT inhaler Inhale 2 puffs into the lungs every 6 (six) hours as needed for wheezing. (Patient not taking: Reported on 05/06/2015) 1 Inhaler 6  . amLODipine (NORVASC) 10 MG tablet Take 1 tablet (10 mg total) by mouth daily. (Patient not taking: Reported on 08/08/2015) 90 tablet 3  . rosuvastatin (CRESTOR) 20 MG tablet Take 1 tablet (20 mg total) by mouth daily. (Patient not taking: Reported on 01/20/2015) 90 tablet 3  . sildenafil (VIAGRA) 100 MG tablet Take 0.5-1 tablets (50-100 mg total) by mouth daily as needed for erectile dysfunction. 12 tablet 3    Results for orders placed or performed during the hospital encounter of 08/08/15 (from the past 48 hour(s))  CBC with Differential     Status: Abnormal   Collection Time: 08/08/15  4:22 PM  Result Value Ref Range   WBC 4.1 4.0 - 10.5 K/uL   RBC 5.37 4.22 - 5.81 MIL/uL   Hemoglobin 15.9 13.0 - 17.0 g/dL   HCT 46.3 39.0 - 52.0 %   MCV 86.2 78.0 - 100.0  fL   MCH 29.6 26.0 - 34.0 pg   MCHC 34.3 30.0 - 36.0 g/dL   RDW 13.8 11.5 - 15.5 %   Platelets 149 (L) 150 - 400 K/uL   Neutrophils Relative % 59 %   Neutro Abs 2.4 1.7 - 7.7 K/uL   Lymphocytes Relative 29 %   Lymphs Abs 1.2 0.7 - 4.0 K/uL   Monocytes Relative 8 %   Monocytes Absolute 0.3 0.1 - 1.0 K/uL   Eosinophils Relative 3 %   Eosinophils Absolute 0.1 0.0 - 0.7 K/uL   Basophils Relative 1 %   Basophils Absolute 0.0 0.0 - 0.1 K/uL  Comprehensive metabolic panel     Status: Abnormal   Collection Time: 08/08/15  4:22 PM  Result Value Ref Range   Sodium 141 135 - 145 mmol/L   Potassium 4.6 3.5 - 5.1 mmol/L   Chloride 105 101 - 111 mmol/L   CO2 30 22 - 32 mmol/L   Glucose, Bld 104 (H) 65 - 99 mg/dL   BUN 11 6 - 20 mg/dL   Creatinine, Ser  1.58 (H) 0.61 - 1.24 mg/dL   Calcium 9.1 8.9 - 10.3 mg/dL   Total Protein 6.9 6.5 - 8.1 g/dL   Albumin 3.6 3.5 - 5.0 g/dL   AST 19 15 - 41 U/L   ALT 14 (L) 17 - 63 U/L   Alkaline Phosphatase 72 38 - 126 U/L   Total Bilirubin 1.7 (H) 0.3 - 1.2 mg/dL   GFR calc non Af Amer 44 (L) >60 mL/min   GFR calc Af Amer 51 (L) >60 mL/min    Comment: (NOTE) The eGFR has been calculated using the CKD EPI equation. This calculation has not been validated in all clinical situations. eGFR's persistently <60 mL/min signify possible Chronic Kidney Disease.    Anion gap 6 5 - 15  I-Stat Troponin, ED (not at [facility])     Status: None   Collection Time: 08/08/15  4:27 PM  Result Value Ref Range   Troponin i, poc 0.01 0.00 - 0.08 ng/mL   Comment 3            Comment: Due to the release kinetics of cTnI, a negative result within the first hours of the onset of symptoms does not rule out myocardial infarction with certainty. If myocardial infarction is still suspected, repeat the test at appropriate intervals.    Dg Chest 2 View  08/08/2015   CLINICAL DATA:  Central chest pains and trouble breathing this morning, hypertension, asthma  EXAM: CHEST  2 VIEW  COMPARISON:  07/20/2012  FINDINGS: Upper normal heart size.  Mild tortuosity of thoracic aorta.  Mediastinal contours and pulmonary vascularity normal.  Lungs mildly hyperinflated but clear.  No infiltrate, pleural effusion or pneumothorax.  Bones demineralized.  IMPRESSION: Hyperinflation without acute abnormality.   Electronically Signed   By: Lavonia Dana M.D.   On: 08/08/2015 16:43    Review Of Systems Constitutional: negative for chills, fever, night sweats, weight changes, or fatigue  HEENT: negative for vision changes, hearing loss, congestion, rhinorrhea, ST, epistaxis, or sinus pressure Cardiovascular: negative for chest pain or palpitations Respiratory: negative for hemoptysis, wheezing, shortness of breath, or cough, + chest tightness Abdominal:  negative for abdominal pain, nausea, vomiting, diarrhea, or constipation Dermatological: negative for rash Neurologic: negative for dizziness, or syncope, + headache  Blood pressure 169/96, pulse 71, temperature 98.6 F (37 C), temperature source Oral, resp. rate 18, height $RemoveBe'5\' 8"'bnNnTtoIP$  (1.727 m), weight 73.483  kg (162 lb), SpO2 100 %. General appearance: alert, cooperative, appears stated age and no distress Head: Normocephalic, without obvious abnormality, atraumatic Eyes: Brown eyes, conjunctivae/corneas clear. PERRL, EOM's intact.  Neck: no adenopathy, no carotid bruit, no JVD, trachea midline and thyroid not enlarged. Resp: clear to auscultation bilaterally Cardio: regular rate and rhythm, S1, S2 normal, no murmur, click, rub or gallop Extremities: extremities normal, atraumatic, no cyanosis or edema Neurologic: Alert and oriented X 3, normal strength and tone. Normal symmetric reflexes. Normal coordination and gait.  Assessment/Plan Chest pain r/o MI Hypertension  KADAKIA,AJAY S, MD  08/08/2015, 7:42 PM

## 2015-08-08 NOTE — ED Notes (Signed)
MD at bedside; admitting

## 2015-08-08 NOTE — ED Provider Notes (Signed)
CSN: [id number]     Arrival date & time 08/08/15  1522 History   First MD Initiated Contact with Patient 08/08/15 1524     Chief Complaint  Patient presents with  . Chest Pain  . Hypertension   HPI  Frank Howell is a 64 year old male with PMHx of HTN and HLD presenting with chest pain and hypertension. Pt reports that he takes his blood pressure at home and noticed that it was high for the past two days (high 160s over high 90s). Pt also complaining of chest heaviness for the past two days. Describes the pain as "something heavy sitting on my chest". States the pain has been [place] over the past 2 days. He continued to work out on the treadmill during this time period and states this did not increase the pain. Pt went to his family medicine doctor this morning who sent him here. At the office, he received aspirin and nitro which he reports provided relief of chest heaviness but that it didn't fully resolve it. Denies fevers, chills, diaphoresis, headache, dizziness, syncope, palpitations, SOB, cough, abdominal pain, nausea, vomiting, diarrhea or generalized weakness.   Past Medical History  Diagnosis Date  . Abnormal liver function   . Dermatitis   . Hypertension   . Psoriasis   . Allergy   . Hyperlipidemia   . Asthma     per patient has not had asthma attack in five years  . GERD (gastroesophageal reflux disease)     patient stated GERD is no longer a problem for him   History reviewed. No pertinent past surgical history. Family History  Problem Relation Age of Onset  . Asthma Mother   . Hypertension Sister   . Hypertension Brother    Social History  Substance Use Topics  . Smoking status: Never Smoker   . Smokeless tobacco: Never Used  . Alcohol Use: 0.0 oz/week    0 Standard drinks or equivalent per week     Comment: occasionally    Review of Systems  Constitutional: Negative for fever, chills, diaphoresis and activity change.  Respiratory: Positive for chest tightness.  Negative for shortness of breath.   Cardiovascular: Positive for chest pain. Negative for palpitations.  Gastrointestinal: Negative for nausea, vomiting and abdominal pain.  Neurological: Positive for headaches. Negative for dizziness, syncope, weakness and light-headedness.  All other systems reviewed and are negative.     Allergies  Penicillins  Home Medications   Prior to Admission medications   Medication Sig Start Date End Date Taking? Authorizing Provider  aspirin EC 81 MG tablet Take 81 mg by mouth every 4 (four) hours as needed for mild pain or moderate pain.   Yes Historical Provider, MD  Fluticasone-Salmeterol (ADVAIR DISKUS) 250-50 MCG/DOSE AEPB Inhale 2 puffs into the lungs 2 (two) times daily. Patient taking differently: Inhale 2 puffs into the lungs 2 (two) times daily as needed (shortness of breath).  12/03/14  Yes Thao P Le, DO  losartan (COZAAR) 50 MG tablet Take 1 tablet by mouth daily. 07/30/15  Yes Historical Provider, MD  losartan-hydrochlorothiazide (HYZAAR) 100-12.5 MG per tablet Take 1 tablet by mouth daily. 08/08/15  Yes Robyn Haber, MD  Vitamin D, Ergocalciferol, (DRISDOL) 50000 UNITS CAPS capsule TAKE ONE CAPSULE BY MOUTH EVERY 7 DAYS 07/30/15  Yes Robyn Haber, MD  albuterol (PROVENTIL HFA;VENTOLIN HFA) 108 (90 BASE) MCG/ACT inhaler Inhale 2 puffs into the lungs every 6 (six) hours as needed for wheezing. Patient not taking: Reported on 05/06/2015 05/17/13  Orma Flaming, MD  amLODipine (NORVASC) 10 MG tablet Take 1 tablet (10 mg total) by mouth daily. Patient not taking: Reported on 08/08/2015 12/03/14   Thao P Le, DO  rosuvastatin (CRESTOR) 20 MG tablet Take 1 tablet (20 mg total) by mouth daily. Patient not taking: Reported on 01/20/2015 02/08/14   Posey Boyer, MD  sildenafil (VIAGRA) 100 MG tablet Take 0.5-1 tablets (50-100 mg total) by mouth daily as needed for erectile dysfunction. 04/09/12 05/09/12  Orma Flaming, MD   BP 169/96 mmHg  Pulse 64   Temp(Src) 98.6 F (37 C) (Oral)  Resp 18  Ht 5\' 8"  (1.727 m)  Wt 162 lb (73.483 kg)  BMI 24.64 kg/m2  SpO2 98% Physical Exam  Constitutional: He appears well-developed and well-nourished. No distress.  HENT:  Head: Normocephalic and atraumatic.  Mouth/Throat: Oropharynx is clear and moist. No oropharyngeal exudate.  Eyes: Conjunctivae and EOM are normal. Pupils are equal, round, and reactive to light. Right eye exhibits no discharge. Left eye exhibits no discharge. No scleral icterus.  Neck: Normal range of motion.  Cardiovascular: Normal rate, regular rhythm and normal heart sounds.   Cap refill < 3  Pulmonary/Chest: Effort normal and breath sounds normal. No respiratory distress. He has no wheezes. He has no rales.  Breathing unlabored. CTAB  Abdominal: Soft. He exhibits no distension. There is no tenderness. There is no rebound and no guarding.  Musculoskeletal: Normal range of motion.  Neurological: He is alert. No cranial nerve deficit. Coordination normal.  5/5 motor strength intact BUE BLE. Sensation to light touch intact.   Skin: Skin is warm and dry.  Psychiatric: He has a normal mood and affect. His behavior is normal.  Nursing note and vitals reviewed.   ED Course  Procedures (including critical care time) Labs Review Labs Reviewed  CBC WITH DIFFERENTIAL/PLATELET - Abnormal; Notable for the following:    Platelets 149 (*)    All other components within normal limits  COMPREHENSIVE METABOLIC PANEL - Abnormal; Notable for the following:    Glucose, Bld 104 (*)    Creatinine, Ser 1.58 (*)    ALT 14 (*)    Total Bilirubin 1.7 (*)    GFR calc non Af Amer 44 (*)    GFR calc Af Amer 51 (*)    All other components within normal limits  TROPONIN I  TROPONIN I  TROPONIN I  I-STAT TROPOININ, ED    Imaging Review Dg Chest 2 View  08/08/2015   CLINICAL DATA:  Central chest pains and trouble breathing this morning, hypertension, asthma  EXAM: CHEST  2 VIEW  COMPARISON:   07/20/2012  FINDINGS: Upper normal heart size.  Mild tortuosity of thoracic aorta.  Mediastinal contours and pulmonary vascularity normal.  Lungs mildly hyperinflated but clear.  No infiltrate, pleural effusion or pneumothorax.  Bones demineralized.  IMPRESSION: Hyperinflation without acute abnormality.   Electronically Signed   By: Lavonia Dana M.D.   On: 08/08/2015 16:43   I have personally reviewed and evaluated these images and lab results as part of my medical decision-making.   EKG Interpretation   Date/Time:  Friday August 08 2015 15:29:48 EDT Ventricular Rate:  60 PR Interval:  178 QRS Duration: 76 QT Interval:  413 QTC Calculation: 413 R Axis:   -50 Text Interpretation:  Sinus rhythm Left anterior fascicular block Abnormal  R-wave progression, early transition Borderline T wave abnormalities  Minimal ST elevation, anterior leads Confirmed by LITTLE MD, RACHEL  ([id number]) on  08/08/2015 3:52:16 PM      MDM   Final diagnoses:  Chest pain, unspecified chest pain type   Pt presenting with 2 day history of hypertension and chest heaviness. Seen by family medicine this morning; given nitro and aspirin with pain relief and noted to have abnormal EKG. Sent to ED for cardiac rule out. Consulted to hospitalist and cardiology. Will be admitted to observation telemetry bed with a cardiology consult.    Lahoma Crocker Barrett, PA-C 08/08/15 2154  Ripley Fraise, MD 08/09/15 [phone]

## 2015-08-08 NOTE — H&P (Signed)
[facility] History and Physical  Franciszek Platten EVO:[id number] DOB: Nov 20, 1951 DOA: 08/08/2015  Referring physician: Emergency room PCP: Kennon Portela, MD  Specialists: Cardiology  Chief Complaint: Chest pain  HPI:  Pleasant active 64 year old male  Known history hypertension  No prior history CAD nor family history Psoriasis, allergy, asthma, hyperlipidemia  originally from [place] and currently residing [place] came to the emergency room with chest pain [facility] because he's been having 2/7 history of discomfort in the chest Initially he felt that this was because of heavy callus and a workout at the gym where he was doing weights He's been recently transitioned from losartan/HCTZ and was previously on another agent He has been taking his amlodipine regularly  States his pain has been Fleig without radiation [facility] and was noted to have an apparently abnormal EKG which to my view shows only T-wave flattening in V1  Repeat EKG done in emergency room showed V1 flattening borderline changes in V5 and V6 sinus bradycardia urine flow 0.12 QRS axis minus 45 satisfying left anterior fascicular block no T-wave changes chest x-ray showed hyperinflation without any other changes  Noted mild renal insufficiency and consistence with his CK D stage 2 mild thrombocytopenia 149   Past Medical History  Diagnosis Date  . Abnormal liver function   . Dermatitis   . GERD (gastroesophageal reflux disease)   . Hypertension   . Psoriasis   . Allergy   . Asthma   . Hyperlipidemia    History reviewed. No pertinent past surgical history. 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 Penicillins    REACTION: skin irritation    Family History  Problem Relation Age of Onset  . Asthma Mother   . Hypertension Sister   . Hypertension Brother     Prior to Admission medications   Medication Sig Start Date End Date Taking? Authorizing Provider  aspirin EC 81 MG tablet Take 81 mg by mouth every 4 (four) hours as needed for mild pain or moderate pain.   Yes Historical Provider, MD  Fluticasone-Salmeterol (ADVAIR DISKUS) 250-50 MCG/DOSE AEPB Inhale 2 puffs into the lungs 2 (two) times daily. Patient taking differently: Inhale 2 puffs into the lungs 2 (two) times daily as needed (shortness of breath).  12/03/14  Yes Thao P Le, DO  losartan (COZAAR) 50 MG tablet Take 1 tablet by mouth daily. 07/30/15  Yes Historical Provider, MD  losartan-hydrochlorothiazide (HYZAAR) 100-12.5 MG per tablet Take 1 tablet by mouth daily. 08/08/15  Yes Robyn Haber, MD  Vitamin D, Ergocalciferol, (DRISDOL) 50000 UNITS CAPS capsule TAKE ONE CAPSULE BY MOUTH EVERY 7 DAYS 07/30/15  Yes Robyn Haber, MD  albuterol (PROVENTIL HFA;VENTOLIN HFA) 108 (90 BASE) MCG/ACT inhaler Inhale 2 puffs into the lungs every 6 (six) hours as needed for wheezing. Patient not taking: Reported on 05/06/2015 05/17/13   Orma Flaming, MD  amLODipine (NORVASC) 10 MG tablet Take 1 tablet (10 mg total) by mouth daily. Patient not taking: Reported on 08/08/2015 12/03/14   Thao P Le, DO  rosuvastatin (CRESTOR) 20 MG tablet Take 1 tablet (20 mg total) by mouth daily. Patient not taking: Reported on 01/20/2015 02/08/14   Shanon Brow  Burt Ek, MD  sildenafil (VIAGRA) 100 MG tablet Take 0.5-1 tablets (50-100 mg total) by mouth daily as needed for erectile dysfunction. 04/09/12 05/09/12  Orma Flaming, MD   Physical Exam: Filed Vitals:   08/08/15 1630 08/08/15 1715 08/08/15 1730 08/08/15 1745  BP: 172/98 167/103 163/97 151/79  Pulse: 61 70 66 67  Temp:      TempSrc:      Resp: 23 17 19 15   Height:      Weight:      SpO2: 100% 100% 100% 100%     EOMI  NCAT    good dentition  Looks much younger than stated age  S1-S2 no murmur rub or gallop no JVD  No bruit  Abdomen soft nontender nondistended no rebound  No lower extremity edema however sporadic plaques all over  Power 5/5  Labs on Admission:  Basic Metabolic Panel:  Recent Labs Lab 08/08/15 1622  NA 141  K 4.6  CL 105  CO2 30  GLUCOSE 104*  BUN 11  CREATININE 1.58*  CALCIUM 9.1   Liver Function Tests:  Recent Labs Lab 08/08/15 1622  AST 19  ALT 14*  ALKPHOS 72  BILITOT 1.7*  PROT 6.9  ALBUMIN 3.6   No results for input(s): LIPASE, AMYLASE in the last 168 hours. No results for input(s): AMMONIA in the last 168 hours. CBC:  Recent Labs Lab 08/08/15 1622  WBC 4.1  NEUTROABS 2.4  HGB 15.9  HCT 46.3  MCV 86.2  PLT 149*   Cardiac Enzymes: No results for input(s): CKTOTAL, CKMB, CKMBINDEX, TROPONINI in the last 168 hours.  BNP (last 3 results) No results for input(s): BNP in the last 8760 hours.  ProBNP (last 3 results) No results for input(s): PROBNP in the last 8760 hours.  CBG: No results for input(s): GLUCAP in the last 168 hours.  Radiological Exams on Admission: Dg Chest 2 View  08/08/2015   CLINICAL DATA:  Central chest pains and trouble breathing this morning, hypertension, asthma  EXAM: CHEST  2 VIEW  COMPARISON:  07/20/2012  FINDINGS: Upper normal heart size.  Mild tortuosity of thoracic aorta.  Mediastinal contours and pulmonary vascularity normal.  Lungs mildly hyperinflated but clear.  No infiltrate, pleural effusion or pneumothorax.  Bones demineralized.  IMPRESSION: Hyperinflation without acute abnormality.   Electronically Signed   By: Lavonia Dana M.D.   On: 08/08/2015 16:43    EKG: Independently reviewed.See above Principal Problem:   Chest pain -Patient has some findings concerning for maybe a CNS -His heart score is about 4 -I will start him on metoprolol 25 twice a day and I will hold the morning dose from tomorrow 9/16  AM as per my discussion with cardiologist Dr. Doylene Canard he may benefit from a stress test and will be nothing by mouth after midnight for the -His blood pressure is under control which may also give [place] a reason for his chest pain pressure Active Problems:   GERD -Not on any reflux medications at present   Hypertension - Uncontrolled -Would avoid nitrates in him given he is on sildenafil and we can have protracted and refractory hypotension if we add this on top of what he's taken in the past -May benefit from HCTZ addition to beta blocker and ARB -Next up Would be addition of maybe clonidine   Hyperlipidemia -Apparently is diet-controlled   Psoriasis -Per PCP   presumed full CODE STATUS Admit to telemetry FOR stress test a.m. Likely discharge soon  SAMTANI,  [facility] Pager [phone]  If 7PM-7AM, please contact night-coverage [email or web address] Password [facility] 08/08/2015, 6:26 PM            He has been taking his amlodipine

## 2015-08-08 NOTE — Progress Notes (Signed)
Patient ID: Frank Howell, male   DOB: 1951/09/27, 64 y.o.   MRN: [id number]  This chart was scribed for Frank Haber, MD by Ladene Artist, ED Scribe. The patient was seen in room 13. Patient's care was started at 2:07 PM.  Patient ID: Frank Howell MRN: [id number], DOB: [birth year], 64 y.o. Date of Encounter: 08/08/2015, 2:07 PM  Primary Physician: Kennon Portela, MD  Chief Complaint  Patient presents with  . Hypertension    X 2 days  . Flu Vaccine    HPI: 64 y.o. year old male with history below presents for elevated blood pressure for the past 2 days. Pt's triage BP was 170/110. He states that he does not understand why his BP is elevated when he has not made any dietary or lifestyle changes. Pt reports associated HA last night that has resolved and nonexertional chest tightness that he describes as pressure for the past 2 days. He also states that it "feels like someone has put a heavy load on my chest." No treatments tried PTA. Pt had to postpone his flight to [place] due to elevated BP.   Past Medical History  Diagnosis Date  . Abnormal liver function   . Dermatitis   . GERD (gastroesophageal reflux disease)   . Hypertension   . Psoriasis   . Allergy   . Asthma   . Hyperlipidemia      Home Meds: Prior to Admission medications   Medication Sig Start Date End Date Taking? Authorizing Provider  Fluticasone-Salmeterol (ADVAIR DISKUS) 250-50 MCG/DOSE AEPB Inhale 2 puffs into the lungs 2 (two) times daily. 12/03/14  Yes Thao P Le, DO  losartan (COZAAR) 50 MG tablet Take 1 tablet (50 mg total) by mouth daily. 07/30/15  Yes Frank Haber, MD  Vitamin D, Ergocalciferol, (DRISDOL) 50000 UNITS CAPS capsule TAKE ONE CAPSULE BY MOUTH EVERY 7 DAYS 07/30/15  Yes Frank Haber, MD  albuterol (PROVENTIL HFA;VENTOLIN HFA) 108 (90 BASE) MCG/ACT inhaler Inhale 2 puffs into the lungs every 6 (six) hours as needed for wheezing. Patient not taking: Reported on 05/06/2015 05/17/13   Orma Flaming, MD   amLODipine (NORVASC) 10 MG tablet Take 1 tablet (10 mg total) by mouth daily. Patient not taking: Reported on 08/08/2015 12/03/14   Thao P Le, DO  fluconazole (DIFLUCAN) 200 MG tablet Take 1 tablet (200 mg total) by mouth daily. Patient not taking: Reported on 07/28/2015 05/06/15   Frank Haber, MD  losartan-hydrochlorothiazide [facility]) 100-12.5 MG per tablet Take 1 tablet by mouth daily. Patient not taking: Reported on 08/08/2015 07/28/15   Frank Haber, MD  rosuvastatin (CRESTOR) 20 MG tablet Take 1 tablet (20 mg total) by mouth daily. Patient not taking: Reported on 01/20/2015 02/08/14   Posey Boyer, MD  sildenafil (VIAGRA) 100 MG tablet Take 0.5-1 tablets (50-100 mg total) by mouth daily as needed for erectile dysfunction. 04/09/12 05/09/12  Orma Flaming, MD  tobramycin (TOBREX) 0.3 % ophthalmic solution Place 1 drop into the left eye every 6 (six) hours. Patient not taking: Reported on 07/28/2015 05/06/15   Frank Haber, MD    Allergies:  Allergies  Allergen Reactions  . Penicillins     REACTION: skin irritation    Social History   Social History  . Marital Status: Married    Spouse Name: N/A  . Number of Children: N/A  . Years of Education: N/A   Occupational History  . Not on file.   Social History Main Topics  . Smoking status: Never Smoker   .  Smokeless tobacco: Never Used  . Alcohol Use: 0.0 oz/week    0 Standard drinks or equivalent per week     Comment: occasionally  . Drug Use: No  . Sexual Activity: Not on file   Other Topics Concern  . Not on file   Social History Narrative     Review of Systems: Constitutional: negative for chills, fever, night sweats, weight changes, or fatigue  HEENT: negative for vision changes, hearing loss, congestion, rhinorrhea, ST, epistaxis, or sinus pressure Cardiovascular: negative for chest pain or palpitations Respiratory: negative for hemoptysis, wheezing, shortness of breath, or cough, + chest tightness Abdominal:  negative for abdominal pain, nausea, vomiting, diarrhea, or constipation Dermatological: negative for rash Neurologic: negative for dizziness, or syncope, + headache All other systems reviewed and are otherwise negative with the exception to those above and in the HPI.   Physical Exam: Blood pressure 170/110, pulse 68, temperature 98 F (36.7 C), temperature source Oral, resp. rate 16, height 5' 7.25" (1.708 m), weight 163 lb 9.6 oz (74.208 kg), SpO2 98 %., Body mass index is 25.44 kg/(m^2). General: Well developed, well nourished, in no acute distress. Head: Normocephalic, atraumatic, eyes without discharge, sclera non-icteric, nares are without discharge. Bilateral auditory canals clear, TM's are without perforation, pearly grey and translucent with reflective cone of light bilaterally. Oral cavity moist, posterior pharynx without exudate, erythema, peritonsillar abscess, or post nasal drip.  Neck: Supple. No thyromegaly. Full ROM. No lymphadenopathy. Lungs: Clear bilaterally to auscultation without wheezes, rales, or rhonchi. Breathing is unlabored.  Heart: RRR with S1 S2. No murmurs, rubs, or gallops appreciated. Abdomen: Soft, non-tender, non-distended with normoactive bowel sounds. No hepatomegaly. No rebound/guarding. No obvious abdominal masses. Msk:  Strength and tone normal for age. Extremities/Skin: Warm and dry. No clubbing or cyanosis. No edema. No rashes or suspicious lesions. Good pedal pulses. Neuro: Alert and oriented X 3. Moves all extremities spontaneously. Gait is normal. CNII-XII grossly in tact. Psych:  Responds to questions appropriately with a normal affect.   Course in clinic: Patient had EKG which showed some ST elevations in the anterior leads. He was merely place on oxygen at 2:20 PM and given nitroglycerin and aspirin 325 mg at 2:24 PM. The IV was started at 2:38 PM. Patient experienced dramatic relief by 2:30 PM from his chest pain. He's had no shortness of breath,  nausea, diaphoresis during this period of time. EMT arrived at 2:43 PM.  ASSESSMENT AND PLAN:  64 y.o. year old male with  1. Essential hypertension   2. Chest tightness   3. Need for prophylactic vaccination and inoculation against influenza    This chart was scribed in my presence and reviewed by me personally.    ICD-9-CM ICD-10-CM   1. Essential hypertension 401.9 I10 EKG 12-Lead     Aldosterone + renin activity w/ ratio     Comprehensive metabolic panel     POCT SEDIMENTATION RATE     Lipid panel     losartan-hydrochlorothiazide (HYZAAR) 100-12.5 MG per tablet  2. Chest tightness 786.59 R07.89 EKG 12-Lead  3. Need for prophylactic vaccination and inoculation against influenza V04.81 Z23 Flu Vaccine QUAD 36+ mos IM     Signed, Frank Haber, MD   Signed, Frank Haber, MD 08/08/2015 2:07 PM

## 2015-08-08 NOTE — ED Notes (Addendum)
Patient arrived per EMS c/o of chest pain for the past two days. He went to his primary care this morning, and was hypertensive and referred to ED. EMS administered 2L of O2, 4 aspirin, and 1 nitro; the 1 nitro relieved the chest pain. EMS reported EKG showed slight ST elevation.

## 2015-08-08 NOTE — Patient Instructions (Signed)
Course in clinic: Patient had EKG which showed some ST elevations in the anterior leads. He was merely place on oxygen at 2:20 PM and given nitroglycerin and aspirin 325 mg at 2:24 PM. The IV was started at 2:38 PM. Patient experienced dramatic relief by 2:30 PM from his chest pain. He's had no shortness of breath, nausea, diaphoresis during this period of time. EMT arrived at 2:43 PM.

## 2015-08-09 ENCOUNTER — Inpatient Hospital Stay (HOSPITAL_COMMUNITY): Payer: Medicare Other

## 2015-08-09 DIAGNOSIS — I10 Essential (primary) hypertension: Secondary | ICD-10-CM

## 2015-08-09 DIAGNOSIS — R0789 Other chest pain: Secondary | ICD-10-CM | POA: Diagnosis not present

## 2015-08-09 DIAGNOSIS — R079 Chest pain, unspecified: Secondary | ICD-10-CM

## 2015-08-09 DIAGNOSIS — E785 Hyperlipidemia, unspecified: Secondary | ICD-10-CM

## 2015-08-09 DIAGNOSIS — K219 Gastro-esophageal reflux disease without esophagitis: Secondary | ICD-10-CM

## 2015-08-09 LAB — TROPONIN I: Troponin I: <0.03 ng/mL (ref <0.031)

## 2015-08-09 MED ORDER — TECHNETIUM TC 99M SESTAMIBI - CARDIOLITE
30.0000 | Freq: Once | INTRAVENOUS | Status: AC | PRN
  Administered 2015-08-09: 10:00:00 30 via INTRAVENOUS

## 2015-08-09 MED ORDER — TECHNETIUM TC 99M SESTAMIBI GENERIC - CARDIOLITE
10.0000 | Freq: Once | INTRAVENOUS | Status: AC | PRN
  Administered 2015-08-09: 10 via INTRAVENOUS

## 2015-08-09 NOTE — Progress Notes (Signed)
Patient discharge

## 2015-08-09 NOTE — Discharge Summary (Signed)
Frank Howell  Patient name: Frank Howell Medical record number: [id number] Date of birth: 1950-12-06 Age: 64 y.o. Gender: male Date of Admission: 08/08/2015  Date of Discharge: 08/09/2015 Admitting Physician: Alveda Reasons, MD  Primary Care Provider: Kennon Portela, MD Consultants: Cardiology, Dr. Doylene Canard  Indication for Hospitalization: ACS rule out  Discharge Diagnoses/Problem List:  Chest pain with low risk nuclear stress test HTN HLD GERD Asthma Psoriasis  Disposition: Discharged home  Discharge Condition: Stable  Discharge Exam:  Gen: Well-appearing, muscular 64yo male CV: Regular rate, no murmur, rub or gallop. No edema, no JVD.  Pulm: Nonlabored and clear  Brief Hospital Course:  Frank Howell is a 64 y.o. male presenting to his PCP 6/16 with chest discomfort. He reported [place], nonradiating chest pain described as "heaviness" for the past 2 days which he relates to heavy work out at [employer]. He was sent to the ED after presenting to his PCP, [facility], and found to have ECG changes and pain relieved by NTG and ASA. On arrival troponin was negative x1, ECG showed sinus rhythm with LAFB and poor R wave progression as well as T wave flattening in V1 and early repolarization in anterior leads. He has a history of HTN, HLD; no known work up for CAD, no DM. No family history of early CAD, just HTN in 2 siblings, asthma in mother. Never smoker, occasional EtOH, no illicit drugs. 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    He was admitted to [facility] and transfered to the [facility] the following day. His troponins remained negative and a lexiscan myoview stress test was completed and found to be a low risk result. He has remained chest pain free and no signficant telemetry events have been noted. He will be discharged with no medication changes, advised to follow up with his PCP  within the following week.  Issues for Follow Up:  1. Risk factor optimization: On high intensity statin and will need continued management of HTN.   Significant Procedures: Lexiscan Myoview (results below) 08/09/2015  Significant Labs and Imaging:   Recent Labs Lab 08/08/15 1622  WBC 4.1  HGB 15.9  HCT 46.3  PLT 149*    Recent Labs Lab 08/08/15 1622  NA 141  K 4.6  CL 105  CO2 30  GLUCOSE 104*  BUN 11  CREATININE 1.58*  CALCIUM 9.1  ALKPHOS 72  AST 19  ALT 14*  ALBUMIN 3.6   MYOCARDIAL IMAGING WITH SPECT (REST AND PHARMACOLOGIC-STRESS)  GATED LEFT VENTRICULAR WALL MOTION STUDY  LEFT VENTRICULAR EJECTION FRACTION  TECHNIQUE: Standard myocardial SPECT imaging was performed after resting intravenous injection of 10 mCi Tc-5m sestamibi. Subsequently, intravenous infusion of Lexiscan was performed under the supervision of the Cardiology staff. At peak effect of the drug, 30 mCi Tc-75m sestamibi was injected intravenously and standard myocardial SPECT imaging was performed. Quantitative gated imaging was also performed to evaluate left ventricular wall motion, and estimate left ventricular ejection fraction.  COMPARISON: None.  FINDINGS: Perfusion: Decreased activity in the inferior wall on both sets of images likely due to a combination of diaphragmatic attenuation and a significant amount of splanchnic activity. No reversible defects to suggest ischemia.  Wall Motion: Inferior wall motion abnormality due to splanchnic activity and diaphragmatic attenuation. The remainder of the left ventricle demonstrates normal wall motion and myocardial thickening with contraction.  Left Ventricular Ejection Fraction: 69 %  End diastolic volume 63 ml  End systolic  volume 20 ml  IMPRESSION: 1. No reversible ischemia or infarction. Diaphragmatic attenuation of the inferior wall and significant splanchnic activity.  2. Normal left ventricular wall  motion.  3. Left ventricular ejection fraction 69%  4. Low-risk stress test findings*.  Results/Tests Pending at Time of Discharge: None  Discharge Medications:    Medication List    STOP taking these medications        losartan 50 MG tablet  Commonly known as:  COZAAR     sildenafil 100 MG tablet  Commonly known as:  VIAGRA      TAKE these medications        albuterol 108 (90 BASE) MCG/ACT inhaler  Commonly known as:  PROVENTIL HFA;VENTOLIN HFA  Inhale 2 puffs into the lungs every 6 (six) hours as needed for wheezing.     amLODipine 10 MG tablet  Commonly known as:  NORVASC  Take 1 tablet (10 mg total) by mouth daily.     aspirin EC 81 MG tablet  Take 81 mg by mouth every 4 (four) hours as needed for mild pain or moderate pain.     Fluticasone-Salmeterol 250-50 MCG/DOSE Aepb  Commonly known as:  ADVAIR DISKUS  Inhale 2 puffs into the lungs 2 (two) times daily.     losartan-hydrochlorothiazide 100-12.5 MG per tablet  Commonly known as:  HYZAAR  Take 1 tablet by mouth daily.     rosuvastatin 20 MG tablet  Commonly known as:  CRESTOR  Take 1 tablet (20 mg total) by mouth daily.     Vitamin D (Ergocalciferol) 50000 UNITS Caps capsule  Commonly known as:  DRISDOL  TAKE ONE CAPSULE BY MOUTH EVERY 7 DAYS        Discharge Instructions: Please refer to Patient Instructions section of EMR for full details.  Patient was counseled important signs and symptoms that should prompt return to medical care, changes in medications, dietary instructions, activity restrictions, and follow up appointments.   Follow-Up Appointments:     Follow-up Information    Schedule an appointment as soon as possible for a visit with GUEST, Veneda Melter, MD.   Specialty:  Internal Medicine   Contact information:   [place] [address] [phone]       Frank Pour, MD 08/09/2015, 3:09 PM PGY-3, [place]

## 2015-08-09 NOTE — Progress Notes (Signed)
Assumed care from off going RN @ [phone]; patient alert & oriented times four; patient lying in bed with no c/o's. Denies pain; @0811  patient went down for stress test via wheelchair

## 2015-08-09 NOTE — Discharge Instructions (Signed)
You were evaluated for chest pain and found to have a low risk result of a nuclear stress test. This means that obstructive coronary artery disease is not likely causing your chest pain. You can be discharged and restart taking all medications. Follow up with your PCP in the next 1 - 2 weeks. If you feel chest pain again or shortness of breath, seek medical attention promptly.

## 2015-08-10 ENCOUNTER — Encounter: Payer: Self-pay | Admitting: Family Medicine

## 2015-08-11 DIAGNOSIS — R079 Chest pain, unspecified: Secondary | ICD-10-CM | POA: Insufficient documentation

## 2015-08-11 NOTE — Telephone Encounter (Signed)
Assistant needs to document giving flu shot

## 2015-08-12 ENCOUNTER — Ambulatory Visit (INDEPENDENT_AMBULATORY_CARE_PROVIDER_SITE_OTHER): Payer: Medicare Other | Admitting: Internal Medicine

## 2015-08-12 VITALS — BP 138/90 | HR 89 | Temp 98.3°F | Resp 16 | Ht 68.0 in | Wt 162.2 lb

## 2015-08-12 DIAGNOSIS — J4530 Mild persistent asthma, uncomplicated: Secondary | ICD-10-CM

## 2015-08-12 DIAGNOSIS — Z7189 Other specified counseling: Secondary | ICD-10-CM | POA: Diagnosis not present

## 2015-08-12 DIAGNOSIS — I10 Essential (primary) hypertension: Secondary | ICD-10-CM | POA: Diagnosis not present

## 2015-08-12 DIAGNOSIS — E785 Hyperlipidemia, unspecified: Secondary | ICD-10-CM

## 2015-08-12 MED ORDER — ALBUTEROL SULFATE HFA 108 (90 BASE) MCG/ACT IN AERS: 2.0000 | INHALATION_SPRAY | Freq: Four times a day (QID) | RESPIRATORY_TRACT | Status: DC | PRN

## 2015-08-12 NOTE — Progress Notes (Signed)
Patient ID: Frank Howell, male   DOB: [birth year], 64 y.o.   MRN: [id number]   08/12/2015 at 11:06 AM  [name] / DOB: July 03, 1951 / MRN: [id number]  Problem list reviewed and updated by me where necessary.   SUBJECTIVE  Frank Howell is a 64 y.o. well appearing male presenting for the chief complaint of needs BP follow up.Marland Kitchen     He  has a past medical history of Abnormal liver function; Dermatitis; Hypertension; Psoriasis; Allergy; Hyperlipidemia; Asthma; and GERD (gastroesophageal reflux disease).    Medications reviewed and updated by myself where necessary, and exist elsewhere in the encounter.   Frank Howell is allergic to penicillins. He  reports that he has never smoked. He has never used smokeless tobacco. He reports that he drinks alcohol. He reports that he does not use illicit drugs. He  has no sexual activity history on file. The patient  has no past surgical history on file.  His family history includes Asthma in his mother; Hypertension in his brother and sister.  Review of Systems  Constitutional: Negative for fever.  Respiratory: Negative for shortness of breath.   Cardiovascular: Negative for chest pain.  Gastrointestinal: Negative for nausea.  Skin: Negative for rash.  Neurological: Negative for dizziness and headaches.    OBJECTIVE  His  height is 5\' 8"  (1.727 m) and weight is 162 lb 3.2 oz (73.573 kg). His oral temperature is 98.3 F (36.8 C). His blood pressure is 138/90 and his pulse is 89. His respiration is 16 and oxygen saturation is 94%.  The patient's body mass index is 24.67 kg/(m^2).  Physical Exam  Constitutional: He is oriented to person, place, and time. He appears well-developed and well-nourished. No distress.  HENT:  Head: Normocephalic.  Nose: Nose normal.  Eyes: Conjunctivae and EOM are normal.  Cardiovascular: Normal rate.   Respiratory: Effort normal. No respiratory distress. He has wheezes. He exhibits no tenderness.  Musculoskeletal:  Normal range of motion.  Neurological: He is alert and oriented to person, place, and time. He exhibits normal muscle tone. Coordination normal.  Skin: Rash noted.  Psychiatric: He has a normal mood and affect.   BP 144/94 No results found for this or any previous visit (from the past 24 hour(s)).  ASSESSMENT & PLAN  Frank Howell was seen today for follow-up and medication problem.  Diagnoses and all orders for this visit:  Asthma, chronic, mild persistent, uncomplicated -     albuterol (PROVENTIL HFA;VENTOLIN HFA) 108 (90 BASE) MCG/ACT inhaler; Inhale 2 puffs into the lungs every 6 (six) hours as needed for wheezing.  Essential hypertension  Encounter for medication review and counseling -     albuterol (PROVENTIL HFA;VENTOLIN HFA) 108 (90 BASE) MCG/ACT inhaler; Inhale 2 puffs into the lungs every 6 (six) hours as needed for wheezing.  Hyperlipidemia  Take 1/2 tab of crestor 20mg  qd to reduce side affects Continue losartan 100mg  hctz qam and amlodipine 10mg  qpm for 2-4 more weeks and record BPs from home and report back to [place].

## 2015-08-12 NOTE — Patient Instructions (Addendum)
Rosuvastatin Tablets What is this medicine? ROSUVASTATIN (roe SOO va sta tin) is known as a HMG-CoA reductase inhibitor or 'statin'. It lowers cholesterol and triglycerides in the blood. This drug may also reduce the risk of heart attack, stroke, or other health problems in patients with risk factors for heart disease. Diet and lifestyle changes are often used with this drug. This medicine may be used for other purposes; ask your health care provider or pharmacist if you have questions. COMMON BRAND NAME(S): Crestor What should I tell my health care provider before I take this medicine? They need to know if you have any of these conditions: -frequently drink alcoholic beverages -kidney disease -liver disease -muscle aches or weakness -other medical condition -an unusual or allergic reaction to rosuvastatin, other medicines, foods, dyes, or preservatives -pregnant or trying to get pregnant -breast-feeding How should I use this medicine? Take this medicine by mouth with a glass of water. Follow the directions on the prescription label. Do not cut, crush or chew this medicine. You can take this medicine with or without food. Take your doses at regular intervals. Do not take your medicine more often than directed. Talk to your pediatrician regarding the use of this medicine in children. While this drug may be prescribed for children as young as 86 years old for selected conditions, precautions do apply. Overdosage: If you think you have taken too much of this medicine contact a poison control center or emergency room at once. NOTE: This medicine is only for you. Do not share this medicine with others. What if I miss a dose? If you miss a dose, take it as soon as you can. Do not take 2 doses within 12 hours of each other. If there are less than 12 hours until your next dose, take only that dose. Do not take double or extra doses. What may interact with this medicine? Do not take this medicine with  any of the following medications: -herbal medicines like red yeast rice This medicine may also interact with the following medications: -alcohol -antacids containing aluminum hydroxide or magnesium hydroxide -cyclosporine -other medicines for high cholesterol -some medicines for HIV infection -warfarin This list may not describe all possible interactions. Give your health care provider a list of all the medicines, herbs, non-prescription drugs, or dietary supplements you use. Also tell them if you smoke, drink alcohol, or use illegal drugs. Some items may interact with your medicine. What should I watch for while using this medicine? Visit your doctor or health care professional for regular check-ups. You may need regular tests to make sure your liver is working properly. Tell your doctor or health care professional right away if you get any unexplained muscle pain, tenderness, or weakness, especially if you also have a fever and tiredness. Your doctor or health care professional may tell you to stop taking this medicine if you develop muscle problems. If your muscle problems do not go away after stopping this medicine, contact your health care professional. This medicine may affect blood sugar levels. If you have diabetes, check with your doctor or health care professional before you change your diet or the dose of your diabetic medicine. Avoid taking antacids containing aluminum, calcium or magnesium within 2 hours of taking this medicine. This drug is only part of a total heart-health program. Your doctor or a dietician can suggest a low-cholesterol and low-fat diet to help. Avoid alcohol and smoking, and keep a proper exercise schedule. Do not use this drug if you  are pregnant or breast-feeding. Serious side effects to an unborn child or to an infant are possible. Talk to your doctor or pharmacist for more information. What side effects may I notice from receiving this medicine? Side effects  that you should report to your doctor or health care professional as soon as possible: -allergic reactions like skin rash, itching or hives, swelling of the face, lips, or tongue -dark urine -fever -joint pain -muscle cramps, pain -redness, blistering, peeling or loosening of the skin, including inside the mouth -trouble passing urine or change in the amount of urine -unusually weak or tired -yellowing of the eyes or skin Side effects that usually do not require medical attention (report to your doctor or health care professional if they continue or are bothersome): -constipation -heartburn -nausea -stomach gas, pain, upset This list may not describe all possible side effects. Call your doctor for medical advice about side effects. You may report side effects to FDA at 1-800-FDA-1088. Where should I keep my medicine? Keep out of the reach of children. Store at room temperature between 20 and 25 degrees C (68 and 77 degrees F). Keep container tightly closed (protect from moisture). Throw away any unused medicine after the expiration date. NOTE: This sheet is a summary. It may not cover all possible information. If you have questions about this medicine, talk to your doctor, pharmacist, or health care provider.  2015, [employer]. (2013-06-18 11:36:50) Asthma Attack Prevention Although there is no way to prevent asthma from starting, you can take steps to control the disease and reduce its symptoms. Learn about your asthma and how to control it. Take an active role to control your asthma by working with your health care provider to create and follow an asthma action plan. An asthma action plan guides you in:  Taking your medicines properly.  Avoiding things that set off your asthma or make your asthma worse (asthma triggers).  Tracking your level of asthma control.  Responding to worsening asthma.  Seeking emergency care when needed. To track your asthma, keep records of  your symptoms, check your peak flow number using a handheld device that shows how well air moves out of your lungs (peak flow meter), and get regular asthma checkups.  WHAT ARE SOME WAYS TO PREVENT AN ASTHMA ATTACK?  Take medicines as directed by your health care provider.  Keep track of your asthma symptoms and level of control.  With your health care provider, write a detailed plan for taking medicines and managing an asthma attack. Then be sure to follow your action plan. Asthma is an ongoing condition that needs regular monitoring and treatment.  Identify and avoid asthma triggers. Many outdoor allergens and irritants (such as pollen, mold, cold air, and air pollution) can trigger asthma attacks. Find out what your asthma triggers are and take steps to avoid them.  Monitor your breathing. Learn to recognize warning signs of an attack, such as coughing, wheezing, or shortness of breath. Your lung function may decrease before you notice any signs or symptoms, so regularly measure and record your peak airflow with a home peak flow meter.  Identify and treat attacks early. If you act quickly, you are less likely to have a severe attack. You will also need less medicine to control your symptoms. When your peak flow measurements decrease and alert you to an upcoming attack, take your medicine as instructed and immediately stop any activity that may have triggered the attack. If your symptoms do not improve, get  medical help.  Pay attention to increasing quick-relief inhaler use. If you find yourself relying on your quick-relief inhaler, your asthma is not under control. See your health care provider about adjusting your treatment. WHAT CAN MAKE MY SYMPTOMS WORSE? A number of common things can set off or make your asthma symptoms worse and cause temporary increased inflammation of your airways. Keep track of your asthma symptoms for several weeks, detailing all the environmental and emotional factors  that are linked with your asthma. When you have an asthma attack, go back to your asthma diary to see which factor, or combination of factors, might have contributed to it. Once you know what these factors are, you can take steps to control many of them. If you have allergies and asthma, it is important to take asthma prevention steps at home. Minimizing contact with the substance to which you are allergic will help prevent an asthma attack. Some triggers and ways to avoid these triggers are: Animal Dander:  Some people are allergic to the flakes of skin or dried saliva from animals with fur or feathers.   There is no such thing as a hypoallergenic dog or cat breed. All dogs or cats can cause allergies, even if they don't shed.  Keep these pets out of your home.  If you are not able to keep a pet outdoors, keep the pet out of your bedroom and other sleeping areas at all times, and keep the door closed.  Remove carpets and furniture covered with cloth from your home. If that is not possible, keep the pet away from fabric-covered furniture and carpets. Dust Mites: Many people with asthma are allergic to dust mites. Dust mites are tiny bugs that are found in every home in mattresses, pillows, carpets, fabric-covered furniture, bedcovers, clothes, stuffed toys, and other fabric-covered items.   Cover your mattress in a special dust-proof cover.  Cover your pillow in a special dust-proof cover, or wash the pillow each week in hot water. Water must be hotter than 130 F (54.4 C) to kill dust mites. Cold or warm water used with detergent and bleach can also be effective.  Wash the sheets and blankets on your bed each week in hot water.  Try not to sleep or lie on cloth-covered cushions.  Call ahead when traveling and ask for a smoke-free hotel room. Bring your own bedding and pillows in case the hotel only supplies feather pillows and down comforters, which may contain dust mites and cause asthma  symptoms.  Remove carpets from your bedroom and those laid on concrete, if you can.  Keep stuffed toys out of the bed, or wash the toys weekly in hot water or cooler water with detergent and bleach. Cockroaches: Many people with asthma are allergic to the droppings and remains of cockroaches.   Keep food and garbage in closed containers. Never leave food out.  Use poison baits, traps, powders, gels, or paste (for example, boric acid).  If a spray is used to kill cockroaches, stay out of the room until the odor goes away. Indoor Mold:  Fix leaky faucets, pipes, or other sources of water that have mold around them.  Clean floors and moldy surfaces with a fungicide or diluted bleach.  Avoid using humidifiers, vaporizers, or swamp coolers. These can spread molds through the air. Pollen and Outdoor Mold:  When pollen or mold spore counts are high, try to keep your windows closed.  Stay indoors with windows closed from late morning to afternoon.  Pollen and some mold spore counts are highest at that time.  Ask your health care provider whether you need to take anti-inflammatory medicine or increase your dose of the medicine before your allergy season starts. Other Irritants to Avoid:  Tobacco smoke is an irritant. If you smoke, ask your health care provider how you can quit. Ask family members to quit smoking, too. Do not allow smoking in your home or car.  If possible, do not use a wood-burning stove, kerosene heater, or fireplace. Minimize exposure to all sources of smoke, including incense, candles, fires, and fireworks.  Try to stay away from strong odors and sprays, such as perfume, talcum powder, hair spray, and paints.  Decrease humidity in your home and use an indoor air cleaning device. Reduce indoor humidity to below 60%. Dehumidifiers or central air conditioners can do this.  Decrease house dust exposure by changing furnace and air cooler filters frequently.  Try to have  someone else vacuum for you once or twice a week. Stay out of rooms while they are being vacuumed and for a short while afterward.  If you vacuum, use a dust mask from a hardware store, a double-layered or microfilter vacuum cleaner bag, or a vacuum cleaner with a HEPA filter.  Sulfites in foods and beverages can be irritants. Do not drink beer or wine or eat dried fruit, processed potatoes, or shrimp if they cause asthma symptoms.  Cold air can trigger an asthma attack. Cover your nose and mouth with a scarf on cold or windy days.  Several health conditions can make asthma more difficult to manage, including a runny nose, sinus infections, reflux disease, psychological stress, and sleep apnea. Work with your health care provider to manage these conditions.  Avoid close contact with people who have a respiratory infection such as a cold or the flu, since your asthma symptoms may get worse if you catch the infection. Wash your hands thoroughly after touching items that may have been handled by people with a respiratory infection.  Get a flu shot every year to protect against the flu virus, which often makes asthma worse for days or weeks. Also get a pneumonia shot if you have not previously had one. Unlike the flu shot, the pneumonia shot does not need to be given yearly. Medicines:  Talk to your health care provider about whether it is safe for you to take aspirin or non-steroidal anti-inflammatory medicines (NSAIDs). In a small number of people with asthma, aspirin and NSAIDs can cause asthma attacks. These medicines must be avoided by people who have known aspirin-sensitive asthma. It is important that people with aspirin-sensitive asthma read labels of all over-the-counter medicines used to treat pain, colds, coughs, and fever.  Beta-blockers and ACE inhibitors are other medicines you should discuss with your health care provider. HOW CAN I FIND OUT WHAT I AM ALLERGIC TO? Ask your asthma health  care provider about allergy skin testing or blood testing (the RAST test) to identify the allergens to which you are sensitive. If you are found to have allergies, the most important thing to do is to try to avoid exposure to any allergens that you are sensitive to as much as possible. Other treatments for allergies, such as medicines and allergy shots (immunotherapy) are available.  CAN I EXERCISE? Follow your health care provider's advice regarding asthma treatment before exercising. It is important to maintain a regular exercise program, but vigorous exercise or exercise in cold, humid, or dry environments can cause  asthma attacks, especially for those people who have exercise-induced asthma. Document Released: 10/27/2009 Document Revised: 11/13/2013 Document Reviewed: 05/16/2013 [facility] Patient Information 2015 [place], [place]. This information is not intended to replace advice given to you by your health care provider. Make sure you discuss any questions you have with your health care provider. Psoriasis Psoriasis is a common, long-lasting (chronic) inflammation of the skin. It affects both men and women equally, of all ages and all races. Psoriasis cannot be passed from person to person (not contagious). Psoriasis varies from mild to very severe. When severe, it can greatly affect your quality of life. Psoriasis is an inflammatory disorder affecting the skin as well as other organs including the joints (causing an arthritis). With psoriasis, the skin sheds its top layer of cells more rapidly than it does in someone without psoriasis. CAUSES  The cause of psoriasis is largely unknown. Genetics, your immune system, and the environment seem to play a role in causing psoriasis. Factors that can make psoriasis worse include:  Damage or trauma to the skin, such as cuts, scrapes, and sunburn. This damage often causes new areas of psoriasis (lesions).  Winter dryness and lack of sunlight.  Medicines  such as lithium, beta-blockers, antimalarial drugs, ACE inhibitors, nonsteroidal anti-inflammatory drugs (ibuprofen, aspirin), and terbinafine. Let your caregiver know if you are taking any of these drugs.  Alcohol. Excessive alcohol use should be avoided if you have psoriasis. Drinking large amounts of alcohol can affect:  How well your psoriasis treatment works.  How safe your psoriasis treatment is.  Smoking. If you smoke, ask your caregiver for help to quit.  Stress.  Bacterial or viral infections.  Arthritis. Arthritis associated with psoriasis (psoriatic arthritis) affects less than 10% of patients with psoriasis. The arthritic intensity does not always match the skin psoriasis intensity. It is important to let your caregiver know if your joints hurt or if they are stiff. SYMPTOMS  The most common form of psoriasis begins with little red bumps that gradually become larger. The bumps begin to form scales that flake off easily. The lower layers of scales stick together. When these scales are scratched or removed, the underlying skin is tender and bleeds easily. These areas then grow in size and may become large. Psoriasis often creates a rash that looks the same on both sides of the body (symmetrical). It often affects the elbows, knees, groin, genitals, arms, legs, scalp, and nails. Affected nails often have pitting, loosen, thicken, crumble, and are difficult to treat.  "Inverse psoriasis"occurs in the armpits, under breasts, in skin folds, and around the groin, buttocks, and genitals.  "Guttate psoriasis" generally occurs in children and young adults following a recent sore throat (strep throat). It begins with many small, red, scaly spots on the skin. It clears spontaneously in weeks or a few months without treatment. DIAGNOSIS  Psoriasis is diagnosed by physical exam. A tissue sample (biopsy) may also be taken. TREATMENT The treatment of psoriasis depends on your age, health, and  living conditions.  Steroid (cortisone) creams, lotions, and ointments may be used. These treatments are associated with thinning of the skin, blood vessels that get larger (dilated), loss of skin pigmentation, and easy bruising. It is important to use these steroids as directed by your caregiver. Only treat the affected areas and not the normal, unaffected skin. People on long-term steroid treatment should wear a medical alert bracelet. Injections may be used in areas that are difficult to treat.  Scalp treatments are available as  shampoos, solutions, sprays, foams, and oils. Avoid scratching the scalp and picking at the scales.  Anthralin medicine works well on areas that are difficult to treat. However, it stains clothes and skin and may cause temporary irritation.  Synthetic vitamin D (calcipotriene)can be used on small areas. It is available by prescription. The forms of synthetic vitamin D available in health food stores do not help with psoriasis.  Coal tarsare available in various strengths for psoriasis that is difficult to treat. They are one of the longest used treatments for difficult to treat psoriasis. However, they are messy to use.  Light therapy (UV therapy) can be carefully and professionally monitored in a dermatologist's office. Careful sunbathing is helpful for many people as directed by your caregiver. The exposure should be just long enough to cause a mild redness (erythema) of your skin. Avoid sunburn as this may make the condition worse. Sunscreen (SPF of 30 or higher) should be used to protect against sunburn. Cataracts, wrinkles, and skin aging are some of the harmful side effects of light therapy.  If creams (topical medicines) fail, there are several other options for systemic or oral medicines your caregiver can suggest. Psoriasis can sometimes be very difficult to treat. It can come and go. It is necessary to follow up with your caregiver regularly if your psoriasis is  difficult to treat. Usually, with persistence you can get a good amount of relief. Maintaining consistent care is important. Do not change caregivers just because you do not see immediate results. It may take several trials to find the right combination of treatment for you. PREVENTING FLARE-UPS  Wear gloves while you wash dishes, while cleaning, and when you are outside in the cold.  If you have radiators, place a bowl of water or damp towel on the radiator. This will help put water back in the air. You can also use a humidifier to keep the air moist. Try to keep the humidity at about 60% in your home.  Apply moisturizer while your skin is still damp from bathing or showering. This traps water in the skin.  Avoid long, hot baths or showers. Keep soap use to a minimum. Soaps dry out the skin and wash away the protective oils. Use a fragrance free, dye free soap.  Drink enough water and fluids to keep your urine clear or pale yellow. Not drinking enough water depletes your skin's water supply.  Turn off the heat at night and keep it low during the day. Cool air is less drying. SEEK MEDICAL CARE IF:  You have increasing pain in the affected areas.  You have uncontrolled bleeding in the affected areas.  You have increasing redness or warmth in the affected areas.  You start to have pain or stiffness in your joints.  You start feeling depressed about your condition.  You have a fever. Document Released: 11/05/2000 Document Revised: 01/31/2012 Document Reviewed: 05/03/2011 [facility] Patient Information 2015 [place], [place]. This information is not intended to replace advice given to you by your health care provider. Make sure you discuss any questions you have with your health care provider.

## 2015-09-16 ENCOUNTER — Ambulatory Visit (INDEPENDENT_AMBULATORY_CARE_PROVIDER_SITE_OTHER): Payer: Medicare Other | Admitting: Family Medicine

## 2015-09-16 VITALS — BP 138/82 | HR 76 | Temp 97.7°F | Resp 16 | Ht 68.0 in | Wt 168.0 lb

## 2015-09-16 DIAGNOSIS — E785 Hyperlipidemia, unspecified: Secondary | ICD-10-CM | POA: Diagnosis not present

## 2015-09-16 DIAGNOSIS — L309 Dermatitis, unspecified: Secondary | ICD-10-CM | POA: Diagnosis not present

## 2015-09-16 MED ORDER — PREDNISONE 20 MG PO TABS: ORAL_TABLET | ORAL | Status: DC

## 2015-09-16 MED ORDER — FLUOCINONIDE-E 0.05 % EX CREA: 1.0000 "application " | TOPICAL_CREAM | Freq: Two times a day (BID) | CUTANEOUS | Status: DC

## 2015-09-16 MED ORDER — ROSUVASTATIN CALCIUM 5 MG PO TABS: 5.0000 mg | ORAL_TABLET | Freq: Every day | ORAL | Status: DC

## 2015-09-16 NOTE — Patient Instructions (Signed)
Eczema Eczema, also called atopic dermatitis, is a skin disorder that causes inflammation of the skin. It causes a red rash and dry, scaly skin. The skin becomes very itchy. Eczema is generally worse during the cooler winter months and often improves with the warmth of summer. Eczema usually starts showing signs in infancy. Some children outgrow eczema, but it may last through adulthood.  CAUSES  The exact cause of eczema is not known, but it appears to run in families. People with eczema often have a family history of eczema, allergies, asthma, or hay fever. Eczema is not contagious. Flare-ups of the condition may be caused by:   Contact with something you are sensitive or allergic to.   Stress. SIGNS AND SYMPTOMS  Dry, scaly skin.   Red, itchy rash.   Itchiness. This may occur before the skin rash and may be very intense.  DIAGNOSIS  The diagnosis of eczema is usually made based on symptoms and medical history. TREATMENT  Eczema cannot be cured, but symptoms usually can be controlled with treatment and other strategies. A treatment plan might include:  Controlling the itching and scratching.   Use over-the-counter antihistamines as directed for itching. This is especially useful at night when the itching tends to be worse.   Use over-the-counter steroid creams as directed for itching.   Avoid scratching. Scratching makes the rash and itching worse. It may also result in a skin infection (impetigo) due to a break in the skin caused by scratching.   Keeping the skin well moisturized with creams every day. This will seal in moisture and help prevent dryness. Lotions that contain alcohol and water should be avoided because they can dry the skin.   Limiting exposure to things that you are sensitive or allergic to (allergens).   Recognizing situations that cause stress.   Developing a plan to manage stress.  HOME CARE INSTRUCTIONS   Only take over-the-counter or  prescription medicines as directed by your health care provider.   Do not use anything on the skin without checking with your health care provider.   Keep baths or showers short (5 minutes) in warm (not hot) water. Use mild cleansers for bathing. These should be unscented. You may add nonperfumed bath oil to the bath water. It is best to avoid soap and bubble bath.   Immediately after a bath or shower, when the skin is still damp, apply a moisturizing ointment to the entire body. This ointment should be a petroleum ointment. This will seal in moisture and help prevent dryness. The thicker the ointment, the better. These should be unscented.   Keep fingernails cut short. Children with eczema may need to wear soft gloves or mittens at night after applying an ointment.   Dress in clothes made of cotton or cotton blends. Dress lightly, because heat increases itching.   A child with eczema should stay away from anyone with fever blisters or cold sores. The virus that causes fever blisters (herpes simplex) can cause a serious skin infection in children with eczema. SEEK MEDICAL CARE IF:   Your itching interferes with sleep.   Your rash gets worse or is not better within 1 week after starting treatment.   You see pus or soft yellow scabs in the rash area.   You have a fever.   You have a rash flare-up after contact with someone who has fever blisters.    This information is not intended to replace advice given to you by your health care   provider. Make sure you discuss any questions you have with your health care provider.   Document Released: 11/05/2000 Document Revised: 08/29/2013 Document Reviewed: 06/11/2013 Elsevier Interactive Patient Education 2016 [employer]  

## 2015-09-16 NOTE — Progress Notes (Signed)
64 year old gentleman from [place] who works in [place] from his home. He came back from [place] several days ago with a rash of 2 weeks duration on the palms of his hands with diffuse peeling and cracking. He's not aware of exposure to any new chemical although he was washing his brother's driveway with water extensively.  Patient has a history of psoriasis and is about to embark on a Humira regimen.  Patient has a history of hyperlipidemia and would like to restart his Crestor at a lower dose.  Objective: No acute distress BP 138/82 mmHg  Pulse 76  Temp(Src) 97.7 F (36.5 C) (Oral)  Resp 16  Ht 5\' 8"  (1.727 m)  Wt 168 lb (76.204 kg)  BMI 25.55 kg/m2  SpO2 98% Examination of the palms reveals diffuse scaling of the fingers and palms on the volar side. He has minimal irritation on his dorsal hands and no rash on his forearms.  Patient does have thickened skin over the anterior shins consistent with partially treated psoriasis.  Assessment: Eczema, hyperlipidemia  Plan: Patient will start prednisone and Lidex cream because of the dramatic, confluent areas of eczema. He'll let me know if he is not better in a week.     ICD-9-CM ICD-10-CM   1. Eczema 692.9 L30.9 predniSONE (DELTASONE) 20 MG tablet     fluocinonide-emollient (LIDEX-E) 0.05 % cream  2. Hyperlipidemia 272.4 E78.5 rosuvastatin (CRESTOR) 5 MG tablet     Signed, Robyn Haber, MD

## 2015-10-14 ENCOUNTER — Telehealth: Payer: Self-pay

## 2015-10-14 NOTE — Telephone Encounter (Signed)
PATIENT STATES HE WOULD JUST LIKE TO KNOW IF HE HAS EVER HAD HIS BLOOD SUGAR CHECKED HERE? BEST PHONE [phone] (CELL)  [place]

## 2015-10-14 NOTE — Telephone Encounter (Signed)
Advised pt that he has had his glucose done in different blood tests and they have been normal.  He was doing research and stated that high insulin levels can cause blood pressure problems.  Advised pt again that blood glucose levels have been normal and he can come in to do insulin tests and he can speak with a provider.

## 2015-11-12 ENCOUNTER — Encounter: Payer: Self-pay | Admitting: Internal Medicine

## 2015-12-31 ENCOUNTER — Other Ambulatory Visit: Payer: Self-pay | Admitting: Family Medicine

## 2016-01-10 ENCOUNTER — Ambulatory Visit (INDEPENDENT_AMBULATORY_CARE_PROVIDER_SITE_OTHER): Payer: Medicare Other | Admitting: Family Medicine

## 2016-01-10 VITALS — BP 128/82 | HR 81 | Temp 98.6°F | Resp 16 | Ht 68.0 in | Wt 167.0 lb

## 2016-01-10 DIAGNOSIS — B349 Viral infection, unspecified: Secondary | ICD-10-CM | POA: Diagnosis not present

## 2016-01-10 DIAGNOSIS — J069 Acute upper respiratory infection, unspecified: Secondary | ICD-10-CM | POA: Diagnosis not present

## 2016-01-10 DIAGNOSIS — R05 Cough: Secondary | ICD-10-CM

## 2016-01-10 DIAGNOSIS — R059 Cough, unspecified: Secondary | ICD-10-CM

## 2016-01-10 MED ORDER — HYDROCODONE-HOMATROPINE 5-1.5 MG/5ML PO SYRP: 5.0000 mL | ORAL_SOLUTION | ORAL | Status: DC | PRN

## 2016-01-10 MED ORDER — IPRATROPIUM BROMIDE 0.03 % NA SOLN: 2.0000 | Freq: Two times a day (BID) | NASAL | Status: DC

## 2016-01-10 NOTE — Progress Notes (Signed)
Patient ID: Frank Howell, male    DOB: 1951-07-30  Age: 65 y.o. MRN: [id number]  Chief Complaint  Patient presents with  . Cough    Productive, x 3days  . Nasal Congestion  . Chills    Subjective:   65 year old gentleman previously known to me. He has been sick for several days with head congestion and coughing and has had some chills. He does not have a documented fever. He did have a flu shot this year. Generally he is a healthy man. He has mild aching.  Current allergies, medications, problem list, past/family and social histories reviewed.  Objective:  BP 128/82 mmHg  Pulse 81  Temp(Src) 98.6 F (37 C) (Oral)  Resp 16  Ht 5\' 8"  (1.727 m)  Wt 167 lb (75.751 kg)  BMI 25.40 kg/m2  SpO2 96%  Alert and oriented. TMs normal. Throat not erythematous. Neck supple without significant nodes. Chest is clear to auscultation. Heart regular without murmur.  Assessment & Plan:   Assessment: 1. Cough   2. Acute upper respiratory infection   3. Viral syndrome       Plan: Treat symptomatically. No diagnostic testing needed today.  No orders of the defined types were placed in this encounter.    Meds ordered this encounter  Medications  . HYDROcodone-homatropine (HYCODAN) 5-1.5 MG/5ML syrup    Sig: Take 5 mLs by mouth every 4 (four) hours as needed.    Dispense:  120 mL    Refill:  0  . ipratropium (ATROVENT) 0.03 % nasal spray    Sig: Place 2 sprays into both nostrils 2 (two) times daily.    Dispense:  30 mL    Refill:  0         Patient Instructions  Drink plenty of fluids and get enough rest  Use the ipratropium nose spray 2 sprays each nostril 4 times daily as needed for head congestion. The container will save to use 2 times daily, but you can use it as often as 4 times daily.  Take the Hycodan cough syrup 1 teaspoon every 4-6 hours as needed for cough  You can also take an over-the-counter antihistamine decongestant such as Claritin-D or Allegra-D or Zyrtec-D  (loratadine, fexofenadine, cetirizine) if needed for head stuffiness and drainage.  Take Tylenol 500 mg 2 tablets 3 times daily or ibuprofen 200 mg 3 tablets 3 times daily if needed for fever or body aches  Return if worse     Return if symptoms worsen or fail to improve.   Anibal Quinby, MD 01/10/2016

## 2016-01-10 NOTE — Patient Instructions (Signed)
Drink plenty of fluids and get enough rest  Use the ipratropium nose spray 2 sprays each nostril 4 times daily as needed for head congestion. The container will save to use 2 times daily, but you can use it as often as 4 times daily.  Take the Hycodan cough syrup 1 teaspoon every 4-6 hours as needed for cough  You can also take an over-the-counter antihistamine decongestant such as Claritin-D or Allegra-D or Zyrtec-D (loratadine, fexofenadine, cetirizine) if needed for head stuffiness and drainage.  Take Tylenol 500 mg 2 tablets 3 times daily or ibuprofen 200 mg 3 tablets 3 times daily if needed for fever or body aches  Return if worse

## 2016-04-29 ENCOUNTER — Encounter: Payer: Self-pay | Admitting: Family Medicine

## 2016-04-29 ENCOUNTER — Ambulatory Visit (INDEPENDENT_AMBULATORY_CARE_PROVIDER_SITE_OTHER): Payer: Medicare Other | Admitting: Family Medicine

## 2016-04-29 VITALS — BP 147/94 | HR 72 | Temp 98.3°F | Resp 16 | Ht 67.75 in | Wt 163.0 lb

## 2016-04-29 DIAGNOSIS — E785 Hyperlipidemia, unspecified: Secondary | ICD-10-CM | POA: Diagnosis not present

## 2016-04-29 DIAGNOSIS — J4520 Mild intermittent asthma, uncomplicated: Secondary | ICD-10-CM

## 2016-04-29 DIAGNOSIS — Z1159 Encounter for screening for other viral diseases: Secondary | ICD-10-CM

## 2016-04-29 DIAGNOSIS — Z23 Encounter for immunization: Secondary | ICD-10-CM | POA: Diagnosis not present

## 2016-04-29 DIAGNOSIS — R7989 Other specified abnormal findings of blood chemistry: Secondary | ICD-10-CM

## 2016-04-29 DIAGNOSIS — Z0000 Encounter for general adult medical examination without abnormal findings: Secondary | ICD-10-CM

## 2016-04-29 DIAGNOSIS — Z125 Encounter for screening for malignant neoplasm of prostate: Secondary | ICD-10-CM

## 2016-04-29 DIAGNOSIS — R748 Abnormal levels of other serum enzymes: Secondary | ICD-10-CM

## 2016-04-29 DIAGNOSIS — Z130 Encounter for screening for diseases of the blood and blood-forming organs and certain disorders involving the immune mechanism: Secondary | ICD-10-CM

## 2016-04-29 DIAGNOSIS — Z79899 Other long term (current) drug therapy: Secondary | ICD-10-CM

## 2016-04-29 DIAGNOSIS — I10 Essential (primary) hypertension: Secondary | ICD-10-CM | POA: Diagnosis not present

## 2016-04-29 MED ORDER — BECLOMETHASONE DIPROPIONATE 80 MCG/ACT IN AERS: 1.0000 | INHALATION_SPRAY | Freq: Two times a day (BID) | RESPIRATORY_TRACT | Status: DC

## 2016-04-29 MED ORDER — ZOSTER VACCINE LIVE 19400 UNT/0.65ML ~~LOC~~ SUSR: 0.6500 mL | Freq: Once | SUBCUTANEOUS | Status: DC

## 2016-04-29 MED ORDER — LOSARTAN POTASSIUM-HCTZ 100-12.5 MG PO TABS: 1.0000 | ORAL_TABLET | Freq: Every day | ORAL | Status: DC

## 2016-04-29 NOTE — Progress Notes (Signed)
Subjective:  By signing my name below, I, Frank Howell, attest that this documentation has been prepared under the direction and in the presence of [name], MD. Electronically Signed: [name], [place]. 04/29/2016 , 11:58 AM .  Patient was seen in Room 25 .   Patient ID: Frank Howell, male    DOB: December 27, 1950, 65 y.o.   MRN: [id number] Chief Complaint  Patient presents with  . Annual Exam   HPI [name] is a 65 y.o. male Here for annual physical. Previous patient of Dr. Joseph Art and Dr. Elder Cyphers. He has a history of HTN, HLD, asthma, and GERD.   He's been fasting since 4:00PM yesterday.   Left testicular pain Patient states having intermittent left testicular pain. He's seen his urologist regarding the testicular pain about a month ago. He believes it is due to lifting exercises. His urologist did not have scans or testing done. He denies pain at this moment.   Cancer Screening Colonoscopy: December 2011, tubular adenoma, repeat in 5 years; he plans to call to make an appointment.  Prostate screening:  Lab Results  Component Value Date   PSA 3.17 12/03/2014   PSA 2.10 05/17/2013    Immunizations Immunization History  Administered Date(s) Administered  . Influenza,inj,Quad PF,36+ Mos 08/09/2015  . Pneumococcal Polysaccharide-23 03/27/2012  . Tdap 11/19/2009   He is due for prevnar and due for shingles vaccinations.   Depression Depression screen [facility] 2/9 04/29/2016 01/10/2016 09/16/2015 08/12/2015 08/08/2015  Decreased Interest 0 0 0 0 0  Down, Depressed, Hopeless 0 0 0 0 0  PHQ - 2 Score 0 0 0 0 0    Fall Screening No falls in the past year.   Functional status survey No positive in the past year.   Vision  Visual Acuity Screening   Right eye Left eye Both eyes  Without correction: 20/25 20/30 20/20   With correction:      He sees his eye doctor once a year.   Dentist He denies seeing a dentist in a while. He's recommended to see dentist once every  6 months.   Advanced Directives He states he has a will.  He believes he has a living will, and will bring it in to the office to scan next time.   Exercise He's exercising most days of the week with weight lifting as well. He denies chest pain with exercise.   HTN Lab Results  Component Value Date   CREATININE 1.58* 08/08/2015   This was slightly elevated from 1.46 in Jan 2016. He was admitted in sept 2016 for rule out ACS with chest pain, he had low risk nuclear stress test done, negative troponin. Chest pain free and no significant tropometry events. He denies taking aspirin.   He still takes losartan, unchanged. He does notice his BP has been elevated. He denies any complications with his BP medication.   HLD Lab Results  Component Value Date   CHOL 175 12/03/2014   HDL 50 12/03/2014   LDLCALC 102* 12/03/2014   TRIG 117 12/03/2014   CHOLHDL 3.5 12/03/2014   Lab Results  Component Value Date   ALT 14* 08/08/2015   AST 19 08/08/2015   ALKPHOS 72 08/08/2015   BILITOT 1.7* 08/08/2015   Appears he was taking crestor in the past. He was not taking his crestor during previous labs.   Asthma He has albuterol as needed and takes advair 250-50 bid.   Patient states his asthma is doing well. He is currently taking  advair once every other day. He denies using albuterol recently.   Psoriasis Has used lidex cream or diprolene cream as needed.   He denies using lidex cream. He was receiving humera injection biweekly 40mg . Now, he's doing it once a month because of improvement. His dermatologist is Dr. Sydnee Levans.   HIV screening Patient declines this today. There is no recent testings seen.   Hep C Screening He does agree to this today.   Patient Active Problem List   Diagnosis Date Noted  . Pain in the chest   . Chest pain 08/08/2015  . Psoriasis 08/08/2015  . Asthma 01/10/2012  . Hypertension 01/10/2012  . Hyperlipidemia 01/10/2012  . GERD 01/20/2010  .  FECAL OCCULT Howell 01/20/2010   Past Medical History  Diagnosis Date  . Abnormal liver function   . Dermatitis   . Hypertension   . Psoriasis   . Allergy   . Hyperlipidemia   . Asthma     per patient has not had asthma attack in five years  . GERD (gastroesophageal reflux disease)     patient stated GERD is no longer a problem for him   No past surgical history on file. Allergies  Allergen Reactions  . Penicillins     REACTION: skin irritation   Prior to Admission medications   Medication Sig Start Date End Date Taking? Authorizing Provider  ADVAIR DISKUS 250-50 MCG/DOSE AEPB INHALE 2 PUFFS INTO THE LUNGS 2 (TWO) TIMES DAILY. 12/31/15   Thao P Le, DO  albuterol (PROVENTIL HFA;VENTOLIN HFA) 108 (90 BASE) MCG/ACT inhaler Inhale 2 puffs into the lungs every 6 (six) hours as needed for wheezing. Patient not taking: Reported on 01/10/2016 08/12/15   Orma Flaming, MD  aspirin EC 81 MG tablet Take 81 mg by mouth every 4 (four) hours as needed for mild pain or moderate pain. Reported on 01/10/2016    Historical Provider, MD  fluocinonide-emollient (LIDEX-E) 0.05 % cream Apply 1 application topically 2 (two) times daily. 09/16/15   Robyn Haber, MD  HYDROcodone-homatropine [facility]) 5-1.5 MG/5ML syrup Take 5 mLs by mouth every 4 (four) hours as needed. 01/10/16   Posey Boyer, MD  ipratropium (ATROVENT) 0.03 % nasal spray Place 2 sprays into both nostrils 2 (two) times daily. 01/10/16   Posey Boyer, MD  losartan-hydrochlorothiazide (HYZAAR) 100-12.5 MG per tablet Take 1 tablet by mouth daily. 08/08/15   Robyn Haber, MD  rosuvastatin (CRESTOR) 5 MG tablet Take 1 tablet (5 mg total) by mouth daily. 09/16/15   Robyn Haber, MD  Vitamin D, Ergocalciferol, (DRISDOL) 50000 UNITS CAPS capsule TAKE ONE CAPSULE BY MOUTH EVERY 7 DAYS 07/30/15   Robyn Haber, MD   Social History   Social History  . Marital Status: Married    Spouse Name: N/A  . Number of Children: N/A  . Years of  Education: N/A   Occupational History  . Not on file.   Social History Main Topics  . Smoking status: Never Smoker   . Smokeless tobacco: Never Used  . Alcohol Use: 0.0 oz/week    0 Standard drinks or equivalent per week     Comment: occasionally  . Drug Use: No  . Sexual Activity: Not on file   Other Topics Concern  . 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 He appears well-developed and well-nourished.  HENT:  Head: Normocephalic and atraumatic.  Right Ear: External ear normal.  Left Ear: External ear normal.  Mouth/Throat: Oropharynx is clear and moist.  Eyes: Conjunctivae and EOM are normal. Pupils are equal, round, and reactive to light.  Neck: Normal range of motion. Neck supple. No thyromegaly present.  Cardiovascular: Normal rate, regular rhythm, normal heart sounds and intact distal pulses.   Pulmonary/Chest: Effort normal and breath sounds normal. No respiratory distress. He has no wheezes.  Abdominal: Soft. He exhibits no distension. There is no tenderness. Hernia confirmed negative in the right inguinal area and confirmed negative in the left inguinal area.  Genitourinary: Prostate normal.  Prostate was firm on inferior aspect without solitary nodule appreciated, non tender Testicles non tender, no masses palpated  Musculoskeletal: Normal range of motion. He exhibits no edema or tenderness.  Lymphadenopathy:    He has no cervical adenopathy.  Neurological: He is alert and oriented to person, place, and time. He has normal reflexes.  Skin: Skin is warm and dry.  Hyperpigmented skin lower extremities as well as hyperpigmented and dry scaly on olecranons, elbows  and lateral knees  Psychiatric: He has a normal mood and affect. His behavior is normal.  Vitals reviewed.    Filed Vitals:   04/29/16 1037  BP: 147/94  Pulse: 72  Temp: 98.3 F (36.8 C)  TempSrc: Oral  Resp: 16  Height: 5' 7.75" (1.721 m)  Weight: 163 lb (73.936 kg)  SpO2: 98%      Assessment & Plan:   Bayardo Nassif is a 65 y.o. male Annual physical exam  --anticipatory guidance as below in AVS, screening labs above. Health maintenance items as above in HPI discussed/recommended as applicable.   Encounter for special screening examination for neoplasm of prostate - Plan: PSA, Medicare  -Slightly firm inferior aspect prostate. Prior PSAs reviewed with slight increase but still normal range when last checked. If further increase of PSA, would recommend urology evaluation and repeat exam.  Elevated serum creatinine - Plan: COMPLETE METABOLIC PANEL WITH GFR  -Labs pending. Maintain hydration, avoid nephrotoxins.  Essential hypertension - Plan: COMPLETE METABOLIC PANEL WITH GFR, losartan-hydrochlorothiazide (HYZAAR) 100-12.5 MG tablet  -Borderline. Decided against medication change, but check outside readings, and bring supplements in next visit to make sure these are increase in his pressure. RTC precautions if persistent elevated readings at home.  Hyperlipidemia - Plan: Lipid panel  -Labs pending.  High risk medication use - Plan: COMPLETE METABOLIC PANEL WITH GFR, CBC  -On Humira. CMP and CBC pending.  Screening, anemia, deficiency, iron - Plan: CBC  Asthma, mild intermittent, uncomplicated - Plan: beclomethasone (QVAR) 80 MCG/ACT inhaler  -Overall controlled, including with episodic use of Advair.  Will change to Qvar 80 g twice a day as controller, albuterol as needed for breakthrough symptoms. RTC precautions.  Need for prophylactic vaccination against Streptococcus pneumoniae (pneumococcus) - Plan: Pneumococcal conjugate vaccine 13-valent IM  -Prevnar given.  Need  for shingles vaccine - Plan: Zoster Vaccine Live, PF, (ZOSTAVAX) 09811 UNT/0.65ML injection  Prescription given for Zostavax, may want to check with other provider make sure they're okay with him taking this.  Need for hepatitis C screening test - Plan: Hepatitis C antibody Declined HIV screening test.   Meds ordered this encounter  Medications  . augmented  betamethasone dipropionate (DIPROLENE-AF) 0.05 % ointment    Sig: APPLICATIONS APPLY ON THE SKIN APPLY TWICE DAILY TO LEGS    Refill:  2  . DISCONTD: diazepam (VALIUM) 5 MG tablet    Sig: Take 5 mg by mouth as needed.    Refill:  0  . Adalimumab (HUMIRA) 40 MG/0.8ML PSKT    Sig: Inject 40 mg into the skin every 30 (thirty) days.  . beclomethasone (QVAR) 80 MCG/ACT inhaler    Sig: Inhale 1 puff into the lungs 2 (two) times daily.    Dispense:  1 Inhaler    Refill:  5  . Zoster Vaccine Live, PF, (ZOSTAVAX) 10272 UNT/0.65ML injection    Sig: Inject 19,400 Units into the skin once.    Dispense:  1 each    Refill:  0  . losartan-hydrochlorothiazide (HYZAAR) 100-12.5 MG tablet    Sig: Take 1 tablet by mouth daily.    Dispense:  90 tablet    Refill:  3   Patient Instructions       IF you received an x-ray today, you will receive an invoice from [facility]. Please contact [facility] at [phone] with questions or concerns regarding your invoice.   IF you received labwork today, you will receive an invoice from [employer]. Please contact [facility] at [phone] with questions or concerns regarding your invoice.   Our billing staff will not be able to assist you with questions regarding bills from these companies.  You will be contacted with the lab results as soon as they are available. The fastest way to get your results is to activate your My Chart account. Instructions are located on the last page of this paperwork. If you have not heard from [place] regarding the results in 2  weeks, please contact this office.     Stop Advair, start Qvar for asthma. If any worsening of symptoms- return for recheck.   Call gastroenterologist for colonoscopy scheduling as you are due.   Keep a record of your Howell pressures outside of the office and if remaining over 140/90 in next 2 weeks - return to discuss medication changes. Bring all meds and supplements to that visit.   If cholesterol is elevated, can restart Crestor, but will check labs first.   If prostate test is higher, would recommend evaluation with urologist.   I printed shingles vaccine Rx to have given at your pharmacy if ok with your dermatologist as you are taking Humira. Also check into cost as may not be covered by insurance.    Keeping you healthy  Get these tests  Howell pressure- Have your Howell pressure checked once a year by your healthcare provider.  Normal Howell pressure is 120/80  Weight- Have your body mass index (BMI) calculated to screen for obesity.  BMI is a measure of body fat based on height and weight. You can also calculate your own BMI at [email or web address].[email or web address].  Cholesterol- Have your cholesterol checked every year.  Diabetes- Have your Howell sugar checked regularly if you have high Howell pressure, high cholesterol, have a family history of diabetes or if you are overweight.  Screening for Colon Cancer- Colonoscopy starting at age 71.  Screening may begin sooner depending on your family history and other health conditions. Follow up colonoscopy as directed by your Gastroenterologist.  Screening for Prostate Cancer- Both Howell work (PSA) and a rectal exam help screen for Prostate Cancer.  Screening begins at age 62 with African-American men and at age  66 with Caucasian men.  Screening may begin sooner depending on your family history.  Take these medicines  Aspirin- One aspirin daily can help prevent Heart disease and Stroke.  Flu shot- Every fall.  Tetanus- Every 10  years.  Zostavax- Once after the age of 21 to prevent Shingles.  Pneumonia shot- Once after the age of 52; if you are younger than 54, ask your healthcare provider if you need a Pneumonia shot.  Take these steps  Don't smoke- If you do smoke, talk to your doctor about quitting.  For tips on how to quit, go to www.smokefree.gov or call 1-800-QUIT-NOW.  Be physically active- Exercise 5 days a week for at least 30 minutes.  If you are not already physically active start slow and gradually work up to 30 minutes of moderate physical activity.  Examples of moderate activity include walking briskly, mowing the yard, dancing, swimming, bicycling, etc.  Eat a healthy diet- Eat a variety of healthy food such as fruits, vegetables, low fat milk, low fat cheese, yogurt, lean meant, poultry, fish, beans, tofu, etc. For more information go to www.thenutritionsource.org  Drink alcohol in moderation- Limit alcohol intake to less than two drinks a day. Never drink and drive.  Dentist- Brush and floss twice daily; visit your dentist twice a year.  Depression- Your emotional health is as important as your physical health. If you're feeling down, or losing interest in things you would normally enjoy please talk to your healthcare provider.  Eye exam- Visit your eye doctor every year.  Safe sex- If you may be exposed to a sexually transmitted infection, use a condom.  Seat belts- Seat belts can save your life; always wear one.  Smoke/Carbon Monoxide detectors- These detectors need to be installed on the appropriate level of your home.  Replace batteries at least once a year.  Skin cancer- When out in the sun, cover up and use sunscreen 15 SPF or higher.  Violence- If anyone is threatening you, please tell your healthcare provider.  Living Will/ Health care power of attorney- Speak with your healthcare provider and family.     I personally performed the services described in this documentation, which  was scribed in my presence. The recorded information has been reviewed and considered, and addended by me as needed.   Signed,   [name], MD [facility].  05/02/2016 4:17 PM

## 2016-04-29 NOTE — Patient Instructions (Addendum)
IF you received an x-ray today, you will receive an invoice from [facility]. Please contact [facility] at [phone] with questions or concerns regarding your invoice.   IF you received labwork today, you will receive an invoice from [employer]. Please contact [facility] at [phone] with questions or concerns regarding your invoice.   Our billing staff will not be able to assist you with questions regarding bills from these companies.  You will be contacted with the lab results as soon as they are available. The fastest way to get your results is to activate your My Chart account. Instructions are located on the last page of this paperwork. If you have not heard from [place] regarding the results in 2 weeks, please contact this office.     Stop Advair, start Qvar for asthma. If any worsening of symptoms- return for recheck.   Call gastroenterologist for colonoscopy scheduling as you are due.   Keep a record of your blood pressures outside of the office and if remaining over 140/90 in next 2 weeks - return to discuss medication changes. Bring all meds and supplements to that visit.   If cholesterol is elevated, can restart Crestor, but will check labs first.   If prostate test is higher, would recommend evaluation with urologist.   I printed shingles vaccine Rx to have given at your pharmacy if ok with your dermatologist as you are taking Humira. Also check into cost as may not be covered by insurance.    Keeping you healthy  Get these tests  Blood pressure- Have your blood pressure checked once a year by your healthcare provider.  Normal blood pressure is 120/80  Weight- Have your body mass index (BMI) calculated to screen for obesity.  BMI is a measure of body fat based on height and weight. You can also calculate your own BMI at [email or web address].  Cholesterol- Have your cholesterol checked every year.  Diabetes- Have  your blood sugar checked regularly if you have high blood pressure, high cholesterol, have a family history of diabetes or if you are overweight.  Screening for Colon Cancer- Colonoscopy starting at age 11.  Screening may begin sooner depending on your family history and other health conditions. Follow up colonoscopy as directed by your Gastroenterologist.  Screening for Prostate Cancer- Both blood work (PSA) and a rectal exam help screen for Prostate Cancer.  Screening begins at age 46 with African-American men and at age 52 with Caucasian men.  Screening may begin sooner depending on your family history.  Take these medicines  Aspirin- One aspirin daily can help prevent Heart disease and Stroke.  Flu shot- Every fall.  Tetanus- Every 10 years.  Zostavax- Once after the age of 78 to prevent Shingles.  Pneumonia shot- Once after the age of 72; if you are younger than 83, ask your healthcare provider if you need a Pneumonia shot.  Take these steps  Don't smoke- If you do smoke, talk to your doctor about quitting.  For tips on how to quit, go to www.smokefree.gov or call 1-800-QUIT-NOW.  Be physically active- Exercise 5 days a week for at least 30 minutes.  If you are not already physically active start slow and gradually work up to 30 minutes of moderate physical activity.  Examples of moderate activity include walking briskly, mowing the yard, dancing, swimming, bicycling, etc.  Eat a healthy diet- Eat a variety of healthy food such as fruits, vegetables, low fat milk, low fat cheese,  yogurt, lean meant, poultry, fish, beans, tofu, etc. For more information go to www.thenutritionsource.org  Drink alcohol in moderation- Limit alcohol intake to less than two drinks a day. Never drink and drive.  Dentist- Brush and floss twice daily; visit your dentist twice a year.  Depression- Your emotional health is as important as your physical health. If you're feeling down, or losing interest in  things you would normally enjoy please talk to your healthcare provider.  Eye exam- Visit your eye doctor every year.  Safe sex- If you may be exposed to a sexually transmitted infection, use a condom.  Seat belts- Seat belts can save your life; always wear one.  Smoke/Carbon Monoxide detectors- These detectors need to be installed on the appropriate level of your home.  Replace batteries at least once a year.  Skin cancer- When out in the sun, cover up and use sunscreen 15 SPF or higher.  Violence- If anyone is threatening you, please tell your healthcare provider.  Living Will/ Health care power of attorney- Speak with your healthcare provider and family.

## 2016-04-30 LAB — CBC
HCT: 47.8 % (ref 38.5–50.0)
Hemoglobin: 16.3 g/dL (ref 13.2–17.1)
MCH: 29.1 pg (ref 27.0–33.0)
MCHC: 34.1 g/dL (ref 32.0–36.0)
MCV: 85.4 fL (ref 80.0–100.0)
MPV: 11.7 fL (ref 7.5–12.5)
PLATELETS: 176 10*3/uL (ref 140–400)
RBC: 5.6 MIL/uL (ref 4.20–5.80)
RDW: 14.1 % (ref 11.0–15.0)
WBC: 5.5 10*3/uL (ref 3.8–10.8)

## 2016-04-30 LAB — COMPLETE METABOLIC PANEL WITH GFR
ALT: 13 U/L (ref 9–46)
AST: 17 U/L (ref 10–35)
Albumin: 4 g/dL (ref 3.6–5.1)
Alkaline Phosphatase: 61 U/L (ref 40–115)
BILIRUBIN TOTAL: 1.4 mg/dL — AB (ref 0.2–1.2)
BUN: 12 mg/dL (ref 7–25)
CHLORIDE: 99 mmol/L (ref 98–110)
CO2: 28 mmol/L (ref 20–31)
CREATININE: 1.59 mg/dL — AB (ref 0.70–1.25)
Calcium: 9.2 mg/dL (ref 8.6–10.3)
GFR, EST AFRICAN AMERICAN: 52 mL/min — AB (ref >=60)
GFR, Est Non African American: 45 mL/min — ABNORMAL LOW (ref >=60)
GLUCOSE: 93 mg/dL (ref 65–99)
Potassium: 4 mmol/L (ref 3.5–5.3)
Sodium: 139 mmol/L (ref 135–146)
Total Protein: 7.5 g/dL (ref 6.1–8.1)

## 2016-04-30 LAB — LIPID PANEL
CHOL/HDL RATIO: 3.6 ratio (ref <=5.0)
Cholesterol: 204 mg/dL — ABNORMAL HIGH (ref 125–200)
HDL: 57 mg/dL (ref >=40)
LDL CALC: 130 mg/dL — AB (ref <130)
Triglycerides: 87 mg/dL (ref <150)
VLDL: 17 mg/dL (ref <30)

## 2016-04-30 LAB — PSA, MEDICARE: PSA: 2.4 ng/mL (ref <=4.00)

## 2016-05-04 ENCOUNTER — Telehealth: Payer: Self-pay

## 2016-05-04 MED ORDER — FLUTICASONE PROPIONATE HFA 44 MCG/ACT IN AERO: 1.0000 | INHALATION_SPRAY | Freq: Two times a day (BID) | RESPIRATORY_TRACT | Status: DC

## 2016-05-04 NOTE — Telephone Encounter (Signed)
QVAR is not on pt's formulary and pharm reqs a change to preferred alternative if possible. I checked formulary and either Flovent Diskus or HFA, or Pulmicort Flexhaler should be covered.

## 2016-05-04 NOTE — Telephone Encounter (Signed)
flovent 41mcg inh - 2 puffs bid. rx sent.

## 2016-05-18 ENCOUNTER — Encounter: Payer: Self-pay | Admitting: Internal Medicine

## 2016-05-24 ENCOUNTER — Ambulatory Visit (INDEPENDENT_AMBULATORY_CARE_PROVIDER_SITE_OTHER): Payer: Medicare Other | Admitting: Physician Assistant

## 2016-05-24 VITALS — BP 132/84 | HR 65 | Temp 97.9°F | Resp 18 | Ht 67.0 in | Wt 166.8 lb

## 2016-05-24 DIAGNOSIS — H00026 Hordeolum internum left eye, unspecified eyelid: Secondary | ICD-10-CM

## 2016-05-24 MED ORDER — POLYMYXIN B-TRIMETHOPRIM 10000-0.1 UNIT/ML-% OP SOLN: 1.0000 [drp] | OPHTHALMIC | Status: DC

## 2016-05-24 MED ORDER — DOXYCYCLINE HYCLATE 100 MG PO CAPS: 100.0000 mg | ORAL_CAPSULE | Freq: Two times a day (BID) | ORAL | Status: AC

## 2016-05-24 NOTE — Progress Notes (Signed)
   05/24/2016 12:21 PM   DOB: 1951/03/28 / MRN: [id number]  SUBJECTIVE:  Frank Howell is a 64 y.o. male presenting for a swollen and painful left superiolateral eye lid.  Has a history of stye. Associates mild swelling of the lid.  Has been using warm compress and feels that he is getting somewhat better.  Denies vision changes, nausea, contact lens use, HA, and photophobia.    He is allergic to penicillins.   He  has a past medical history of Abnormal liver function; Dermatitis; Hypertension; Psoriasis; Allergy; Hyperlipidemia; Asthma; and GERD (gastroesophageal reflux disease).    He  reports that he has never smoked. He has never used smokeless tobacco. He reports that he drinks alcohol. He reports that he does not use illicit drugs. He  has no sexual activity history on file. The patient  has no past surgical history on file.  His family history includes Asthma in his mother; Hypertension in his brother and sister.  ROS  Per HPI.   Problem list and medications reviewed and updated by myself where necessary, and exist elsewhere in the encounter.   OBJECTIVE:  BP 132/84 mmHg  Pulse 65  Temp(Src) 97.9 F (36.6 C) (Oral)  Resp 18  Ht 5\' 7"  (1.702 m)  Wt 166 lb 12.8 oz (75.66 kg)  BMI 26.12 kg/m2  SpO2 99%  Physical Exam  Constitutional: He is oriented to person, place, and time.  Eyes:    Cardiovascular: Normal rate and regular rhythm.   Pulmonary/Chest: Effort normal and breath sounds normal.  Neurological: He is alert and oriented to person, place, and time.  Skin: Skin is warm and dry.    No results found for this or any previous visit (from the past 72 hour(s)).  No results found.   Visual Acuity Screening   Right eye Left eye Both eyes  Without correction: 20/30 20/30 -1 20/25 -2  With correction:        ASSESSMENT AND PLAN  Frank Howell was seen today for eye problem.  Diagnoses and all orders for this visit:  Hordeolum internum of left eye: No alarm symptoms.  Polytrim for now and warm compress.  Doxy if not improved in 48-72 hours.   -     trimethoprim-polymyxin b (POLYTRIM) ophthalmic solution; Place 1 drop into the left eye every 4 (four) hours. -     doxycycline (VIBRAMYCIN) 100 MG capsule; Take 1 capsule (100 mg total) by mouth 2 (two) times daily.    The patient was advised to call or return to clinic if he does not see an improvement in symptoms or to seek the care of the closest emergency department if he worsens with the above plan.   Philis Fendt, MHS, PA-C [facility] 05/24/2016 12:21 PM

## 2016-05-24 NOTE — Patient Instructions (Signed)
     IF you received an x-ray today, you will receive an invoice from [facility]. Please contact [facility] at [phone] with questions or concerns regarding your invoice.   IF you received labwork today, you will receive an invoice from [facility]. Please contact [facility] at [phone] with questions or concerns regarding your invoice.   Our billing staff will not be able to assist you with questions regarding bills from these companies.  You will be contacted with the lab results as soon as they are available. The fastest way to get your results is to activate your My Chart account. Instructions are located on the last page of this paperwork. If you have not heard from us regarding the results in 2 weeks, please contact this office.      

## 2016-07-23 ENCOUNTER — Encounter: Payer: Medicare Other | Admitting: Internal Medicine

## 2016-09-02 ENCOUNTER — Telehealth: Payer: Self-pay | Admitting: Family Medicine

## 2016-09-02 MED ORDER — DOXYCYCLINE HYCLATE 100 MG PO CAPS: 100.0000 mg | ORAL_CAPSULE | Freq: Every day | ORAL | 0 refills | Status: DC

## 2016-09-02 NOTE — Telephone Encounter (Signed)
Pt came into our office wanting a RX for Doxy he is going back out of the country to [place] and like for it to be sent to [facility] on [address] please call patient when ready

## 2016-09-02 NOTE — Telephone Encounter (Signed)
Pt stated that he is going to [place] and [place] the city, [place] areas. He leaves Oct 22 and returns Nov 17. He is requesting this for malaria prevention and states that he has everything else he needs already for traveling to [place].

## 2016-09-02 NOTE — Telephone Encounter (Signed)
Meds ordered this encounter  Medications  . doxycycline (VIBRAMYCIN) 100 MG capsule    Sig: Take 1 capsule (100 mg total) by mouth daily. Start 1-2 days prior to travel and stop 4 weeks after return.    Dispense:  60 capsule    Refill:  0    Order Specific Question:   Supervising Provider    Answer:   Brigitte Pulse, EVA N [[id number]]

## 2016-09-02 NOTE — Telephone Encounter (Signed)
I need more information about this medication request.  Where is he going?  Will he [place] in rural areas? When does he leave? When will he return? Is this for malaria prevention? If not, what is it for?

## 2016-09-09 NOTE — Telephone Encounter (Signed)
Pt called back and stated that doxy was not what he had wanted. After discussion, he went to his pharm to see what he had been Rxd in the past, and then called me to advise the name of the medication he got before was atovaquorne-proguanil 250-100 mg.

## 2016-09-10 MED ORDER — ATOVAQUONE-PROGUANIL HCL 250-100 MG PO TABS: 1.0000 | ORAL_TABLET | Freq: Every day | ORAL | 0 refills | Status: DC

## 2016-09-10 NOTE — Telephone Encounter (Signed)
Meds ordered this encounter  Medications  . DISCONTD: doxycycline (VIBRAMYCIN) 100 MG capsule    Sig: Take 1 capsule (100 mg total) by mouth daily. Start 1-2 days prior to travel and stop 4 weeks after return.    Dispense:  60 capsule    Refill:  0    Order Specific Question:   Supervising Provider    Answer:   SHAW, EVA N [[id number]]  . atovaquone-proguanil (MALARONE) 250-100 MG TABS tablet    Sig: Take 1 tablet by mouth daily. Start 1-2 days prior to travel and stop 7 days after return    Dispense:  40 tablet    Refill:  0    Order Specific Question:   Supervising Provider    Answer:   Brigitte Pulse, EVA N [[id number]]

## 2016-09-10 NOTE — Telephone Encounter (Signed)
Called patient and left message stating the medication had been called in for him.

## 2016-09-30 ENCOUNTER — Ambulatory Visit (INDEPENDENT_AMBULATORY_CARE_PROVIDER_SITE_OTHER): Payer: Medicare Other | Admitting: Emergency Medicine

## 2016-09-30 VITALS — BP 152/82 | HR 74 | Temp 97.9°F | Resp 17 | Ht 67.0 in | Wt 169.0 lb

## 2016-09-30 DIAGNOSIS — L409 Psoriasis, unspecified: Secondary | ICD-10-CM | POA: Diagnosis not present

## 2016-09-30 DIAGNOSIS — R350 Frequency of micturition: Secondary | ICD-10-CM | POA: Diagnosis not present

## 2016-09-30 DIAGNOSIS — R21 Rash and other nonspecific skin eruption: Secondary | ICD-10-CM | POA: Diagnosis not present

## 2016-09-30 DIAGNOSIS — T887XXS Unspecified adverse effect of drug or medicament, sequela: Secondary | ICD-10-CM

## 2016-09-30 DIAGNOSIS — R739 Hyperglycemia, unspecified: Secondary | ICD-10-CM | POA: Diagnosis not present

## 2016-09-30 DIAGNOSIS — I10 Essential (primary) hypertension: Secondary | ICD-10-CM | POA: Diagnosis not present

## 2016-09-30 DIAGNOSIS — T50905S Adverse effect of unspecified drugs, medicaments and biological substances, sequela: Secondary | ICD-10-CM

## 2016-09-30 LAB — COMPREHENSIVE METABOLIC PANEL
ALK PHOS: 60 U/L (ref 40–115)
ALT: 15 U/L (ref 9–46)
AST: 14 U/L (ref 10–35)
Albumin: 3.6 g/dL (ref 3.6–5.1)
BUN: 10 mg/dL (ref 7–25)
CHLORIDE: 105 mmol/L (ref 98–110)
CO2: 26 mmol/L (ref 20–31)
CREATININE: 1.45 mg/dL — AB (ref 0.70–1.25)
Calcium: 8.8 mg/dL (ref 8.6–10.3)
GLUCOSE: 140 mg/dL — AB (ref 65–99)
Potassium: 3.7 mmol/L (ref 3.5–5.3)
SODIUM: 139 mmol/L (ref 135–146)
TOTAL PROTEIN: 7 g/dL (ref 6.1–8.1)
Total Bilirubin: 0.9 mg/dL (ref 0.2–1.2)

## 2016-09-30 LAB — POCT CBC
Granulocyte percent: 80.1 %G — AB (ref 37–80)
HCT, POC: 44 % (ref 43.5–53.7)
HEMOGLOBIN: 15.7 g/dL (ref 14.1–18.1)
Lymph, poc: 1.8 (ref 0.6–3.4)
MCH, POC: 30.1 pg (ref 27–31.2)
MCHC: 35.6 g/dL — AB (ref 31.8–35.4)
MCV: 84.5 fL (ref 80–97)
MID (cbc): 0.4 (ref 0–0.9)
MPV: 8.8 fL (ref 0–99.8)
PLATELET COUNT, POC: 126 10*3/uL — AB (ref 142–424)
POC Granulocyte: 8.8 — AB (ref 2–6.9)
POC LYMPH %: 16.3 % (ref 10–50)
POC MID %: 3.6 %M (ref 0–12)
RBC: 5.21 M/uL (ref 4.69–6.13)
RDW, POC: 14.2 %
WBC: 11 10*3/uL — AB (ref 4.6–10.2)

## 2016-09-30 LAB — POC MICROSCOPIC URINALYSIS (UMFC): MUCUS RE: ABSENT

## 2016-09-30 LAB — POCT URINALYSIS DIP (MANUAL ENTRY)
BILIRUBIN UA: NEGATIVE
Bilirubin, UA: NEGATIVE
Blood, UA: NEGATIVE
Glucose, UA: NEGATIVE
Leukocytes, UA: NEGATIVE
Nitrite, UA: NEGATIVE
PROTEIN UA: NEGATIVE
SPEC GRAV UA: 1.015
Urobilinogen, UA: 0.2
pH, UA: 7

## 2016-09-30 LAB — GLUCOSE, POCT (MANUAL RESULT ENTRY): POC GLUCOSE: 143 mg/dL — AB (ref 70–99)

## 2016-09-30 MED ORDER — TRIAMCINOLONE 0.1 % CREAM:EUCERIN CREAM 1:1: 1.0000 "application " | TOPICAL_CREAM | Freq: Three times a day (TID) | CUTANEOUS | 1 refills | Status: DC

## 2016-09-30 MED ORDER — ATENOLOL 50 MG PO TABS: 50.0000 mg | ORAL_TABLET | Freq: Every day | ORAL | 3 refills | Status: DC

## 2016-09-30 NOTE — Patient Instructions (Addendum)
Please apply your cream 3 times a day. Take your blood pressure medicine every day. Please return to clinic immediately if you become ill or develops fever. Do not take your Humira injections. Return to clinic in 2 weeks for blood sugar check and pressure check.    IF you received an x-ray today, you will receive an invoice from [facility]. Please contact [facility] at [phone] with questions or concerns regarding your invoice.   IF you received labwork today, you will receive an invoice from [employer]. Please contact [facility] at [phone] with questions or concerns regarding your invoice.   Our billing staff will not be able to assist you with questions regarding bills from these companies.  You will be contacted with the lab results as soon as they are available. The fastest way to get your results is to activate your My Chart account. Instructions are located on the last page of this paperwork. If you have not heard from [place] regarding the results in 2 weeks, please contact this office.

## 2016-09-30 NOTE — Progress Notes (Addendum)
Patient ID: Frank Howell, male   DOB: Sep 08, 1951, 65 y.o.   MRN: [id number]    By signing my name below, I, [name], attest that this documentation has been prepared under the direction and in the presence of Darlyne Russian, MD Electronically Signed: Ladene Artist, ED Scribe 09/30/2016 at 9:42 AM.  Chief Complaint:  Chief Complaint  Patient presents with  . Hand Burn    Peeling and red onset 8 days / thinks due to Malaria medication/ and feet   HPI: Frank Howell is a 65 y.o. male, with a h/o psoriasis, who reports to [facility] today complaining of a medication reaction. Pt started Malarone on 09/10/16 prior to traveling to [place] on 09/12/16 to visit family. He noticed a tingling sensation in his hands while on the plane the day after starting the medication. Pt later noticed a burning sensation to both hands, swelling, redness, peeling and itching. Approximately 5 days after starting the medication, he noticed swelling, sores and peeling to the soles of both feet and his left leg. He also reports some urinary frequency and polydipsia. Pt states that he took approximately 9 dose of the medication because he was unsure that he was reacting to it; his last dose was approximately 09/23/16. Pt denies fever, chills, sores around his eyes, mouth or rectal area, and abdominal pain. He received a hydrocortisone injection and a steroid cream while in [place] which he states improved itching. Pt states that he had a similar experience last year but he thought he was having an allergic reaction to cats.   Past Medical History:  Diagnosis Date  . Abnormal liver function   . Allergy   . Asthma    per patient has not had asthma attack in five years  . Dermatitis   . GERD (gastroesophageal reflux disease)    patient stated GERD is no longer a problem for him  . Hyperlipidemia   . Hypertension   . Psoriasis    No past surgical history on file. Social History   Social History  . Marital status: Married    Spouse name: N/A  . Number of children: N/A  . Years of education: N/A   Social History Main Topics  . Smoking status: Never Smoker  . Smokeless tobacco: Never Used  . Alcohol use 0.0 oz/week     Comment: occasionally  . Drug use: No  . Sexual activity: Not Asked   Other Topics Concern  . 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 Asthma Mother   . Hypertension Sister   . Hypertension Brother    Allergies  Allergen Reactions  . Penicillins     REACTION: skin irritation   Prior to Admission medications   Medication Sig Start Date End Date Taking? Authorizing Provider  Adalimumab (HUMIRA) 40 MG/0.8ML PSKT Inject 40 mg into the skin every 30 (thirty) days.   Yes Historical Provider, MD  albuterol (PROVENTIL HFA;VENTOLIN HFA) 108 (90 BASE) MCG/ACT inhaler Inhale 2 puffs into the lungs every 6 (six) hours as needed for wheezing. 08/12/15  Yes Orma Flaming, MD  aspirin EC 81 MG tablet Take 81 mg by mouth every 4 (four) hours as needed for mild pain or moderate pain. Reported on 05/24/2016   Yes Historical  Provider, MD  atovaquone-proguanil (MALARONE) 250-100 MG TABS tablet Take 1 tablet by mouth daily. Start 1-2 days prior to travel and stop 7 days after return 09/10/16  Yes Chelle Jeffery, PA-C  beclomethasone (QVAR) 80 MCG/ACT inhaler Inhale 1 puff into the lungs 2 (two) times daily. 04/29/16  Yes [name], MD  fluticasone (FLOVENT HFA) 44 MCG/ACT inhaler Inhale 1 puff into the lungs 2 (two) times daily. 05/04/16  Yes [name], MD  losartan-hydrochlorothiazide (HYZAAR) 100-12.5 MG tablet Take 1 tablet by mouth daily. 04/29/16  Yes [name], MD  augmented betamethasone dipropionate (DIPROLENE-AF) [id number] % ointment APPLICATIONS APPLY ON THE SKIN APPLY TWICE DAILY TO LEGS 03/12/16    Historical Provider, MD  Zoster Vaccine Live, PF, (ZOSTAVAX) 16109 UNT/0.65ML injection Inject 19,400 Units into the skin once. Patient not taking: Reported on 09/30/2016 04/29/16   [name], MD    ROS: The patient denies fevers, chills, night sweats, unintentional weight loss, chest pain, palpitations, wheezing, dyspnea on exertion, nausea, vomiting, abdominal pain, dysuria, hematuria, melena, numbness, weakness, or tingling. +color change, +wounds, +urinary frequency, +polydipsia  All other systems have been reviewed and were otherwise negative with the exception of those mentioned in the HPI and as above.    PHYSICAL EXAM: Vitals:   09/30/16 0925  BP: (!) 152/82  Pulse: 74  Resp: 17  Temp: 97.9 F (36.6 C)   Body mass index is 26.47 kg/m.  General: Alert, no acute distress HEENT:  Normocephalic, atraumatic, oropharynx patent. Eye: Juliette Mangle [facility] Cardiovascular:  Regular rate and rhythm, no rubs murmurs or gallops.  No Carotid bruits, radial pulse intact. No pedal edema.  Respiratory: Clear to auscultation bilaterally.  No wheezes, rales, or rhonchi.  No cyanosis, no use of accessory musculature Abdominal: No organomegaly, abdomen is soft and non-tender, positive bowel sounds.  No masses. Musculoskeletal: Gait intact. 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 Skin has peeled from entire palmar surface from both hands. Thickened callus which is trying to peel pm plantar surface of both feet.  Neurologic: Facial musculature symmetric. Psychiatric: Patient acts appropriately throughout our interaction. Lymphatic: No cervical or submandibular lymphadenopathy   LABS: Results for orders placed or performed in visit on 09/30/16  POCT CBC  Result Value Ref Range   WBC 11.0 (A) 4.6 - 10.2 K/uL   Lymph, poc 1.8 0.6 - 3.4   POC LYMPH PERCENT 16.3 10 - 50 %L   MID (cbc) 0.4 0 - 0.9   POC MID % 3.6 0 - 12 %M   POC Granulocyte 8.8 (A) 2 - 6.9     Granulocyte percent 80.1 (A) 37 - 80 %G   RBC 5.21 4.69 - 6.13 M/uL   Hemoglobin 15.7 14.1 - 18.1 g/dL   HCT, POC 44.0 43.5 - 53.7 %   MCV 84.5 80 - 97 fL   MCH, POC 30.1 27 - 31.2 pg   MCHC 35.6 (A) 31.8 - 35.4 g/dL   RDW, POC 14.2 %   Platelet Count, POC 126 (A) 142 - 424 K/uL   MPV 8.8 0 - 99.8 fL  POCT glucose (manual entry)  Result Value Ref Range   POC Glucose 143 (A) 70 - 99 mg/dl  POCT urinalysis dipstick  Result Value Ref Range   Color, UA yellow yellow   Clarity, UA clear clear   Glucose, UA negative negative   Bilirubin, UA negative negative   Ketones, POC UA negative negative   Spec Grav,  UA 1.015    Blood, UA negative negative   pH, UA 7.0    Protein Ur, POC negative negative   Urobilinogen, UA 0.2    Nitrite, UA Negative Negative   Leukocytes, UA Negative Negative  POCT Microscopic Urinalysis ([facility])  Result Value Ref Range   WBC,UR,HPF,POC None None WBC/hpf   RBC,UR,HPF,POC None None RBC/hpf   Bacteria None None, Too numerous to count   Mucus Absent Absent   Epithelial Cells, UR Per Microscopy None None, Too numerous to count cells/hpf   EKG/XRAY:   Primary read interpreted by Dr. Everlene Farrier at [facility].  ASSESSMENT/PLAN: Sugar was elevated at 143. While in [place] he was started on atenolol 50 mg a day to take along with his losartan. We will continue this for now. I suspect his sugars have because of the steroids he received. We'll hold off on diabetes treatment at present. He will be treated with a triamcinolone Eucerin mix. I advised him not to take his Humira until he has recovered with the desquamation he has suffered on his hands and feet. 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 He is due to see the dermatologist this afternoon.I personally performed the services described in this documentation, which was scribed in my presence. The  recorded information has been reviewed and is accurate.    Gross sideeffects, risk and benefits, and alternatives of medications d/w patient. Patient is aware that all medications have potential sideeffects and we are unable to predict every sideeffect or drug-drug interaction that may occur.  Arlyss Queen MD 09/30/2016 9:41 AM

## 2016-10-01 ENCOUNTER — Telehealth: Payer: Self-pay | Admitting: Emergency Medicine

## 2016-10-01 NOTE — Telephone Encounter (Signed)
-----   Message from Darlyne Russian, MD sent at 10/01/2016  9:53 AM EST ----- Glucose is slightly elevated kidney function is diminished but stable. Recheck in a couple of weeks to recheck on his sugar and make sure it returns to normal.

## 2016-10-04 ENCOUNTER — Ambulatory Visit (INDEPENDENT_AMBULATORY_CARE_PROVIDER_SITE_OTHER): Payer: Medicare Other | Admitting: Family Medicine

## 2016-10-04 VITALS — BP 130/86 | HR 100 | Temp 98.2°F | Resp 16 | Ht 67.0 in | Wt 166.2 lb

## 2016-10-04 DIAGNOSIS — R05 Cough: Secondary | ICD-10-CM | POA: Diagnosis not present

## 2016-10-04 DIAGNOSIS — T50905D Adverse effect of unspecified drugs, medicaments and biological substances, subsequent encounter: Secondary | ICD-10-CM

## 2016-10-04 DIAGNOSIS — T887XXD Unspecified adverse effect of drug or medicament, subsequent encounter: Secondary | ICD-10-CM

## 2016-10-04 DIAGNOSIS — R059 Cough, unspecified: Secondary | ICD-10-CM

## 2016-10-04 DIAGNOSIS — R21 Rash and other nonspecific skin eruption: Secondary | ICD-10-CM

## 2016-10-04 DIAGNOSIS — R6883 Chills (without fever): Secondary | ICD-10-CM

## 2016-10-04 LAB — POCT CBC
Granulocyte percent: 79.7 %G (ref 37–80)
HCT, POC: 44.3 % (ref 43.5–53.7)
HEMOGLOBIN: 15.8 g/dL (ref 14.1–18.1)
LYMPH, POC: 2.1 (ref 0.6–3.4)
MCH, POC: 29.9 pg (ref 27–31.2)
MCHC: 35.7 g/dL — AB (ref 31.8–35.4)
MCV: 83.6 fL (ref 80–97)
MID (cbc): 0.5 (ref 0–0.9)
MPV: 8.4 fL (ref 0–99.8)
PLATELET COUNT, POC: 155 10*3/uL (ref 142–424)
POC Granulocyte: 10.4 — AB (ref 2–6.9)
POC LYMPH PERCENT: 16.1 %L (ref 10–50)
POC MID %: 4.2 % (ref 0–12)
RBC: 5.3 M/uL (ref 4.69–6.13)
RDW, POC: 14.1 %
WBC: 13 10*3/uL — AB (ref 4.6–10.2)

## 2016-10-04 MED ORDER — AZITHROMYCIN 250 MG PO TABS: ORAL_TABLET | ORAL | 0 refills | Status: DC

## 2016-10-04 NOTE — Progress Notes (Addendum)
Subjective:  By signing my name below, I, Frank Howell, attest that this documentation has been prepared under the direction and in the presence of Frank Ray, MD. Electronically Signed: [name], [place]. 10/04/2016 , 10:28 AM .  Patient was seen in Room 10 .   Patient ID: Frank Howell, male    DOB: 23-Jun-1951, 65 y.o.   MRN: [id number] Chief Complaint  Patient presents with  . URI    cough, sneezing , chills x week    HPI Frank Howell is a 65 y.o. male H/o asthma, psoriasis, HTN and HLD.   URI symptoms Patient has been having URI symptoms started about 12 days ago. He states his symptoms started with sneezing and cough while he was still in [place]. But, when he returned, his symptoms worsened with more phlegm and chills. He informs mucus discoloration start about 6 days ago. He denies any measured fever. He denies taking any medication for this yet. He denies seasonal allergies.   Rash Patient states he was seen 4 days ago by Dr. Everlene Farrier, for medication reaction. He was taking malarone on Oct 20th prior to traveling to [place]. He notes burning sensation to his hands and peeling skin in both hands, feet and his legs. His last dose was on Nov 2nd. He did receive a steroid injection while in [place], and advised to hold his humira. It was thought he may have had Stevens-Johnson syndrome, but recovered that day. He's been soaking the affected area in [facility], and applying steroid cream. He also saw his dermatologist, Dr. Mancel Parsons, that same day, and gave him ointment cream. He notes his rash is still about the same. He had similar rash when he returned from [place] last year, but was thought due to cats at that time.   Patient Active Problem List   Diagnosis Date Noted  . Pain in the chest   . Chest pain 08/08/2015  . Psoriasis 08/08/2015  . Asthma 01/10/2012  . Hypertension 01/10/2012  . Hyperlipidemia 01/10/2012  . GERD 01/20/2010  . FECAL OCCULT Howell 01/20/2010    Past Medical History:  Diagnosis Date  . Abnormal liver function   . Allergy   . Asthma    per patient has not had asthma attack in five years  . Dermatitis   . GERD (gastroesophageal reflux disease)    patient stated GERD is no longer a problem for him  . Hyperlipidemia   . Hypertension   . Psoriasis    History reviewed. No pertinent surgical history. Allergies  Allergen Reactions  . Malarone [Atovaquone-Proguanil Hcl] Rash  . Penicillins    Prior to Admission medications   Medication Sig Start Date End Date Taking? Authorizing Provider  Adalimumab (HUMIRA) 40 MG/0.8ML PSKT Inject 40 mg into the skin every 30 (thirty) days.   Yes Historical Provider, MD  albuterol (PROVENTIL HFA;VENTOLIN HFA) 108 (90 BASE) MCG/ACT inhaler Inhale 2 puffs into the lungs every 6 (six) hours as needed for wheezing. 08/12/15  Yes Orma Flaming, MD  aspirin EC 81 MG tablet Take 81 mg by mouth every 4 (four) hours as needed for mild pain or moderate pain. Reported on 05/24/2016   Yes Historical Provider, MD  atenolol (TENORMIN) 50 MG tablet Take 1 tablet (50 mg total) by mouth daily. 09/30/16  Yes Darlyne Russian, MD  atovaquone-proguanil (MALARONE) 250-100 MG TABS tablet Take 1 tablet by mouth daily. Start 1-2 days prior to travel and stop 7 days after return 09/10/16  Yes Chelle Jeffery, PA-C  augmented betamethasone dipropionate (DIPROLENE-AF) [id number] % ointment APPLICATIONS APPLY ON THE SKIN APPLY TWICE DAILY TO LEGS 03/12/16  Yes Historical Provider, MD  beclomethasone (QVAR) 80 MCG/ACT inhaler Inhale 1 puff into the lungs 2 (two) times daily. 04/29/16  Yes [name], MD  fluticasone (FLOVENT HFA) 44 MCG/ACT inhaler Inhale 1 puff into the lungs 2 (two) times daily. 05/04/16  Yes [name], MD  losartan-hydrochlorothiazide (HYZAAR) 100-12.5 MG tablet Take 1 tablet by mouth daily. 04/29/16  Yes [name], MD  Triamcinolone Acetonide (TRIAMCINOLONE 0.1 % CREAM : EUCERIN) CREA Apply 1 application  topically 3 (three) times daily. Apply twice a day to affected area. Mix is 240 g of triamcinolone with 240 g of Eucerin 09/30/16  Yes Darlyne Russian, MD  Zoster Vaccine Live, PF, (ZOSTAVAX) 96295 UNT/0.65ML injection Inject 19,400 Units into the skin once. 04/29/16  Yes [name], MD   Social History   Social History  . Marital status: Married    Spouse name: N/A  . Number of children: N/A  . Years of education: N/A   Occupational History  . Not on file.   Social History Main Topics  . Smoking status: Never Smoker  . Smokeless tobacco: Never Used  . Alcohol use 0.0 oz/week     Comment: occasionally  . Drug use: No  . Sexual activity: Not on file   Other Topics Concern  . 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 Negative for fatigue and unexpected weight change.  HENT: Positive for congestion and sneezing.   Eyes: Negative for visual disturbance.  Respiratory: Positive for cough. Negative for chest tightness and shortness of breath.   Cardiovascular: Negative for chest pain, palpitations and leg swelling.  Gastrointestinal: Negative for abdominal pain and Howell in stool.  Skin: Positive for rash.  Allergic/Immunologic: Negative for environmental allergies.  Neurological: Negative for dizziness, light-headedness and headaches.       Objective:   Physical Exam  Constitutional: He is oriented to person, place, and time. He appears well-developed and well-nourished. No distress.  HENT:  Head: Normocephalic and atraumatic.  Mouth/Throat: No oral lesions.  Eyes: EOM are normal. Pupils are equal, round, and reactive to light.  Neck: Neck supple. No JVD present. Carotid bruit is not present.  Cardiovascular: Normal rate, regular rhythm and normal heart sounds.   No murmur  heard. Pulmonary/Chest: Effort normal and breath sounds normal. No respiratory distress. He has no decreased breath sounds. He has no wheezes. He has no rales.  Musculoskeletal: Normal range of motion. He exhibits no edema.  Neurological: He is alert and oriented to person, place, and time.  Skin: Skin is warm and dry.  Dead skin with peeling of his lower legs bilaterally including throughout toes; blistering and peeling skin from his hands on the palmar aspect of hands also with peeled skin, no acute blisters seen  Psychiatric: He has a normal mood and affect. His behavior is normal.  Nursing note and vitals reviewed.   Vitals:   10/04/16 0947  BP: 130/86  Pulse: 100  Resp: 16  Temp: 98.2 F (36.8 C)  TempSrc: Oral  SpO2: 99%  Weight: 166 lb 3.2 oz (75.4 kg)  Height: 5\' 7"  (  1.702 m)   Results for orders placed or performed in visit on 10/04/16  POCT CBC  Result Value Ref Range   WBC 13.0 (A) 4.6 - 10.2 K/uL   Lymph, poc 2.1 0.6 - 3.4   POC LYMPH PERCENT 16.1 10 - 50 %L   MID (cbc) 0.5 0 - 0.9   POC MID % 4.2 0 - 12 %M   POC Granulocyte 10.4 (A) 2 - 6.9   Granulocyte percent 79.7 37 - 80 %G   RBC 5.30 4.69 - 6.13 M/uL   Hemoglobin 15.8 14.1 - 18.1 g/dL   HCT, POC 44.3 43.5 - 53.7 %   MCV 83.6 80 - 97 fL   MCH, POC 29.9 27 - 31.2 pg   MCHC 35.7 (A) 31.8 - 35.4 g/dL   RDW, POC 14.1 %   Platelet Count, POC 155 142 - 424 K/uL   MPV 8.4 0 - 99.8 fL        Assessment & Plan:    Frank Howell is a 65 y.o. male Cough - Plan: POCT CBC Chills - Plan: POCT CBC   -  Possible early community-acquired pneumonia. Start azithromycin, symptomatic care, RTC precautions.  Adverse effect of drug, subsequent encounter Rash and nonspecific skin eruption  -Desquamation/rash on extremities concerning for possible initial  Stevens-Johnson, but now stable. Has been seen by dermatology, placed on ointment, no new lesions. Advised to call dermatologist to determine follow-up plan, RTC/ER  precautions if any new rash or worsening symptoms.  Meds ordered this encounter  Medications  . azithromycin (ZITHROMAX) 250 MG tablet    Sig: Take 2 pills by mouth on day 1, then 1 pill by mouth per day on days 2 through 5.    Dispense:  6 tablet    Refill:  0   Patient Instructions    Call your dermatologist for office visit if rash is not improving with cream provided. Return to the clinic or go to the nearest emergency room if any of your symptoms worsen or new symptoms occur.  Your infection fighting cells were elevated on Howell work today, so with your increased cough, possible early pneumonia. Start azithromycin 2 pills today, then 1 pill for the next 4 days. Follow-up in the next 1 week if not improving, sooner if worse, or if you have any fevers in the next few days.   Cough, Adult Coughing is a reflex that clears your throat and your airways. Coughing helps to heal and protect your lungs. It is normal to cough occasionally, but a cough that happens with other symptoms or lasts a long time may be a sign of a condition that needs treatment. A cough may last only 2-3 weeks (acute), or it may last longer than 8 weeks (chronic). CAUSES Coughing is commonly caused by:  Breathing in substances that irritate your lungs.  A viral or bacterial respiratory infection.  Allergies.  Asthma.  Postnasal drip.  Smoking.  Acid backing up from the stomach into the esophagus (gastroesophageal reflux).  Certain medicines.  Chronic lung problems, including COPD (or rarely, lung cancer).  Other medical conditions such as heart failure. HOME CARE INSTRUCTIONS  Pay attention to any changes in your symptoms. Take these actions to help with your discomfort:  Take medicines only as told by your health care provider.  If you were prescribed an antibiotic medicine, take it as told by your health care provider. Do not stop taking the antibiotic even if you start to feel better.  Talk with  your health care provider before you take a cough suppressant medicine.  Drink enough fluid to keep your urine clear or pale yellow.  If the air is dry, use a cold steam vaporizer or humidifier in your bedroom or your home to help loosen secretions.  Avoid anything that causes you to cough at work or at home.  If your cough is worse at night, try sleeping in a semi-upright position.  Avoid cigarette smoke. If you smoke, quit smoking. If you need help quitting, ask your health care provider.  Avoid caffeine.  Avoid alcohol.  Rest as needed. SEEK MEDICAL CARE IF:   You have new symptoms.  You cough up pus.  Your cough does not get better after 2-3 weeks, or your cough gets worse.  You cannot control your cough with suppressant medicines and you are losing sleep.  You develop pain that is getting worse or pain that is not controlled with pain medicines.  You have a fever.  You have unexplained weight loss.  You have night sweats. SEEK IMMEDIATE MEDICAL CARE IF:  You cough up Howell.  You have difficulty breathing.  Your heartbeat is very fast.   This information is not intended to replace advice given to you by your health care provider. Make sure you discuss any questions you have with your health care provider.   Document Released: 05/07/2011 Document Revised: 07/30/2015 Document Reviewed: 01/15/2015 Elsevier Interactive Patient Education 2016 [employer].    IF you received an x-Howell today, you will receive an invoice from [facility]. Please contact [facility] at [phone] with questions or concerns regarding your invoice.   IF you received labwork today, you will receive an invoice from [employer]. Please contact [facility] at [phone] with questions or concerns regarding your invoice.   Our billing staff will not be able to assist you with questions regarding bills from these companies.  You will be contacted  with the lab results as soon as they are available. The fastest way to get your results is to activate your My Chart account. Instructions are located on the last page of this paperwork. If you have not heard from [place] regarding the results in 2 weeks, please contact this office.        I personally performed the services described in this documentation, which was scribed in my presence. The recorded information has been reviewed and considered, and addended by me as needed.   Signed,   Frank Ray, MD [facility].  10/04/16 6:27 PM

## 2016-10-04 NOTE — Patient Instructions (Addendum)
Call your dermatologist for office visit if rash is not improving with cream provided. Return to the clinic or go to the nearest emergency room if any of your symptoms worsen or new symptoms occur.  Your infection fighting cells were elevated on blood work today, so with your increased cough, possible early pneumonia. Start azithromycin 2 pills today, then 1 pill for the next 4 days. Follow-up in the next 1 week if not improving, sooner if worse, or if you have any fevers in the next few days.   Cough, Adult Coughing is a reflex that clears your throat and your airways. Coughing helps to heal and protect your lungs. It is normal to cough occasionally, but a cough that happens with other symptoms or lasts a long time may be a sign of a condition that needs treatment. A cough may last only 2-3 weeks (acute), or it may last longer than 8 weeks (chronic). CAUSES Coughing is commonly caused by:  Breathing in substances that irritate your lungs.  A viral or bacterial respiratory infection.  Allergies.  Asthma.  Postnasal drip.  Smoking.  Acid backing up from the stomach into the esophagus (gastroesophageal reflux).  Certain medicines.  Chronic lung problems, including COPD (or rarely, lung cancer).  Other medical conditions such as heart failure. HOME CARE INSTRUCTIONS  Pay attention to any changes in your symptoms. Take these actions to help with your discomfort:  Take medicines only as told by your health care provider.  If you were prescribed an antibiotic medicine, take it as told by your health care provider. Do not stop taking the antibiotic even if you start to feel better.  Talk with your health care provider before you take a cough suppressant medicine.  Drink enough fluid to keep your urine clear or pale yellow.  If the air is dry, use a cold steam vaporizer or humidifier in your bedroom or your home to help loosen secretions.  Avoid anything that causes you to cough  at work or at home.  If your cough is worse at night, try sleeping in a semi-upright position.  Avoid cigarette smoke. If you smoke, quit smoking. If you need help quitting, ask your health care provider.  Avoid caffeine.  Avoid alcohol.  Rest as needed. SEEK MEDICAL CARE IF:   You have new symptoms.  You cough up pus.  Your cough does not get better after 2-3 weeks, or your cough gets worse.  You cannot control your cough with suppressant medicines and you are losing sleep.  You develop pain that is getting worse or pain that is not controlled with pain medicines.  You have a fever.  You have unexplained weight loss.  You have night sweats. SEEK IMMEDIATE MEDICAL CARE IF:  You cough up blood.  You have difficulty breathing.  Your heartbeat is very fast.   This information is not intended to replace advice given to you by your health care provider. Make sure you discuss any questions you have with your health care provider.   Document Released: 05/07/2011 Document Revised: 07/30/2015 Document Reviewed: 01/15/2015 Elsevier Interactive Patient Education 2016 [employer].    IF you received an x-ray today, you will receive an invoice from [facility] Radiology. Please contact [facility] Radiology at [phone] with questions or concerns regarding your invoice.   IF you received labwork today, you will receive an invoice from [employer]. Please contact [facility] at [phone] with questions or concerns regarding your invoice.   Our billing staff  will not be able to assist you with questions regarding bills from these companies.  You will be contacted with the lab results as soon as they are available. The fastest way to get your results is to activate your My Chart account. Instructions are located on the last page of this paperwork. If you have not heard from [place] regarding the results in 2 weeks, please contact this office.

## 2016-10-06 ENCOUNTER — Encounter: Payer: Self-pay | Admitting: Family Medicine

## 2016-10-07 ENCOUNTER — Telehealth: Payer: Self-pay

## 2016-10-07 ENCOUNTER — Encounter: Payer: Self-pay | Admitting: Family Medicine

## 2016-10-07 NOTE — Telephone Encounter (Signed)
This is a duplicate. I forwarded the one containing info to Dr Carlota Raspberry.

## 2016-10-07 NOTE — Telephone Encounter (Signed)
Dr Carlota Raspberry, there is also an email from the pt about this that I routed to you with add'l info.

## 2016-10-07 NOTE — Telephone Encounter (Signed)
Patient saw Dr. Carlota Raspberry a few days ago and he was prescribed Mucinex.  Patient is concerned about taking Mucinex since he has had a skin reaction and it says on the bottle to ask a provider before taking.  Please advise when possible  [phone]

## 2016-10-07 NOTE — Telephone Encounter (Signed)
Dr Carlota Raspberry, I assume the Mucinex may cause a rash if pt has allergic reaction, but wanted to check with you that it would not be contraindicated with pt's current rash. Also, didn't know if you wanted to recommend any further medications for head congestion?

## 2016-10-11 NOTE — Telephone Encounter (Signed)
This was addressed by Dr Carlota Raspberry by email.

## 2016-10-18 ENCOUNTER — Other Ambulatory Visit: Payer: Self-pay | Admitting: Family Medicine

## 2016-10-18 DIAGNOSIS — E785 Hyperlipidemia, unspecified: Secondary | ICD-10-CM

## 2016-10-20 NOTE — Telephone Encounter (Signed)
09/2016 last ov and cmp/glucose 04/2016 last lipid

## 2016-10-21 ENCOUNTER — Encounter: Payer: Self-pay | Admitting: Family Medicine

## 2016-10-21 ENCOUNTER — Ambulatory Visit (INDEPENDENT_AMBULATORY_CARE_PROVIDER_SITE_OTHER): Payer: Medicare Other | Admitting: Family Medicine

## 2016-10-21 VITALS — BP 120/90 | HR 64 | Temp 98.5°F | Resp 16 | Ht 68.0 in | Wt 165.0 lb

## 2016-10-21 DIAGNOSIS — D72829 Elevated white blood cell count, unspecified: Secondary | ICD-10-CM

## 2016-10-21 DIAGNOSIS — R350 Frequency of micturition: Secondary | ICD-10-CM

## 2016-10-21 DIAGNOSIS — R7989 Other specified abnormal findings of blood chemistry: Secondary | ICD-10-CM | POA: Diagnosis not present

## 2016-10-21 DIAGNOSIS — R739 Hyperglycemia, unspecified: Secondary | ICD-10-CM

## 2016-10-21 LAB — CBC WITH DIFFERENTIAL/PLATELET
Basophils Absolute: 53 cells/uL (ref 0–200)
Basophils Relative: 1 %
EOS PCT: 19 %
Eosinophils Absolute: 1007 cells/uL — ABNORMAL HIGH (ref 15–500)
HCT: 47.3 % (ref 38.5–50.0)
Hemoglobin: 15.8 g/dL (ref 13.2–17.1)
LYMPHS PCT: 28 %
Lymphs Abs: 1484 cells/uL (ref 850–3900)
MCH: 29.5 pg (ref 27.0–33.0)
MCHC: 33.4 g/dL (ref 32.0–36.0)
MCV: 88.2 fL (ref 80.0–100.0)
MPV: 11.8 fL (ref 7.5–12.5)
Monocytes Absolute: 424 cells/uL (ref 200–950)
Monocytes Relative: 8 %
NEUTROS PCT: 44 %
Neutro Abs: 2332 cells/uL (ref 1500–7800)
Platelets: 219 10*3/uL (ref 140–400)
RBC: 5.36 MIL/uL (ref 4.20–5.80)
RDW: 14.7 % (ref 11.0–15.0)
WBC: 5.3 10*3/uL (ref 3.8–10.8)

## 2016-10-21 LAB — POCT URINALYSIS DIP (MANUAL ENTRY)
Bilirubin, UA: NEGATIVE
GLUCOSE UA: NEGATIVE
Ketones, POC UA: NEGATIVE
Leukocytes, UA: NEGATIVE
NITRITE UA: NEGATIVE
PH UA: 7
Protein Ur, POC: NEGATIVE
RBC UA: NEGATIVE
Spec Grav, UA: 1.01
UROBILINOGEN UA: 0.2

## 2016-10-21 NOTE — Progress Notes (Signed)
By signing my name below, I, Mesha Guinyard, attest that this documentation has been prepared under the direction and in the presence of [name], MD.  Electronically Signed: Verlee Monte, Medical Scribe. 10/21/16. 8:44 AM.  Subjective:    Patient ID: Frank Howell, male    DOB: June 21, 1951, 65 y.o.   MRN: [id number]  HPI Chief Complaint  Patient presents with  . Diabetes  . Hypertension    HPI Comments: Frank Howell is a 65 y.o. male who presents to the [facility] for hyperglycemia and HTN follow-up.  Hyperglycemia: He had a glucose of 143 at office visit on 11/9; no hx of DM.  He was never dx with DM. His rash has improved since last visit and he saw his dermatologist yesterday. He did receive a steroid injection for drug reaction prior to last visit when he was in [place]. Has noticed frequent urination for months - no recent changes or dysuria.   Elevated Leukocytes: Borderline elevated on 11/9 up to 11/13 at the time of a cough. Thought to have possible early CAP. Started on zpak. States his cough has improved. Denies recent fevers, and chills.  HTN: BP was controled at last visit; on atenolol, losartan-HCTZ. He's had elevated creatinine ranging from 1.45-1.59 over the past year.  Denies using ASA, or NSAIDs. Lab Results  Component Value Date   CREATININE 1.45 (H) 09/30/2016   BP Readings from Last 3 Encounters:  10/21/16 120/90  10/04/16 130/86  09/30/16 (!) 152/82   Urinary Frequency: For the past 5 months. Denies pain from urination, dysuria, hematuria, polydipsia, blurry vision, abdominal pain. Lab Results  Component Value Date   PSA 2.40 04/29/2016   PSA 3.17 12/03/2014   PSA 2.10 05/17/2013    Patient Active Problem List   Diagnosis Date Noted  . Pain in the chest   . Chest pain 08/08/2015  . Psoriasis 08/08/2015  . Asthma 01/10/2012  . Hypertension 01/10/2012  . Hyperlipidemia 01/10/2012  . GERD 01/20/2010  . FECAL OCCULT BLOOD  01/20/2010   Past Medical History:  Diagnosis Date  . Abnormal liver function   . Allergy   . Asthma    per patient has not had asthma attack in five years  . Dermatitis   . GERD (gastroesophageal reflux disease)    patient stated GERD is no longer a problem for him  . Hyperlipidemia   . Hypertension   . Psoriasis    No past surgical history on file. Allergies  Allergen Reactions  . Malarone [Atovaquone-Proguanil Hcl] Rash  . Penicillins    Prior to Admission medications   Medication Sig Start Date End Date Taking? Authorizing Provider  Adalimumab (HUMIRA) 40 MG/0.8ML PSKT Inject 40 mg into the skin every 30 (thirty) days.    Historical Provider, MD  albuterol (PROVENTIL HFA;VENTOLIN HFA) 108 (90 BASE) MCG/ACT inhaler Inhale 2 puffs into the lungs every 6 (six) hours as needed for wheezing. 08/12/15   Orma Flaming, MD  aspirin EC 81 MG tablet Take 81 mg by mouth every 4 (four) hours as needed for mild pain or moderate pain. Reported on 05/24/2016    Historical Provider, MD  atenolol (TENORMIN) 50 MG tablet Take 1 tablet (50 mg total) by mouth daily. 09/30/16   Darlyne Russian, MD  atovaquone-proguanil (MALARONE) 250-100 MG TABS tablet Take 1 tablet by mouth daily. Start 1-2 days prior to travel and stop 7 days after return 09/10/16   Harrison Mons, PA-C  augmented betamethasone  dipropionate (DIPROLENE-AF) [id number] % ointment APPLICATIONS APPLY ON THE SKIN APPLY TWICE DAILY TO LEGS 03/12/16   Historical Provider, MD  azithromycin (ZITHROMAX) 250 MG tablet Take 2 pills by mouth on day 1, then 1 pill by mouth per day on days 2 through 5. 10/04/16   [name], MD  beclomethasone (QVAR) 80 MCG/ACT inhaler Inhale 1 puff into the lungs 2 (two) times daily. 04/29/16   [name], MD  fluticasone (FLOVENT HFA) 44 MCG/ACT inhaler Inhale 1 puff into the lungs 2 (two) times daily. 05/04/16   [name], MD  losartan-hydrochlorothiazide (HYZAAR) 100-12.5 MG tablet Take 1 tablet by mouth  daily. 04/29/16   [name], MD  rosuvastatin (CRESTOR) 5 MG tablet TAKE 1 TABLET BY MOUTH EVERY DAY 10/20/16   [name], MD  Triamcinolone Acetonide (TRIAMCINOLONE 0.1 % CREAM : EUCERIN) CREA Apply 1 application topically 3 (three) times daily. Apply twice a day to affected area. Mix is 240 g of triamcinolone with 240 g of Eucerin 09/30/16   Darlyne Russian, MD  Zoster Vaccine Live, PF, (ZOSTAVAX) 24401 UNT/0.65ML injection Inject 19,400 Units into the skin once. 04/29/16   [name], MD   Social History   Social History  . Marital status: Married    Spouse name: N/A  . Number of children: N/A  . Years of education: N/A   Occupational History  . Not on file.   Social History Main Topics  . Smoking status: Never Smoker  . Smokeless tobacco: Never Used  . Alcohol use 0.0 oz/week     Comment: occasionally  . Drug use: No  . Sexual activity: Not on file   Other Topics Concern  . 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  Eyes: Negative for visual disturbance.  Respiratory: Negative for cough.   Gastrointestinal: Negative for abdominal pain.  Endocrine: Negative for polydipsia.  Genitourinary: Positive for frequency. Negative for dysuria and hematuria.  Skin: Negative for rash.   Objective:  Physical Exam  Constitutional: He is oriented to person, place, and time. He appears well-developed and well-nourished.  HENT:  Head: Normocephalic and atraumatic.  Eyes: EOM are normal. Pupils are equal, round, and reactive to light.  Neck: No JVD present. Carotid bruit is not present.  Cardiovascular: Normal rate, regular rhythm and normal heart sounds.  Exam reveals no gallop and no friction rub.   No murmur heard. Pulmonary/Chest: Effort normal and breath sounds  normal. No respiratory distress. He has no wheezes. He has no rales.  Abdominal: He exhibits no mass. There is no tenderness. There is no guarding.  Musculoskeletal: He exhibits no edema.  Neurological: He is alert and oriented to person, place, and time.  Skin: Skin is warm and dry.  Psychiatric: He has a normal mood and affect.  Vitals reviewed.  BP 120/90 (BP Location: Left Arm, Cuff Size: Normal)   Pulse 64   Temp 98.5 F (36.9 C) (Oral)   Resp 16   Ht 5\' 8"  (1.727 m)   Wt 165 lb (74.8 kg)   SpO2 98%   BMI 25.09 kg/m  Assessment & Plan:   Frank Howell is a 65 y.o. male Hyperglycemia - Plan: HM Diabetes Foot Exam, Hemoglobin  A1C  - History of diabetes. Elevated glucose recently may have been due in part to steroid use for drug reaction. Check A1c, recheck glucose on CMP.  Urinary frequency - Plan: POCT urinalysis dipstick, PSA, Hemoglobin A1C  -Check urinalysis, check A1c. RTC precautions if any dysuria or abdominal pain, hematuria or other new symptoms.  Leukocytosis, unspecified type - Plan: CBC with Differential/Platelet  -Likely due to previous cough/early CAP. Recheck CBC.  Elevated serum creatinine  -Check creatinine on CMP. Maintain hydration, avoid nephrotoxins such as NSAIDs.  No orders of the defined types were placed in this encounter.  Patient Instructions   For elevated kidney function test - make sure you stay well hydrated, and avoid NSIADS like Advil or Alleve. Tylenol is ok to take. I will recheck that level today.   For blood sugar - I will recheck that today with a 3 month average to screen for diabetes or prediabetes.   I will recheck your blood counts, but expect it to be better after treatment for cough.   I will also check a prostate test and urine test today, but that may be due to your increased blood sugar. Depending on results, we'll determine next follow-up but if everything is okay, recheck in 3-6 months. Sooner if any persistent urine  symptoms, or new/worsening symptoms.  Return to the clinic or go to the nearest emergency room if any of your symptoms worsen or new symptoms occur.    IF you received an x-ray today, you will receive an invoice from [facility]. Please contact [facility] at [phone] with questions or concerns regarding your invoice.   IF you received labwork today, you will receive an invoice from [employer]. Please contact [facility] at [phone] with questions or concerns regarding your invoice.   Our billing staff will not be able to assist you with questions regarding bills from these companies.  You will be contacted with the lab results as soon as they are available. The fastest way to get your results is to activate your My Chart account. Instructions are located on the last page of this paperwork. If you have not heard from [place] regarding the results in 2 weeks, please contact this office.         I personally performed the services described in this documentation, which was scribed in my presence. The recorded information has been reviewed and considered, and addended by me as needed.   Signed,   [name], MD [facility].  10/21/16 9:01 AM

## 2016-10-21 NOTE — Patient Instructions (Addendum)
For elevated kidney function test - make sure you stay well hydrated, and avoid NSIADS like Advil or Alleve. Tylenol is ok to take. I will recheck that level today.   For blood sugar - I will recheck that today with a 3 month average to screen for diabetes or prediabetes.   I will recheck your blood counts, but expect it to be better after treatment for cough.   I will also check a prostate test and urine test today, but that may be due to your increased blood sugar. Depending on results, we'll determine next follow-up but if everything is okay, recheck in 3-6 months. Sooner if any persistent urine symptoms, or new/worsening symptoms.  Return to the clinic or go to the nearest emergency room if any of your symptoms worsen or new symptoms occur.    IF you received an x-ray today, you will receive an invoice from [facility]. Please contact [facility] at [phone] with questions or concerns regarding your invoice.   IF you received labwork today, you will receive an invoice from [employer]. Please contact [facility] at [phone] with questions or concerns regarding your invoice.   Our billing staff will not be able to assist you with questions regarding bills from these companies.  You will be contacted with the lab results as soon as they are available. The fastest way to get your results is to activate your My Chart account. Instructions are located on the last page of this paperwork. If you have not heard from [place] regarding the results in 2 weeks, please contact this office.

## 2016-10-22 LAB — PSA: PSA: 3.5 ng/mL (ref <=4.0)

## 2016-10-22 LAB — HEMOGLOBIN A1C
HEMOGLOBIN A1C: 5.5 % (ref <5.7)
MEAN PLASMA GLUCOSE: 111 mg/dL

## 2016-11-07 ENCOUNTER — Encounter: Payer: Self-pay | Admitting: Family Medicine

## 2016-11-23 ENCOUNTER — Ambulatory Visit: Payer: Medicare Other

## 2016-12-02 ENCOUNTER — Ambulatory Visit (INDEPENDENT_AMBULATORY_CARE_PROVIDER_SITE_OTHER): Payer: Medicare Other

## 2016-12-02 ENCOUNTER — Encounter: Payer: Self-pay | Admitting: Family Medicine

## 2016-12-02 ENCOUNTER — Ambulatory Visit (INDEPENDENT_AMBULATORY_CARE_PROVIDER_SITE_OTHER): Payer: Medicare Other | Admitting: Family Medicine

## 2016-12-02 VITALS — BP 152/86 | HR 87 | Temp 98.3°F | Resp 18 | Ht 68.0 in | Wt 163.0 lb

## 2016-12-02 DIAGNOSIS — F524 Premature ejaculation: Secondary | ICD-10-CM

## 2016-12-02 DIAGNOSIS — M25552 Pain in left hip: Secondary | ICD-10-CM

## 2016-12-02 DIAGNOSIS — M79605 Pain in left leg: Secondary | ICD-10-CM

## 2016-12-02 DIAGNOSIS — R350 Frequency of micturition: Secondary | ICD-10-CM | POA: Diagnosis not present

## 2016-12-02 DIAGNOSIS — R972 Elevated prostate specific antigen [PSA]: Secondary | ICD-10-CM

## 2016-12-02 DIAGNOSIS — E785 Hyperlipidemia, unspecified: Secondary | ICD-10-CM | POA: Diagnosis not present

## 2016-12-02 DIAGNOSIS — N50819 Testicular pain, unspecified: Secondary | ICD-10-CM | POA: Diagnosis not present

## 2016-12-02 NOTE — Progress Notes (Signed)
By signing my name below I, Tereasa Coop, attest that this documentation has been prepared under the direction and in the presence of [name], MD. Electonically Signed. Tereasa Coop, Scribe 12/02/2016 at 10:42 AM  Subjective:    Patient ID: Frank Howell, male    DOB: [birth year], 66 y.o.   MRN: [id number]  Chief Complaint  Patient presents with  . Follow-up    left leg pain  . blood work for cholesterol    HPI Frank Howell is a 66 y.o. male who presents to the [facility] for follow up reevaluation offrequent urination, pelvic pain, leg pain, and premature ejaculation as discussed in pt email on 11/07/16. Pt was last seen 10/21/16 for hyperglycemia and elevated creatinine. A1C was 5.5 at that time. Pt was c/o frequent urination for months. PSA was 3.5, increased from 2.4 in June 2017. Note that PSA had been 3.1 in 2016. See My Chart email with multiple concerns including frequent urination, pelvic pain, leg pain, and premature ejaculation. Was advised to follow up to discuss these symptoms. Of note, normal urinalysis dip stick on 10/21/16. At time of PSA of 3.5.  Today pt states that frequent urination has remained the same. Denies dysuria or abd pain.  Also c/o left leg pain starting in upper lateral left hip and radiates all the way down to his left toes. Leg pain has been Frank Howell for the past year. Denies any low back pain, urinary or bowel incontinence, saddle anesthesia, or lower extremity weakness.   Pt reports having 3 episodes of testicular pain in the past year. Pt also reports having premature ejaculation problems for the past 2 years.   HLD Pt states that he has not eaten today and was hoping to have his cholesterol levels check today because he was not fasting at last visit.  Lab Results  Component Value Date   CHOL 204 (H) 04/29/2016   HDL 57 04/29/2016   LDLCALC 130 (H) 04/29/2016   TRIG 87 04/29/2016   CHOLHDL 3.6 04/29/2016   Takes 5mg   Crestor QD.    Patient Active Problem List   Diagnosis Date Noted  . Pain in the chest   . Chest pain 08/08/2015  . Psoriasis 08/08/2015  . Asthma 01/10/2012  . Hypertension 01/10/2012  . Hyperlipidemia 01/10/2012  . GERD 01/20/2010  . FECAL OCCULT BLOOD 01/20/2010   Past Medical History:  Diagnosis Date  . Abnormal liver function   . Allergy   . Asthma    per patient has not had asthma attack in five years  . Dermatitis   . GERD (gastroesophageal reflux disease)    patient stated GERD is no longer a problem for him  . Hyperlipidemia   . Hypertension   . Psoriasis    History reviewed. No pertinent surgical history. Allergies  Allergen Reactions  . Malarone [Atovaquone-Proguanil Hcl] Rash  . Penicillins    Prior to Admission medications   Medication Sig Start Date End Date Taking? Authorizing Provider  Adalimumab (HUMIRA) 40 MG/0.8ML PSKT Inject 40 mg into the skin every 30 (thirty) days.   Yes Historical Provider, MD  albuterol (PROVENTIL HFA;VENTOLIN HFA) 108 (90 BASE) MCG/ACT inhaler Inhale 2 puffs into the lungs every 6 (six) hours as needed for wheezing. 08/12/15  Yes Orma Flaming, MD  aspirin EC 81 MG tablet Take 81 mg by mouth every 4 (four) hours as needed for mild pain or moderate pain. Reported on 05/24/2016   Yes Historical Provider,  MD  atenolol (TENORMIN) 50 MG tablet Take 1 tablet (50 mg total) by mouth daily. 09/30/16  Yes Darlyne Russian, MD  augmented betamethasone dipropionate (DIPROLENE-AF) [id number] % ointment APPLICATIONS APPLY ON THE SKIN APPLY TWICE DAILY TO LEGS 03/12/16  Yes Historical Provider, MD  beclomethasone (QVAR) 80 MCG/ACT inhaler Inhale 1 puff into the lungs 2 (two) times daily. 04/29/16  Yes [name], MD  fluticasone (FLOVENT HFA) 44 MCG/ACT inhaler Inhale 1 puff into the lungs 2 (two) times daily. 05/04/16  Yes [name], MD  losartan-hydrochlorothiazide (HYZAAR) 100-12.5 MG tablet Take 1 tablet by mouth daily. 04/29/16  Yes [name], MD  rosuvastatin (CRESTOR) 5 MG tablet TAKE 1 TABLET BY MOUTH EVERY DAY 10/20/16  Yes [name], MD  Triamcinolone Acetonide (TRIAMCINOLONE 0.1 % CREAM : EUCERIN) CREA Apply 1 application topically 3 (three) times daily. Apply twice a day to affected area. Mix is 240 g of triamcinolone with 240 g of Eucerin 09/30/16  Yes Darlyne Russian, MD  atovaquone-proguanil (MALARONE) 250-100 MG TABS tablet Take 1 tablet by mouth daily. Start 1-2 days prior to travel and stop 7 days after return Patient not taking: Reported on 12/02/2016 09/10/16   Harrison Mons, PA-C  Zoster Vaccine Live, PF, (ZOSTAVAX) 60454 UNT/0.65ML injection Inject 19,400 Units into the skin once. Patient not taking: Reported on 12/02/2016 04/29/16   [name], MD   Social History   Social History  . Marital status: Married    Spouse name: N/A  . Number of children: N/A  . Years of education: N/A   Occupational History  . Not on file.   Social History Main Topics  . Smoking status: Never Smoker  . Smokeless tobacco: Never Used  . Alcohol use 0.0 oz/week     Comment: occasionally  . Drug use: No  . Sexual activity: Not on file   Other Topics Concern  . 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  Genitourinary: Positive for frequency and testicular pain. Negative for dysuria.       Positive for premature ejaculation.  Musculoskeletal: Negative for back pain.       Pt is positive for left hip pain radiating down left leg.  Neurological: Negative for weakness and numbness.       Objective:   Physical Exam  Constitutional: He is oriented to person, place, and time. He appears well-developed and well-nourished. No distress.  HENT:  Head: Normocephalic and atraumatic.  Eyes: Conjunctivae  are normal. Pupils are equal, round, and reactive to light.  Neck: Neck supple.  Cardiovascular: Normal rate.   Pulmonary/Chest: Effort normal.  Abdominal: Soft. Normal appearance and bowel sounds are normal. He exhibits no distension. There is no tenderness. There is no rigidity, no rebound, no guarding and no CVA tenderness.  Genitourinary: Prostate is not tender. Right testis shows no mass and no tenderness. Left testis shows no mass and no tenderness. No penile erythema. No discharge found.  Genitourinary Comments: No genital lesions noted.  Prostate is firm on rt side without apparent nodule.  Musculoskeletal: Normal range of motion.       Lumbar back: He exhibits normal range of motion, no tenderness and no bony tenderness.  Back exam: Sciatic notch is non-tender. Slight discomfort in left leg with forward flexion of back. Back ROM is otherwise intact w/o pain. Left hip exam: Minimal discomfort over the left ASIS. Minimal discomfort with minimal internal rotation of left hip. Pt has positive faber in left hip as well.  Neurological: He is alert and oriented to person, place, and time. He displays no Babinski's sign on the right side. He displays no Babinski's sign on the left side.  Reflex Scores:      Patellar reflexes are 2+ on the right side and 2+ on the left side.      Achilles reflexes are 2+ on the right side and 2+ on the left side. Able to heel and toe walk without pain or difficultly.  Negative seated straight leg raise.   Skin: Skin is warm and dry.  Psychiatric: He has a normal mood and affect. His behavior is normal.  Nursing note and vitals reviewed.    Vitals:   12/02/16 0959  BP: (!) 152/86  Pulse: 87  Resp: 18  Temp: 98.3 F (36.8 C)  TempSrc: Oral  SpO2: 98%  Weight: 163 lb (73.9 kg)  Height: 5\' 8"  (1.727 m)        Assessment & Plan:   Frank Howell is a 66 y.o. male Urinary frequency - Plan: Ambulatory referral to Urology, PSA Premature  ejaculation - Plan: Ambulatory referral to Urology, PSA Testicular pain - Plan: Ambulatory referral to UrologyIncreased prostate specific antigen (PSA) velocity - Plan: Ambulatory referral to Urology, PSA  - reassuring prior workup, but with persistent sx's, and some increase in PSA - refer to urology. Repeat PSA today.   Left hip pain - Plan: DG HIP UNILAT W OR W/O PELVIS 2-3 VIEWS LEFT, Care order/instruction: Left leg pain - Plan: DG Lumbar Spine 2-3 Views  - XR obtained, no concerns. 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 Trial of tylenol and rtc to recheck if persistent or refer to ortho.   Hyperlipidemia, unspecified hyperlipidemia type - Plan: Lipid panel, Comprehensive metabolic panel  - check labs, continue same dose statin for now.   No orders of the defined types were placed in this encounter.  Patient Instructions   I will repeat your prostate test, but based on your last PSA and urinary symptoms will refer you to a urologist to decide on other evaluation.  Your leg pain may be due to radiation from your back or from hip pain. I will check the x-rays today, but Tylenol as needed for now. If that pain persists, can look at other causes or have you evaluated by orthopedist if needed. Let me know how you're doing.  I also checked cholesterol tests today. No change in meds for now.     IF you received an x-ray today, you will receive an invoice from [facility]. Please contact [facility] at [phone] with questions or concerns regarding your invoice.   IF you received labwork today, you will receive an invoice from [facility]. Please contact [facility] at [phone] with questions or concerns regarding your invoice.   Our billing staff will not be able to assist you with questions regarding bills from these companies.  You will be contacted with the lab results as soon as they are available. The fastest way to get your results is to activate your My Chart account.  Instructions are located on the last page of this paperwork. If you have not heard from [place] regarding the results in 2 weeks, please contact  this office.       I personally performed the services described in this documentation, which was scribed in my presence. The recorded information has been reviewed and considered, and addended by me as needed.   Signed,   [name], MD [facility].  12/04/16 12:19 PM

## 2016-12-02 NOTE — Patient Instructions (Addendum)
I will repeat your prostate test, but based on your last PSA and urinary symptoms will refer you to a urologist to decide on other evaluation.  Your leg pain may be due to radiation from your back or from hip pain. I will check the x-rays today, but Tylenol as needed for now. If that pain persists, can look at other causes or have you evaluated by orthopedist if needed. Let me know how you're doing.  I also checked cholesterol tests today. No change in meds for now.     IF you received an x-ray today, you will receive an invoice from [facility]. Please contact [facility] at [phone] with questions or concerns regarding your invoice.   IF you received labwork today, you will receive an invoice from [facility]. Please contact [facility] at [phone] with questions or concerns regarding your invoice.   Our billing staff will not be able to assist you with questions regarding bills from these companies.  You will be contacted with the lab results as soon as they are available. The fastest way to get your results is to activate your My Chart account. Instructions are located on the last page of this paperwork. If you have not heard from [place] regarding the results in 2 weeks, please contact this office.

## 2016-12-03 LAB — COMPREHENSIVE METABOLIC PANEL
ALBUMIN: 4 g/dL (ref 3.6–4.8)
ALT: 19 IU/L (ref 0–44)
AST: 19 IU/L (ref 0–40)
Albumin/Globulin Ratio: 1.4 (ref 1.2–2.2)
Alkaline Phosphatase: 77 IU/L (ref 39–117)
BILIRUBIN TOTAL: 1.2 mg/dL (ref 0.0–1.2)
BUN/Creatinine Ratio: 8 — ABNORMAL LOW (ref 10–24)
BUN: 13 mg/dL (ref 8–27)
CHLORIDE: 101 mmol/L (ref 96–106)
CO2: 28 mmol/L (ref 18–29)
CREATININE: 1.54 mg/dL — AB (ref 0.76–1.27)
Calcium: 9.3 mg/dL (ref 8.6–10.2)
GFR calc Af Amer: 53 mL/min/{1.73_m2} — ABNORMAL LOW (ref >59)
GFR calc non Af Amer: 46 mL/min/{1.73_m2} — ABNORMAL LOW (ref >59)
GLUCOSE: 92 mg/dL (ref 65–99)
Globulin, Total: 2.9 g/dL (ref 1.5–4.5)
Potassium: 4.2 mmol/L (ref 3.5–5.2)
Sodium: 142 mmol/L (ref 134–144)
Total Protein: 6.9 g/dL (ref 6.0–8.5)

## 2016-12-03 LAB — PSA: PROSTATE SPECIFIC AG, SERUM: 2.7 ng/mL (ref 0.0–4.0)

## 2016-12-03 LAB — LIPID PANEL
CHOL/HDL RATIO: 2.8 ratio (ref 0.0–5.0)
Cholesterol, Total: 151 mg/dL (ref 100–199)
HDL: 54 mg/dL (ref >39)
LDL CALC: 80 mg/dL (ref 0–99)
Triglycerides: 86 mg/dL (ref 0–149)
VLDL Cholesterol Cal: 17 mg/dL (ref 5–40)

## 2016-12-06 ENCOUNTER — Encounter: Payer: Self-pay | Admitting: Family Medicine

## 2016-12-09 ENCOUNTER — Encounter: Payer: Self-pay | Admitting: Family Medicine

## 2017-02-28 ENCOUNTER — Other Ambulatory Visit: Payer: Self-pay | Admitting: Family Medicine

## 2017-02-28 DIAGNOSIS — E785 Hyperlipidemia, unspecified: Secondary | ICD-10-CM

## 2017-03-29 ENCOUNTER — Ambulatory Visit (INDEPENDENT_AMBULATORY_CARE_PROVIDER_SITE_OTHER): Payer: Medicare Other | Admitting: Family Medicine

## 2017-03-29 ENCOUNTER — Ambulatory Visit (INDEPENDENT_AMBULATORY_CARE_PROVIDER_SITE_OTHER): Payer: Medicare Other

## 2017-03-29 ENCOUNTER — Encounter: Payer: Self-pay | Admitting: Family Medicine

## 2017-03-29 VITALS — BP 154/96 | HR 82 | Temp 98.8°F | Resp 16

## 2017-03-29 DIAGNOSIS — I10 Essential (primary) hypertension: Secondary | ICD-10-CM | POA: Diagnosis not present

## 2017-03-29 DIAGNOSIS — S20212A Contusion of left front wall of thorax, initial encounter: Secondary | ICD-10-CM

## 2017-03-29 DIAGNOSIS — R0789 Other chest pain: Secondary | ICD-10-CM

## 2017-03-29 NOTE — Progress Notes (Signed)
Subjective:  By signing my name below, I, Frank Howell, attest that this documentation has been prepared under the direction and in the presence of [name], MD Electronically Signed: Ladene Artist, ED Scribe 03/29/2017 at 4:03 PM.   Patient ID: Frank Howell, male    DOB: 1951/02/25, 66 y.o.   MRN: [id number]  Chief Complaint  Patient presents with  . Fall    against desk left rib pain 03/28/17 pain no sob   HPI Frank Howell is a 66 y.o. male who presents to Primary Care at [facility] complaining of a fall that occurred yesterday. Pt states that he was working at his desk when a book fell behind it. He reached to grab the book when his rolling chair rolled from underneath him and he struck his left ribs on the corner of his computer stand. Pt reports [name], left chest wall pain that is exacerbated with deep breathing and with moving his left arm. He has taken Tylenol twice yesterday with temporary relief. Pt denies sob, cough or hemoptysis.  Patient Active Problem List   Diagnosis Date Noted  . Pain in the chest   . Chest pain 08/08/2015  . Psoriasis 08/08/2015  . Asthma 01/10/2012  . Hypertension 01/10/2012  . Hyperlipidemia 01/10/2012  . GERD 01/20/2010  . FECAL OCCULT BLOOD 01/20/2010   Past Medical History:  Diagnosis Date  . Abnormal liver function   . Allergy   . Asthma    per patient has not had asthma attack in five years  . Dermatitis   . GERD (gastroesophageal reflux disease)    patient stated GERD is no longer a problem for him  . Hyperlipidemia   . Hypertension   . Psoriasis    No past surgical history on file. Allergies  Allergen Reactions  . Malarone [Atovaquone-Proguanil Hcl] Rash  . Penicillins    Prior to Admission medications   Medication Sig Start Date End Date Taking? Authorizing Provider  Adalimumab (HUMIRA) 40 MG/0.8ML PSKT Inject 40 mg into the skin every 30 (thirty) days.    [provider]  albuterol (PROVENTIL HFA;VENTOLIN HFA)  108 (90 BASE) MCG/ACT inhaler Inhale 2 puffs into the lungs every 6 (six) hours as needed for wheezing. 08/12/15   Orma Flaming, MD  aspirin EC 81 MG tablet Take 81 mg by mouth every 4 (four) hours as needed for mild pain or moderate pain. Reported on 05/24/2016    [provider]  atenolol (TENORMIN) 50 MG tablet Take 1 tablet (50 mg total) by mouth daily. 09/30/16   Darlyne Russian, MD  augmented betamethasone dipropionate (DIPROLENE-AF) 9.62 % ointment APPLICATIONS APPLY ON THE SKIN APPLY TWICE DAILY TO LEGS 03/12/16   [provider]  beclomethasone (QVAR) 80 MCG/ACT inhaler Inhale 1 puff into the lungs 2 (two) times daily. 04/29/16   [name], MD  fluticasone (FLOVENT HFA) 44 MCG/ACT inhaler Inhale 1 puff into the lungs 2 (two) times daily. 05/04/16   [name], MD  losartan-hydrochlorothiazide (HYZAAR) 100-12.5 MG tablet Take 1 tablet by mouth daily. 04/29/16   [name], MD  rosuvastatin (CRESTOR) 5 MG tablet TAKE 1 TABLET BY MOUTH EVERY DAY 10/20/16   [name], MD  rosuvastatin (CRESTOR) 5 MG tablet TAKE 1 TABLET BY MOUTH EVERY DAY 02/28/17   [name], MD  Triamcinolone Acetonide (TRIAMCINOLONE 0.1 % CREAM : EUCERIN) CREA Apply 1 application topically 3 (three) times daily. Apply twice a day to affected area. Mix  is 240 g of triamcinolone with 240 g of Eucerin 09/30/16   Daub, Loura Back, MD   Social History   Social History  . Marital status: Married    Spouse name: N/A  . Number of children: N/A  . Years of education: N/A   Occupational History  . Not on file.   Social History Main Topics  . Smoking status: Never Smoker  . Smokeless tobacco: Never Used  . Alcohol use 0.0 oz/week     Comment: occasionally  . Drug use: No  . Sexual activity: Not on file   Other Topics Concern  . 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   Cardiovascular:       +Chest wall pain      Objective:   Physical Exam  Constitutional: He is oriented to person, place, and time. He appears well-developed and well-nourished. No distress.  HENT:  Head: Normocephalic and atraumatic.  Eyes: Conjunctivae and EOM are normal.  Neck: Neck supple. No tracheal deviation present.  Cardiovascular: Normal rate.   Pulmonary/Chest: Effort normal. No respiratory distress.  Lungs are clear to auscultation. Equal breath sounds bilaterally including anteriorly. No ecchymosis or subacute emphysema. Tender at the mid axillary line, approximally 6th/7th rib.  Musculoskeletal: Normal range of motion.  Neurological: He is alert and oriented to person, place, and time.  Skin: Skin is warm and dry.  Psychiatric: He has a normal mood and affect. His behavior is normal.  Nursing note and vitals reviewed.  Vitals:   03/29/17 1539  BP: (!) 142/85  Pulse: 82  Resp: 16  Temp: 98.8 F (37.1 C)  TempSrc: Oral  SpO2: 100%   Dg Ribs Unilateral W/chest Left  Result Date: 03/29/2017 CLINICAL DATA:  Left chest wall pain.  Rib contusion yesterday EXAM: LEFT RIBS AND CHEST - 3+ VIEW COMPARISON:  Chest two-view 08/08/2015 FINDINGS: No fracture or other bone lesions are seen involving the ribs. There is no evidence of pneumothorax or pleural effusion. Both lungs are clear. Heart size and mediastinal contours are within normal limits. IMPRESSION: Negative. Electronically Signed   By: Franchot Gallo M.D.   On: 03/29/2017 16:23      Assessment & Plan:   Frank Howell is a 66 y.o. male Rib contusion, left, initial encounter - Plan: DG Ribs Unilateral W/Chest Left  Chest wall pain - Plan: DG Ribs Unilateral W/Chest Left  Essential hypertension  Suspected contusion of chest wall, no fx seen on xray.  Symptomatic care discussed and RTC precautions.  Elevated BP likely due to pain. Can monitor at home, no med changes for now.   No orders of the defined types were placed in this encounter.  Patient Instructions   See information below on rib contusions and chest wall pain. Tylenol is fine to use over-the-counter as needed, heat or ice to affected area, gentle range of motion.  I will call you if any fractures/broken ribs noted on xray.   Make sure you taking deep breaths throughout the day, and if watching TV, take a deep breath every time a commercial comes on. If any worsening pain, shortness of breath, return for recheck.  If you need a stronger medication - let me know.  Rib Contusion A rib contusion is a deep bruise on your rib area. Contusions are the result of a blunt trauma that causes bleeding and injury to the tissues under the skin. A rib contusion may involve bruising of the ribs and of the skin and muscles in the area. The skin overlying the contusion may turn blue, purple, or yellow. Minor injuries will give you a painless contusion, but more severe contusions may stay painful and swollen for a few weeks. What are the causes? A contusion is usually caused by a blow, trauma, or direct force to an area of the body. This often occurs while playing contact sports. What are the signs or symptoms?  Swelling and redness of the injured area.  Discoloration of the injured area.  Tenderness and soreness of the injured area.  Pain with or without movement. How is this diagnosed? The diagnosis can be made by taking a medical history and performing a physical exam. An X-ray, CT scan, or MRI may be needed to determine if there were any associated injuries, such as broken bones (fractures) or internal injuries. How is this treated? Often, the best treatment for a rib contusion is rest. Icing or applying cold compresses to the injured area may help reduce swelling and inflammation. Deep breathing exercises may be recommended to reduce  the risk of partial lung collapse and pneumonia. Over-the-counter or prescription medicines may also be recommended for pain control. Follow these instructions at home:  Apply ice to the injured area:  Put ice in a plastic bag.  Place a towel between your skin and the bag.  Leave the ice on for 20 minutes, 2-3 times per day.  Take medicines only as directed by your health care provider.  Rest the injured area. Avoid strenuous activity and any activities or movements that cause pain. Be careful during activities and avoid bumping the injured area.  Perform deep-breathing exercises as directed by your health care provider.  Do not lift anything that is heavier than 5 lb (2.3 kg) until your health care provider approves.  Do not use any tobacco products, including cigarettes, chewing tobacco, or electronic cigarettes. If you need help quitting, ask your health care provider. Contact a health care provider if:  You have increased bruising or swelling.  You have pain that is not controlled with treatment.  You have a fever. Get help right away if:  You have difficulty breathing or shortness of breath.  You develop a continual cough, or you cough up thick or bloody sputum.  You feel sick to your stomach (nauseous), you throw up (vomit), or you have abdominal pain. This information is not intended to replace advice given to you by your health care provider. Make sure you discuss any questions you have with your health care provider. Document Released: 08/03/2001 Document Revised: 04/15/2016 Document Reviewed: 08/20/2014 Elsevier Interactive Patient Education  2017 [employer].   IF you received an x-ray today, you will receive an invoice from [facility]. Please contact [facility] at [phone] with questions or concerns regarding your invoice.   IF you received labwork today, you will receive an invoice from [place]. Please contact [facility] at [phone]  with questions or concerns regarding your invoice.   Our billing staff will not be able to assist you with questions regarding bills from these companies.  You will be contacted with the lab results as soon as they are available. The fastest way to get your results is to activate your My Chart  account. Instructions are located on the last page of this paperwork. If you have not heard from [place] regarding the results in 2 weeks, please contact this office.       I personally performed the services described in this documentation, which was scribed in my presence. The recorded information has been reviewed and considered for accuracy and completeness, addended by me as needed, and agree with information above.  Signed,   [name], MD [facility] at [facility].  03/30/17 2:55 PM

## 2017-03-29 NOTE — Patient Instructions (Addendum)
See information below on rib contusions and chest wall pain. Tylenol is fine to use over-the-counter as needed, heat or ice to affected area, gentle range of motion.  I will call you if any fractures/broken ribs noted on xray.   Make sure you taking deep breaths throughout the day, and if watching TV, take a deep breath every time a commercial comes on. If any worsening pain, shortness of breath, return for recheck.  If you need a stronger medication - let me know.    Rib Contusion A rib contusion is a deep bruise on your rib area. Contusions are the result of a blunt trauma that causes bleeding and injury to the tissues under the skin. A rib contusion may involve bruising of the ribs and of the skin and muscles in the area. The skin overlying the contusion may turn blue, purple, or yellow. Minor injuries will give you a painless contusion, but more severe contusions may stay painful and swollen for a few weeks. What are the causes? A contusion is usually caused by a blow, trauma, or direct force to an area of the body. This often occurs while playing contact sports. What are the signs or symptoms?  Swelling and redness of the injured area.  Discoloration of the injured area.  Tenderness and soreness of the injured area.  Pain with or without movement. How is this diagnosed? The diagnosis can be made by taking a medical history and performing a physical exam. An X-ray, CT scan, or MRI may be needed to determine if there were any associated injuries, such as broken bones (fractures) or internal injuries. How is this treated? Often, the best treatment for a rib contusion is rest. Icing or applying cold compresses to the injured area may help reduce swelling and inflammation. Deep breathing exercises may be recommended to reduce the risk of partial lung collapse and pneumonia. Over-the-counter or prescription medicines may also be recommended for pain control. Follow these instructions at  home:  Apply ice to the injured area:  Put ice in a plastic bag.  Place a towel between your skin and the bag.  Leave the ice on for 20 minutes, 2-3 times per day.  Take medicines only as directed by your health care provider.  Rest the injured area. Avoid strenuous activity and any activities or movements that cause pain. Be careful during activities and avoid bumping the injured area.  Perform deep-breathing exercises as directed by your health care provider.  Do not lift anything that is heavier than 5 lb (2.3 kg) until your health care provider approves.  Do not use any tobacco products, including cigarettes, chewing tobacco, or electronic cigarettes. If you need help quitting, ask your health care provider. Contact a health care provider if:  You have increased bruising or swelling.  You have pain that is not controlled with treatment.  You have a fever. Get help right away if:  You have difficulty breathing or shortness of breath.  You develop a continual cough, or you cough up thick or bloody sputum.  You feel sick to your stomach (nauseous), you throw up (vomit), or you have abdominal pain. This information is not intended to replace advice given to you by your health care provider. Make sure you discuss any questions you have with your health care provider. Document Released: 08/03/2001 Document Revised: 04/15/2016 Document Reviewed: 08/20/2014 Elsevier Interactive Patient Education  2017 [employer].   IF you received an x-ray today, you will receive an invoice from  [facility]. Please contact [facility] at [phone] with questions or concerns regarding your invoice.   IF you received labwork today, you will receive an invoice from [place]. Please contact [facility] at [phone] with questions or concerns regarding your invoice.   Our billing staff will not be able to assist you with questions regarding bills from these companies.  You  will be contacted with the lab results as soon as they are available. The fastest way to get your results is to activate your My Chart account. Instructions are located on the last page of this paperwork. If you have not heard from [place] regarding the results in 2 weeks, please contact this office.

## 2017-03-30 ENCOUNTER — Telehealth: Payer: Self-pay | Admitting: Family Medicine

## 2017-03-30 NOTE — Telephone Encounter (Signed)
[place] gave pt results

## 2017-03-30 NOTE — Telephone Encounter (Signed)
Dr Carlota Raspberry Pt calling about his xray

## 2017-05-06 ENCOUNTER — Other Ambulatory Visit: Payer: Self-pay | Admitting: Family Medicine

## 2017-05-12 ENCOUNTER — Encounter: Payer: Self-pay | Admitting: Family Medicine

## 2017-05-12 ENCOUNTER — Ambulatory Visit: Payer: Medicare Other | Admitting: Emergency Medicine

## 2017-05-13 ENCOUNTER — Encounter: Payer: Self-pay | Admitting: Family Medicine

## 2017-05-13 ENCOUNTER — Ambulatory Visit (INDEPENDENT_AMBULATORY_CARE_PROVIDER_SITE_OTHER): Payer: Medicare Other | Admitting: Family Medicine

## 2017-05-13 VITALS — BP 139/83 | HR 78 | Temp 98.0°F | Resp 18 | Ht 68.0 in | Wt 163.0 lb

## 2017-05-13 DIAGNOSIS — J4520 Mild intermittent asthma, uncomplicated: Secondary | ICD-10-CM | POA: Diagnosis not present

## 2017-05-13 MED ORDER — BUDESONIDE 90 MCG/ACT IN AEPB: 2.0000 | INHALATION_SPRAY | Freq: Two times a day (BID) | RESPIRATORY_TRACT | 5 refills | Status: DC

## 2017-05-13 NOTE — Patient Instructions (Addendum)
Based on how infrequently you use the Advair, I do not think you need to be on that strong of a medication.   Change to Pulmicort Flexhaler 2 puffs twice per day. You can continue to use albuterol if needed for breakthrough symptoms. If you require albuterol more than 2 times per week, or any nighttime symptoms that cause you to wake up with wheezing, follow-up to discuss other treatments. Additionally see information below on foods to avoid that may cause more reflux, as some of your upper airway symptoms may be due to heartburn. Recheck within the next 3 months, sooner if worse.   Food Choices for Gastroesophageal Reflux Disease, Adult When you have gastroesophageal reflux disease (GERD), the foods you eat and your eating habits are very important. Choosing the right foods can help ease the discomfort of GERD. Consider working with a diet and nutrition specialist (dietitian) to help you make healthy food choices. What general guidelines should I follow? Eating plan  Choose healthy foods low in fat, such as fruits, vegetables, whole grains, low-fat dairy products, and lean meat, fish, and poultry.  Eat frequent, small meals instead of three large meals each day. Eat your meals slowly, in a relaxed setting. Avoid bending over or lying down until 2-3 hours after eating.  Limit high-fat foods such as fatty meats or fried foods.  Limit your intake of oils, butter, and shortening to less than 8 teaspoons each day.  Avoid the following: ? Foods that cause symptoms. These may be different for different people. Keep a food diary to keep track of foods that cause symptoms. ? Alcohol. ? Drinking large amounts of liquid with meals. ? Eating meals during the 2-3 hours before bed.  Cook foods using methods other than frying. This may include baking, grilling, or broiling. Lifestyle   Maintain a healthy weight. Ask your health care provider what weight is healthy for you. If you need to lose weight,  work with your health care provider to do so safely.  Exercise for at least 30 minutes on 5 or more days each week, or as told by your health care provider.  Avoid wearing clothes that fit tightly around your waist and chest.  Do not use any products that contain nicotine or tobacco, such as cigarettes and e-cigarettes. If you need help quitting, ask your health care provider.  Sleep with the head of your bed raised. Use a wedge under the mattress or blocks under the bed frame to raise the head of the bed. What foods are not recommended? The items listed may not be a complete list. Talk with your dietitian about what dietary choices are best for you. Grains Pastries or quick breads with added fat. [place] toast. Vegetables Deep fried vegetables. [place] fries. Any vegetables prepared with added fat. Any vegetables that cause symptoms. For some people this may include tomatoes and tomato products, chili peppers, onions and garlic, and horseradish. Fruits Any fruits prepared with added fat. Any fruits that cause symptoms. For some people this may include citrus fruits, such as oranges, grapefruit, pineapple, and lemons. Meats and other protein foods High-fat meats, such as fatty beef or pork, hot dogs, ribs, ham, sausage, salami and bacon. Fried meat or protein, including fried fish and fried chicken. Nuts and nut butters. Dairy Whole milk and chocolate milk. Sour cream. Cream. Ice cream. Cream cheese. Milk shakes. Beverages Coffee and tea, with or without caffeine. Carbonated beverages. Sodas. Energy drinks. Fruit juice made with acidic fruits (such as  orange or grapefruit). Tomato juice. Alcoholic drinks. Fats and oils Butter. Margarine. Shortening. Ghee. Sweets and desserts Chocolate and cocoa. Donuts. Seasoning and other foods Pepper. Peppermint and spearmint. Any condiments, herbs, or seasonings that cause symptoms. For some people, this may include curry, hot sauce, or vinegar-based  salad dressings. Summary  When you have gastroesophageal reflux disease (GERD), food and lifestyle choices are very important to help ease the discomfort of GERD.  Eat frequent, small meals instead of three large meals each day. Eat your meals slowly, in a relaxed setting. Avoid bending over or lying down until 2-3 hours after eating.  Limit high-fat foods such as fatty meat or fried foods. This information is not intended to replace advice given to you by your health care provider. Make sure you discuss any questions you have with your health care provider. Document Released: 11/08/2005 Document Revised: 11/09/2016 Document Reviewed: 11/09/2016 Elsevier Interactive Patient Education  2017 [employer].   Asthma, Adult Asthma is a recurring condition in which the airways tighten and narrow. Asthma can make it difficult to breathe. It can cause coughing, wheezing, and shortness of breath. Asthma episodes, also called asthma attacks, range from minor to life-threatening. Asthma cannot be cured, but medicines and lifestyle changes can help control it. What are the causes? Asthma is believed to be caused by inherited (genetic) and environmental factors, but its exact cause is unknown. Asthma may be triggered by allergens, lung infections, or irritants in the air. Asthma triggers are different for each person. Common triggers include:  Animal dander.  Dust mites.  Cockroaches.  Pollen from trees or grass.  Mold.  Smoke.  Air pollutants such as dust, household cleaners, hair sprays, aerosol sprays, paint fumes, strong chemicals, or strong odors.  Cold air, weather changes, and winds (which increase molds and pollens in the air).  Strong emotional expressions such as crying or laughing hard.  Stress.  Certain medicines (such as aspirin) or types of drugs (such as beta-blockers).  Sulfites in foods and drinks. Foods and drinks that may contain sulfites include dried fruit, potato  chips, and sparkling grape juice.  Infections or inflammatory conditions such as the flu, a cold, or an inflammation of the nasal membranes (rhinitis).  Gastroesophageal reflux disease (GERD).  Exercise or strenuous activity.  What are the signs or symptoms? Symptoms may occur immediately after asthma is triggered or many hours later. Symptoms include:  Wheezing.  Excessive nighttime or early morning coughing.  Frequent or severe coughing with a common cold.  Chest tightness.  Shortness of breath.  How is this diagnosed? The diagnosis of asthma is made by a review of your medical history and a physical exam. Tests may also be performed. These may include:  Lung function studies. These tests show how much air you breathe in and out.  Allergy tests.  Imaging tests such as X-rays.  How is this treated? Asthma cannot be cured, but it can usually be controlled. Treatment involves identifying and avoiding your asthma triggers. It also involves medicines. There are 2 classes of medicine used for asthma treatment:  Controller medicines. These prevent asthma symptoms from occurring. They are usually taken every day.  Reliever or rescue medicines. These quickly relieve asthma symptoms. They are used as needed and provide short-term relief.  Your health care provider will help you create an asthma action plan. An asthma action plan is a written plan for managing and treating your asthma attacks. It includes a list of your asthma triggers and  how they may be avoided. It also includes information on when medicines should be taken and when their dosage should be changed. An action plan may also involve the use of a device called a peak flow meter. A peak flow meter measures how well the lungs are working. It helps you monitor your condition. Follow these instructions at home:  Take medicines only as directed by your health care provider. Speak with your health care provider if you have  questions about how or when to take the medicines.  Use a peak flow meter as directed by your health care provider. Record and keep track of readings.  Understand and use the action plan to help minimize or stop an asthma attack without needing to seek medical care.  Control your home environment in the following ways to help prevent asthma attacks: ? Do not smoke. Avoid being exposed to secondhand smoke. ? Change your heating and air conditioning filter regularly. ? Limit your use of fireplaces and wood stoves. ? Get rid of pests (such as roaches and mice) and their droppings. ? Throw away plants if you see mold on them. ? Clean your floors and dust regularly. Use unscented cleaning products. ? Try to have someone else vacuum for you regularly. Stay out of rooms while they are being vacuumed and for a short while afterward. If you vacuum, use a dust mask from a hardware store, a double-layered or microfilter vacuum cleaner bag, or a vacuum cleaner with a HEPA filter. ? Replace carpet with wood, tile, or vinyl flooring. Carpet can trap dander and dust. ? Use allergy-proof pillows, mattress covers, and box spring covers. ? Wash bed sheets and blankets every week in hot water and dry them in a dryer. ? Use blankets that are made of polyester or cotton. ? Clean bathrooms and kitchens with bleach. If possible, have someone repaint the walls in these rooms with mold-resistant paint. Keep out of the rooms that are being cleaned and painted. ? Wash hands frequently. Contact a health care provider if:  You have wheezing, shortness of breath, or a cough even if taking medicine to prevent attacks.  The colored mucus you cough up (sputum) is thicker than usual.  Your sputum changes from clear or white to yellow, green, gray, or bloody.  You have any problems that may be related to the medicines you are taking (such as a rash, itching, swelling, or trouble breathing).  You are using a reliever  medicine more than 2-3 times per week.  Your peak flow is still at 50-79% of your personal best after following your action plan for 1 hour.  You have a fever. Get help right away if:  You seem to be getting worse and are unresponsive to treatment during an asthma attack.  You are short of breath even at rest.  You get short of breath when doing very little physical activity.  You have difficulty eating, drinking, or talking due to asthma symptoms.  You develop chest pain.  You develop a fast heartbeat.  You have a bluish color to your lips or fingernails.  You are light-headed, dizzy, or faint.  Your peak flow is less than 50% of your personal best. This information is not intended to replace advice given to you by your health care provider. Make sure you discuss any questions you have with your health care provider. Document Released: 11/08/2005 Document Revised: 04/21/2016 Document Reviewed: 06/07/2013 Elsevier Interactive Patient Education  2017 [employer].  IF you received an x-ray today, you will receive an invoice from [facility]. Please contact [facility] at [phone] with questions or concerns regarding your invoice.   IF you received labwork today, you will receive an invoice from [facility]. Please contact [facility] at [phone] with questions or concerns regarding your invoice.   Our billing staff will not be able to assist you with questions regarding bills from these companies.  You will be contacted with the lab results as soon as they are available. The fastest way to get your results is to activate your My Chart account. Instructions are located on the last page of this paperwork. If you have not heard from us regarding the results in 2 weeks, please contact this office.      

## 2017-05-13 NOTE — Progress Notes (Signed)
Subjective:  By signing my name below, I, Frank Howell, attest that this documentation has been prepared under the direction and in the presence of Frank Ray, MD. Electronically Signed: [name], [place]. 05/13/2017 , 6:26 PM .  Patient was seen in Room 11 .   Patient ID: Frank Howell, male    DOB: 13-Feb-1951, 66 y.o.   MRN: [id number] Chief Complaint  Patient presents with  . Medication Refill    Advair   HPI Frank Howell is a 66 y.o. male  He was last seen on May 8th for rib contusion, previously seen in Jan for hyperlipidemia and other acute concerns. He's here to refill Advair today, with history of asthma. This hasn't been discussed recently.   Patient states he's been on Advair for a long time. He uses it about once a day to once every other day; in an average week, he's used it about twice a week. He's been using his Albuterol inhaler because he's been out of his Advair. He used it twice a few days ago. Prior to running out of his Advair, he rarely used Albuterol with his Advair. He denies any chest pain.   He mentions noticing wheezing after eating spicy and oily foods.   Patient Active Problem List   Diagnosis Date Noted  . Pain in the chest   . Chest pain 08/08/2015  . Psoriasis 08/08/2015  . Asthma 01/10/2012  . Hypertension 01/10/2012  . Hyperlipidemia 01/10/2012  . GERD 01/20/2010  . FECAL OCCULT Howell 01/20/2010   Past Medical History:  Diagnosis Date  . Abnormal liver function   . Allergy   . Asthma    per patient has not had asthma attack in five years  . Dermatitis   . GERD (gastroesophageal reflux disease)    patient stated GERD is no longer a problem for him  . Hyperlipidemia   . Hypertension   . Psoriasis    History reviewed. No pertinent surgical history. Allergies  Allergen Reactions  . Malarone [Atovaquone-Proguanil Hcl] Rash  . Penicillins    Prior to Admission medications   Medication Sig Start Date End Date Taking? Authorizing  Provider  Adalimumab (HUMIRA) 40 MG/0.8ML PSKT Inject 40 mg into the skin every 30 (thirty) days.   Yes [provider]  albuterol (PROVENTIL HFA;VENTOLIN HFA) 108 (90 BASE) MCG/ACT inhaler Inhale 2 puffs into the lungs every 6 (six) hours as needed for wheezing. 08/12/15  Yes Guest, Benn Moulder, MD  aspirin EC 81 MG tablet Take 81 mg by mouth every 4 (four) hours as needed for mild pain or moderate pain. Reported on 05/24/2016   Yes [provider]  atenolol (TENORMIN) 50 MG tablet Take 1 tablet (50 mg total) by mouth daily. 09/30/16  Yes Frank Russian, MD  augmented betamethasone dipropionate (DIPROLENE-AF) 9.37 % ointment APPLICATIONS APPLY ON THE SKIN APPLY TWICE DAILY TO LEGS 03/12/16  Yes [provider]  Fluticasone-Salmeterol (ADVAIR DISKUS) 250-50 MCG/DOSE AEPB Inhale 1 puff into the lungs 2 (two) times daily.   Yes [provider]  losartan-hydrochlorothiazide (HYZAAR) 100-12.5 MG tablet Take 1 tablet by mouth daily. 04/29/16  Yes Frank Agreste, MD  rosuvastatin (CRESTOR) 5 MG tablet TAKE 1 TABLET BY MOUTH EVERY DAY 10/20/16  Yes Frank Agreste, MD  Triamcinolone Acetonide (TRIAMCINOLONE 0.1 % CREAM : EUCERIN) CREA Apply 1 application topically 3 (three) times daily. Apply twice a day to affected area. Mix is 240 g of triamcinolone with 240 g of Eucerin 09/30/16  Yes Frank Russian, MD   Social History   Social History  . Marital status: Married    Spouse name: N/A  . Number of children: N/A  . Years of education: N/A   Occupational History  . Not on file.   Social History Main Topics  . Smoking status: Never Smoker  . Smokeless tobacco: Never Used  . Alcohol use 0.0 oz/week     Comment: occasionally  . Drug use: No  . Sexual activity: Not on file   Other Topics Concern  . 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  Eyes: Negative for visual disturbance.  Respiratory: Positive for wheezing. Negative for cough, chest tightness and shortness of breath.   Cardiovascular: Negative for chest pain, palpitations and leg swelling.  Gastrointestinal: Negative for abdominal pain and Howell in stool.  Neurological: Negative for dizziness, light-headedness and headaches.       Objective:   Physical Exam  Constitutional: He is oriented to person, place, and time. He appears well-developed and well-nourished. No distress.  HENT:  Head: Normocephalic and atraumatic.  Eyes: EOM are normal. Pupils are equal, round, and reactive to light.  Neck: Neck supple.  Cardiovascular: Normal rate.   Pulmonary/Chest: Effort normal. No respiratory distress. He has wheezes (faint expiratory).  Musculoskeletal: Normal range of motion.  Neurological: He is alert and oriented to person, place, and time.  Skin: Skin is warm and dry.  Psychiatric: He has a normal mood and affect. His behavior is normal.  Nursing note and vitals reviewed.   Vitals:   05/13/17 1731  BP: 139/83  Pulse: 78  Resp: 18  Temp: 98 F (36.7 C)  TempSrc: Oral  SpO2: 98%  Weight: 163 lb (73.9 kg)  Height: 5\' 8"  (1.727 m)      Assessment & Plan:    Ori Trejos is a 66 y.o. male Mild intermittent asthma without complication - Plan: Budesonide 90 MCG/ACT inhaler  - Well controlled based on history and infrequent dosing of Advair.   -Will transition from Advair to low-dose inhaled corticosteroid with Pulmicort 2 puffs twice a day. Albuterol if needed.   -If still well controlled, trial off of inhaled corticosteroid with just short acting bronchodilator when necessary. RTC precautions based on symptoms and frequency were discussed.  Meds ordered this encounter  Medications  . Fluticasone-Salmeterol (ADVAIR DISKUS) 250-50 MCG/DOSE AEPB     Sig: Inhale 1 puff into the lungs 2 (two) times daily.  . Budesonide 90 MCG/ACT inhaler    Sig: Inhale 2 puffs into the lungs 2 (two) times daily.    Dispense:  1 Inhaler    Refill:  5   Patient Instructions    Based on how infrequently you use the Advair, I do not think you need to be on that strong of a medication.   Change to Pulmicort Flexhaler 2 puffs twice per day. You can continue to use albuterol if needed for breakthrough symptoms. If you require albuterol more than 2 times per week, or any nighttime symptoms that cause you to wake up with wheezing, follow-up to discuss other treatments. Additionally see information below on foods to avoid that may cause more reflux, as some of your upper airway symptoms may  be due to heartburn. Recheck within the next 3 months, sooner if worse.   Food Choices for Gastroesophageal Reflux Disease, Adult When you have gastroesophageal reflux disease (GERD), the foods you eat and your eating habits are very important. Choosing the right foods can help ease the discomfort of GERD. Consider working with a diet and nutrition specialist (dietitian) to help you make healthy food choices. What general guidelines should I follow? Eating plan  Choose healthy foods low in fat, such as fruits, vegetables, whole grains, low-fat dairy products, and lean meat, fish, and poultry.  Eat frequent, small meals instead of three large meals each day. Eat your meals slowly, in a relaxed setting. Avoid bending over or lying down until 2-3 hours after eating.  Limit high-fat foods such as fatty meats or fried foods.  Limit your intake of oils, butter, and shortening to less than 8 teaspoons each day.  Avoid the following: ? Foods that cause symptoms. These may be different for different people. Keep a food diary to keep track of foods that cause symptoms. ? Alcohol. ? Drinking large amounts of liquid with meals. ? Eating meals during the 2-3 hours before bed.  Cook  foods using methods other than frying. This may include baking, grilling, or broiling. Lifestyle   Maintain a healthy weight. Ask your health care provider what weight is healthy for you. If you need to lose weight, work with your health care provider to do so safely.  Exercise for at least 30 minutes on 5 or more days each week, or as told by your health care provider.  Avoid wearing clothes that fit tightly around your waist and chest.  Do not use any products that contain nicotine or tobacco, such as cigarettes and e-cigarettes. If you need help quitting, ask your health care provider.  Sleep with the head of your bed raised. Use a wedge under the mattress or blocks under the bed frame to raise the head of the bed. What foods are not recommended? The items listed may not be a complete list. Talk with your dietitian about what dietary choices are best for you. Grains Pastries or quick breads with added fat. [place] toast. Vegetables Deep fried vegetables. [place] fries. Any vegetables prepared with added fat. Any vegetables that cause symptoms. For some people this may include tomatoes and tomato products, chili peppers, onions and garlic, and horseradish. Fruits Any fruits prepared with added fat. Any fruits that cause symptoms. For some people this may include citrus fruits, such as oranges, grapefruit, pineapple, and lemons. Meats and other protein foods High-fat meats, such as fatty beef or pork, hot dogs, ribs, ham, sausage, salami and bacon. Fried meat or protein, including fried fish and fried chicken. Nuts and nut butters. Dairy Whole milk and chocolate milk. Sour cream. Cream. Ice cream. Cream cheese. Milk shakes. Beverages Coffee and tea, with or without caffeine. Carbonated beverages. Sodas. Energy drinks. Fruit juice made with acidic fruits (such as orange or grapefruit). Tomato juice. Alcoholic drinks. Fats and oils Butter. Margarine. Shortening. Ghee. Sweets and  desserts Chocolate and cocoa. Donuts. Seasoning and other foods Pepper. Peppermint and spearmint. Any condiments, herbs, or seasonings that cause symptoms. For some people, this may include curry, hot sauce, or vinegar-based salad dressings. Summary  When you have gastroesophageal reflux disease (GERD), food and lifestyle choices are very important to help ease the discomfort of GERD.  Eat frequent, small meals instead of three large meals each day. Eat your meals slowly, in a  relaxed setting. Avoid bending over or lying down until 2-3 hours after eating.  Limit high-fat foods such as fatty meat or fried foods. This information is not intended to replace advice given to you by your health care provider. Make sure you discuss any questions you have with your health care provider. Document Released: 11/08/2005 Document Revised: 11/09/2016 Document Reviewed: 11/09/2016 Elsevier Interactive Patient Education  2017 [employer].   Asthma, Adult Asthma is a recurring condition in which the airways tighten and narrow. Asthma can make it difficult to breathe. It can cause coughing, wheezing, and shortness of breath. Asthma episodes, also called asthma attacks, range from minor to life-threatening. Asthma cannot be cured, but medicines and lifestyle changes can help control it. What are the causes? Asthma is believed to be caused by inherited (genetic) and environmental factors, but its exact cause is unknown. Asthma may be triggered by allergens, lung infections, or irritants in the air. Asthma triggers are different for each person. Common triggers include:  Animal dander.  Dust mites.  Cockroaches.  Pollen from trees or grass.  Mold.  Smoke.  Air pollutants such as dust, household cleaners, hair sprays, aerosol sprays, paint fumes, strong chemicals, or strong odors.  Cold air, weather changes, and winds (which increase molds and pollens in the air).  Strong emotional expressions such  as crying or laughing hard.  Stress.  Certain medicines (such as aspirin) or types of drugs (such as beta-blockers).  Sulfites in foods and drinks. Foods and drinks that may contain sulfites include dried fruit, potato chips, and sparkling grape juice.  Infections or inflammatory conditions such as the flu, a cold, or an inflammation of the nasal membranes (rhinitis).  Gastroesophageal reflux disease (GERD).  Exercise or strenuous activity.  What are the signs or symptoms? Symptoms may occur immediately after asthma is triggered or many hours later. Symptoms include:  Wheezing.  Excessive nighttime or early morning coughing.  Frequent or severe coughing with a common cold.  Chest tightness.  Shortness of breath.  How is this diagnosed? The diagnosis of asthma is made by a review of your medical history and a physical exam. Tests may also be performed. These may include:  Lung function studies. These tests show how much air you breathe in and out.  Allergy tests.  Imaging tests such as X-rays.  How is this treated? Asthma cannot be cured, but it can usually be controlled. Treatment involves identifying and avoiding your asthma triggers. It also involves medicines. There are 2 classes of medicine used for asthma treatment:  Controller medicines. These prevent asthma symptoms from occurring. They are usually taken every day.  Reliever or rescue medicines. These quickly relieve asthma symptoms. They are used as needed and provide short-term relief.  Your health care provider will help you create an asthma action plan. An asthma action plan is a written plan for managing and treating your asthma attacks. It includes a list of your asthma triggers and how they may be avoided. It also includes information on when medicines should be taken and when their dosage should be changed. An action plan may also involve the use of a device called a peak flow meter. A peak flow meter  measures how well the lungs are working. It helps you monitor your condition. Follow these instructions at home:  Take medicines only as directed by your health care provider. Speak with your health care provider if you have questions about how or when to take the medicines.  Use  a peak flow meter as directed by your health care provider. Record and keep track of readings.  Understand and use the action plan to help minimize or stop an asthma attack without needing to seek medical care.  Control your home environment in the following ways to help prevent asthma attacks: ? Do not smoke. Avoid being exposed to secondhand smoke. ? Change your heating and air conditioning filter regularly. ? Limit your use of fireplaces and wood stoves. ? Get rid of pests (such as roaches and mice) and their droppings. ? Throw away plants if you see mold on them. ? Clean your floors and dust regularly. Use unscented cleaning products. ? Try to have someone else vacuum for you regularly. Stay out of rooms while they are being vacuumed and for a short while afterward. If you vacuum, use a dust mask from a hardware store, a double-layered or microfilter vacuum cleaner bag, or a vacuum cleaner with a HEPA filter. ? Replace carpet with wood, tile, or vinyl flooring. Carpet can trap dander and dust. ? Use allergy-proof pillows, mattress covers, and box spring covers. ? Wash bed sheets and blankets every week in hot water and dry them in a dryer. ? Use blankets that are made of polyester or cotton. ? Clean bathrooms and kitchens with bleach. If possible, have someone repaint the walls in these rooms with mold-resistant paint. Keep out of the rooms that are being cleaned and painted. ? Wash hands frequently. Contact a health care provider if:  You have wheezing, shortness of breath, or a cough even if taking medicine to prevent attacks.  The colored mucus you cough up (sputum) is thicker than usual.  Your sputum  changes from clear or white to yellow, green, gray, or bloody.  You have any problems that may be related to the medicines you are taking (such as a rash, itching, swelling, or trouble breathing).  You are using a reliever medicine more than 2-3 times per week.  Your peak flow is still at 50-79% of your personal best after following your action plan for 1 hour.  You have a fever. Get help right away if:  You seem to be getting worse and are unresponsive to treatment during an asthma attack.  You are short of breath even at rest.  You get short of breath when doing very little physical activity.  You have difficulty eating, drinking, or talking due to asthma symptoms.  You develop chest pain.  You develop a fast heartbeat.  You have a bluish color to your lips or fingernails.  You are light-headed, dizzy, or faint.  Your peak flow is less than 50% of your personal best. This information is not intended to replace advice given to you by your health care provider. Make sure you discuss any questions you have with your health care provider. Document Released: 11/08/2005 Document Revised: 04/21/2016 Document Reviewed: 06/07/2013 Elsevier Interactive Patient Education  2017 [employer].     IF you received an x-Howell today, you will receive an invoice from [facility]. Please contact [facility] at [phone] with questions or concerns regarding your invoice.   IF you received labwork today, you will receive an invoice from [facility]. Please contact [facility] at [phone] with questions or concerns regarding your invoice.   Our billing staff will not be able to assist you with questions regarding bills from these companies.  You will be contacted with the lab results as soon as they are available. The fastest way to  get your results is to activate your My Chart account. Instructions are located on the last page of this paperwork. If you have not heard from [place]  regarding the results in 2 weeks, please contact this office.       I personally performed the services described in this documentation, which was scribed in my presence. The recorded information has been reviewed and considered for accuracy and completeness, addended by me as needed, and agree with information above.  Signed,   Frank Ray, MD [facility].  05/15/17 10:28 PM

## 2017-05-17 ENCOUNTER — Telehealth: Payer: Self-pay

## 2017-05-17 NOTE — Telephone Encounter (Signed)
PA started Budesonide Inhaler Awaiting Response

## 2017-05-18 ENCOUNTER — Telehealth: Payer: Self-pay | Admitting: Family Medicine

## 2017-05-18 ENCOUNTER — Telehealth: Payer: Self-pay

## 2017-05-18 NOTE — Telephone Encounter (Signed)
Patient called stating that the pharmacy did not have any prescriptions for him to pick up. Viewed chart note. At last visit he was prescribed Budesonide inhaler &  . Fluticasone-Salmeterol (ADVAIR DISKUS) 250-50 MCG/DOSE AEPB    Sig: Inhale 1 puff into the lungs 2 (two) times daily.   Budesonide is still pending for PA; however; Advair Diskus was NOT sent to pharmacy. Please advise  Pt uses [facility] at [address] [facility] [address] [address]. [phone]

## 2017-05-18 NOTE — Telephone Encounter (Signed)
Not sure of the issue as he was changed from Advair to budesonide inhaler (did not refill Advair for that reason). See assessment and plan from last visit, I will copy of the patient instructions below.  Let me know if there are still questions.   "Based on how infrequently you use the Advair, I do not think you need to be on that strong of a medication.  Change to Pulmicort Flexhaler 2 puffs twice per day. You can continue to use albuterol if needed for breakthrough symptoms. If you require albuterol more than 2 times per week, or any nighttime symptoms that cause you to wake up with wheezing, follow-up to discuss other treatments"

## 2017-05-19 NOTE — Telephone Encounter (Signed)
Insurance sent form for Budesonide. Form has been completed and faxed back. Awaiting Response

## 2017-05-20 NOTE — Telephone Encounter (Signed)
Budesonide PA Approved. Pharmacy & Pt notified

## 2017-05-23 ENCOUNTER — Other Ambulatory Visit: Payer: Self-pay | Admitting: Family Medicine

## 2017-05-23 DIAGNOSIS — I10 Essential (primary) hypertension: Secondary | ICD-10-CM

## 2017-05-26 ENCOUNTER — Telehealth: Payer: Self-pay | Admitting: Family Medicine

## 2017-05-26 ENCOUNTER — Other Ambulatory Visit: Payer: Self-pay | Admitting: Emergency Medicine

## 2017-05-26 DIAGNOSIS — I10 Essential (primary) hypertension: Secondary | ICD-10-CM

## 2017-05-26 MED ORDER — LOSARTAN POTASSIUM-HCTZ 100-12.5 MG PO TABS: 1.0000 | ORAL_TABLET | Freq: Every day | ORAL | 3 refills | Status: DC

## 2017-05-26 NOTE — Telephone Encounter (Signed)
Losartan reordered and e-scribed to pharmacy Pt is aware

## 2017-05-26 NOTE — Telephone Encounter (Signed)
Pt is very upset that he was denied his refill on his losartin because he just saw Dr. Carlota Raspberry May 13, 2017 and he does understand that Dr. Carlota Raspberry is on vacation and that someone else reviews this message but he needs a return call back today due to the fact that he is out of medication and feels that no once will ever call him back regarding this   Best number [phone]

## 2017-05-26 NOTE — Telephone Encounter (Signed)
mychart message sent to patient about making an appt

## 2017-06-04 ENCOUNTER — Other Ambulatory Visit: Payer: Self-pay | Admitting: Family Medicine

## 2017-06-04 DIAGNOSIS — E785 Hyperlipidemia, unspecified: Secondary | ICD-10-CM

## 2017-07-06 ENCOUNTER — Telehealth: Payer: Self-pay

## 2017-07-06 NOTE — Telephone Encounter (Signed)
Called pt to schedule Medicare Annual Wellness Visit. Pt wants to check with [facility] to make sure they aren't going to come out and do this wellness visit and will call back to schedule. Would like to see PCP on the same day if possible. -nr

## 2017-07-08 ENCOUNTER — Ambulatory Visit: Payer: Medicare Other | Admitting: Emergency Medicine

## 2017-07-23 ENCOUNTER — Inpatient Hospital Stay: Admission: RE | Admit: 2017-07-23 | Payer: Self-pay | Source: Ambulatory Visit

## 2017-07-23 ENCOUNTER — Encounter: Payer: Self-pay | Admitting: Emergency Medicine

## 2017-07-23 ENCOUNTER — Ambulatory Visit (INDEPENDENT_AMBULATORY_CARE_PROVIDER_SITE_OTHER): Payer: Medicare Other

## 2017-07-23 ENCOUNTER — Ambulatory Visit (INDEPENDENT_AMBULATORY_CARE_PROVIDER_SITE_OTHER): Payer: Medicare Other | Admitting: Emergency Medicine

## 2017-07-23 VITALS — BP 143/80 | HR 89 | Temp 98.8°F | Resp 18 | Ht 68.0 in | Wt 168.6 lb

## 2017-07-23 DIAGNOSIS — R0989 Other specified symptoms and signs involving the circulatory and respiratory systems: Secondary | ICD-10-CM

## 2017-07-23 DIAGNOSIS — R062 Wheezing: Secondary | ICD-10-CM

## 2017-07-23 MED ORDER — PREDNISONE 20 MG PO TABS: 40.0000 mg | ORAL_TABLET | Freq: Every day | ORAL | 0 refills | Status: AC

## 2017-07-23 NOTE — Patient Instructions (Addendum)
     IF you received an x-ray today, you will receive an invoice from [facility]. Please contact [facility] at [phone] with questions or concerns regarding your invoice.   IF you received labwork today, you will receive an invoice from [facility]. Please contact [facility] at [phone] with questions or concerns regarding your invoice.   Our billing staff will not be able to assist you with questions regarding bills from these companies.  You will be contacted with the lab results as soon as they are available. The fastest way to get your results is to activate your My Chart account. Instructions are located on the last page of this paperwork. If you have not heard from us regarding the results in 2 weeks, please contact this office.      Acute Bronchitis, Adult Acute bronchitis is when air tubes (bronchi) in the lungs suddenly get swollen. The condition can make it hard to breathe. It can also cause these symptoms:  A cough.  Coughing up clear, yellow, or green mucus.  Wheezing.  Chest congestion.  Shortness of breath.  A fever.  Body aches.  Chills.  A sore throat.  Follow these instructions at home: Medicines  Take over-the-counter and prescription medicines only as told by your doctor.  If you were prescribed an antibiotic medicine, take it as told by your doctor. Do not stop taking the antibiotic even if you start to feel better. General instructions  Rest.  Drink enough fluids to keep your pee (urine) clear or pale yellow.  Avoid smoking and secondhand smoke. If you smoke and you need help quitting, ask your doctor. Quitting will help your lungs heal faster.  Use an inhaler, cool mist vaporizer, or humidifier as told by your doctor.  Keep all follow-up visits as told by your doctor. This is important. How is this prevented? To lower your risk of getting this condition again:  Wash your hands often with soap and water. If you cannot  use soap and water, use hand sanitizer.  Avoid contact with people who have cold symptoms.  Try not to touch your hands to your mouth, nose, or eyes.  Make sure to get the flu shot every year.  Contact a doctor if:  Your symptoms do not get better in 2 weeks. Get help right away if:  You cough up blood.  You have chest pain.  You have very bad shortness of breath.  You become dehydrated.  You faint (pass out) or keep feeling like you are going to pass out.  You keep throwing up (vomiting).  You have a very bad headache.  Your fever or chills gets worse. This information is not intended to replace advice given to you by your health care provider. Make sure you discuss any questions you have with your health care provider. Document Released: 04/26/2008 Document Revised: 06/16/2016 Document Reviewed: 04/28/2016 Elsevier Interactive Patient Education  2017 [employer]  

## 2017-07-23 NOTE — Progress Notes (Signed)
Frank Howell 66 y.o.   Chief Complaint  Patient presents with  . Chest Congestion    pt states congestion has been on and off for some time. Pt states he experiencing shortness of breathe and wheezing. Pt states that he has a hard time working out.    HISTORY OF PRESENT ILLNESS: This is a 66 y.o. male complaining of chest congestion and wheezing on and off x 2-3 weeks. Worse with exertion; denies chest pain; denies SOB, N/V, fever, cough, sputum production, or any other significant symptoms.  HPI   Prior to Admission medications   Medication Sig Start Date End Date Taking? Authorizing Provider  Adalimumab (HUMIRA) 40 MG/0.8ML PSKT Inject 40 mg into the skin every 30 (thirty) days.   Yes [provider]  augmented betamethasone dipropionate (DIPROLENE-AF) 5.27 % ointment APPLICATIONS APPLY ON THE SKIN APPLY TWICE DAILY TO LEGS 03/12/16  Yes [provider]  Budesonide 90 MCG/ACT inhaler Inhale 2 puffs into the lungs 2 (two) times daily. 05/13/17  Yes Frank Agreste, MD  losartan-hydrochlorothiazide (HYZAAR) 100-12.5 MG tablet Take 1 tablet by mouth daily. 05/26/17  Yes Frank Agreste, MD  albuterol (PROVENTIL HFA;VENTOLIN HFA) 108 (90 BASE) MCG/ACT inhaler Inhale 2 puffs into the lungs every 6 (six) hours as needed for wheezing. Patient not taking: Reported on 07/23/2017 08/12/15   Frank Flaming, MD  aspirin EC 81 MG tablet Take 81 mg by mouth every 4 (four) hours as needed for mild pain or moderate pain. Reported on 05/24/2016    [provider]  atenolol (TENORMIN) 50 MG tablet Take 1 tablet (50 mg total) by mouth daily. Patient not taking: Reported on 07/23/2017 09/30/16   Frank Russian, MD  rosuvastatin (CRESTOR) 5 MG tablet TAKE 1 TABLET BY MOUTH EVERY DAY Patient not taking: Reported on 07/23/2017 10/20/16   Frank Agreste, MD  Triamcinolone Acetonide (TRIAMCINOLONE 0.1 % CREAM : EUCERIN) CREA Apply 1 application topically 3 (three) times daily. Apply twice a  day to affected area. Mix is 240 g of triamcinolone with 240 g of Eucerin Patient not taking: Reported on 07/23/2017 09/30/16   Frank Russian, MD    Allergies  Allergen Reactions  . Malarone [Atovaquone-Proguanil Hcl] Rash  . Penicillins     Patient Active Problem List   Diagnosis Date Noted  . Pain in the chest   . Chest pain 08/08/2015  . Psoriasis 08/08/2015  . Asthma 01/10/2012  . Hypertension 01/10/2012  . Hyperlipidemia 01/10/2012  . GERD 01/20/2010  . FECAL OCCULT BLOOD 01/20/2010    Past Medical History:  Diagnosis Date  . Abnormal liver function   . Allergy   . Asthma    per patient has not had asthma attack in five years  . Dermatitis   . GERD (gastroesophageal reflux disease)    patient stated GERD is no longer a problem for him  . Hyperlipidemia   . Hypertension   . Psoriasis     No past surgical history on file.  Social History   Social History  . Marital status: Married    Spouse name: N/A  . Number of children: N/A  . Years of education: N/A   Occupational History  . Not on file.   Social History Main Topics  . Smoking status: Never Smoker  . Smokeless tobacco: Never Used  . Alcohol use 0.0 oz/week     Comment: occasionally  . Drug use: No  . Sexual activity: Not on file  Other Topics Concern  . 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 Asthma Mother   . Hypertension Sister   . Hypertension Brother      Review of Systems  Constitutional: Negative.  Negative for chills and fever.  HENT: Negative.   Eyes: Negative.   Respiratory: Positive for wheezing.        Chest congestion.  Cardiovascular: Negative.  Negative for chest pain, palpitations, orthopnea, claudication, leg swelling and PND.  Gastrointestinal: Negative.  Negative for  abdominal pain, diarrhea, nausea and vomiting.  Genitourinary: Negative.  Negative for dysuria and hematuria.  Musculoskeletal: Negative.  Negative for back pain, myalgias and neck pain.  Skin: Negative.  Negative for rash.  Neurological: Negative.  Negative for dizziness, sensory change, focal weakness and headaches.  Endo/Heme/Allergies: Negative.   All other systems reviewed and are negative.  Vitals:   07/23/17 1318 07/23/17 1338  BP: (!) 149/90 (!) 143/80  Pulse: 89   Resp: 18   Temp: 98.8 F (37.1 C)   SpO2: 99%      Physical Exam  Constitutional: He is oriented to person, place, and time. He appears well-developed and well-nourished.  HENT:  Head: Normocephalic and atraumatic.  Nose: Nose normal.  Mouth/Throat: Oropharynx is clear and moist. No oropharyngeal exudate.  Eyes: Pupils are equal, round, and reactive to light. Conjunctivae and EOM are normal.  Neck: Normal range of motion. Neck supple. No JVD present. No thyromegaly present.  Cardiovascular: Normal rate, regular rhythm, normal heart sounds and intact distal pulses.   Pulmonary/Chest: Effort normal. He has wheezes (intermittent).  Abdominal: Soft. He exhibits no distension. There is no tenderness.  Musculoskeletal: Normal range of motion. He exhibits no edema or tenderness.  Lymphadenopathy:    He has no cervical adenopathy.  Neurological: He is alert and oriented to person, place, and time. No sensory deficit. He exhibits normal muscle tone.  Skin: Skin is warm and dry. Capillary refill takes less than 2 seconds. No rash noted.  Psychiatric: He has a normal mood and affect. His behavior is normal.   Dg Chest 2 View  Result Date: 07/23/2017 CLINICAL DATA:  shortness of breath EXAM: CHEST - 2 VIEW COMPARISON:  03/29/2017 FINDINGS: Lungs are clear. Heart size and mediastinal contours are within normal limits. No effusion. Visualized bones unremarkable. IMPRESSION: No acute cardiopulmonary disease. Electronically  Signed   By: Frank Howell M.D.   On: 07/23/2017 14:28     ASSESSMENT & PLAN: Frank Howell was seen today for chest congestion.  Diagnoses and all orders for this visit:  Chest congestion -     Cancel: DG Chest 2 View; Future -     Cancel: DG Chest 2 View; Future -     Cancel: DG Chest 2 View -     Cancel: DG Chest 2 View -     Cancel: DG Chest 2 View -     Cancel: DG Chest 2 View -     DG Chest 2 View  Wheezing -     predniSONE (DELTASONE) 20 MG tablet; Take 2 tablets (40 mg total) by mouth daily with breakfast. -     Cancel: DG Chest 2 View; Future -     Cancel: DG Chest 2 View -  Cancel: DG Chest 2 View -     Cancel: DG Chest 2 View -     DG Chest 2 View     Patient Instructions       IF you received an x-ray today, you will receive an invoice from [facility]. Please contact [facility] at [phone] with questions or concerns regarding your invoice.   IF you received labwork today, you will receive an invoice from [facility]. Please contact [facility] at [phone] with questions or concerns regarding your invoice.   Our billing staff will not be able to assist you with questions regarding bills from these companies.  You will be contacted with the lab results as soon as they are available. The fastest way to get your results is to activate your My Chart account. Instructions are located on the last page of this paperwork. If you have not heard from [place] regarding the results in 2 weeks, please contact this office.     Acute Bronchitis, Adult Acute bronchitis is when air tubes (bronchi) in the lungs suddenly get swollen. The condition can make it hard to breathe. It can also cause these symptoms:  A cough.  Coughing up clear, yellow, or green mucus.  Wheezing.  Chest congestion.  Shortness of breath.  A fever.  Body aches.  Chills.  A sore throat.  Follow these instructions at home: Medicines  Take over-the-counter and prescription  medicines only as told by your doctor.  If you were prescribed an antibiotic medicine, take it as told by your doctor. Do not stop taking the antibiotic even if you start to feel better. General instructions  Rest.  Drink enough fluids to keep your pee (urine) clear or pale yellow.  Avoid smoking and secondhand smoke. If you smoke and you need help quitting, ask your doctor. Quitting will help your lungs heal faster.  Use an inhaler, cool mist vaporizer, or humidifier as told by your doctor.  Keep all follow-up visits as told by your doctor. This is important. How is this prevented? To lower your risk of getting this condition again:  Wash your hands often with soap and water. If you cannot use soap and water, use hand sanitizer.  Avoid contact with people who have cold symptoms.  Try not to touch your hands to your mouth, nose, or eyes.  Make sure to get the flu shot every year.  Contact a doctor if:  Your symptoms do not get better in 2 weeks. Get help right away if:  You cough up blood.  You have chest pain.  You have very bad shortness of breath.  You become dehydrated.  You faint (pass out) or keep feeling like you are going to pass out.  You keep throwing up (vomiting).  You have a very bad headache.  Your fever or chills gets worse. This information is not intended to replace advice given to you by your health care provider. Make sure you discuss any questions you have with your health care provider. Document Released: 04/26/2008 Document Revised: 06/16/2016 Document Reviewed: 04/28/2016 Elsevier Interactive Patient Education  2017 Elsevier Inc.     Agustina Caroli, MD [facility]

## 2017-09-06 ENCOUNTER — Ambulatory Visit (INDEPENDENT_AMBULATORY_CARE_PROVIDER_SITE_OTHER): Payer: Medicare Other | Admitting: Family Medicine

## 2017-09-06 ENCOUNTER — Encounter: Payer: Self-pay | Admitting: Family Medicine

## 2017-09-06 VITALS — BP 138/90 | HR 77 | Temp 98.4°F | Resp 16 | Ht 68.0 in | Wt 166.2 lb

## 2017-09-06 DIAGNOSIS — Z0000 Encounter for general adult medical examination without abnormal findings: Secondary | ICD-10-CM

## 2017-09-06 DIAGNOSIS — R7989 Other specified abnormal findings of blood chemistry: Secondary | ICD-10-CM | POA: Diagnosis not present

## 2017-09-06 DIAGNOSIS — Z1159 Encounter for screening for other viral diseases: Secondary | ICD-10-CM

## 2017-09-06 DIAGNOSIS — I10 Essential (primary) hypertension: Secondary | ICD-10-CM

## 2017-09-06 DIAGNOSIS — Z23 Encounter for immunization: Secondary | ICD-10-CM

## 2017-09-06 DIAGNOSIS — J4520 Mild intermittent asthma, uncomplicated: Secondary | ICD-10-CM

## 2017-09-06 DIAGNOSIS — E785 Hyperlipidemia, unspecified: Secondary | ICD-10-CM

## 2017-09-06 NOTE — Progress Notes (Signed)
Subjective:  By signing my name below, I, Frank Howell, attest that this documentation has been prepared under the direction and in the presence of Frank Agreste, MD Electronically Signed: Ladene Artist, ED Scribe 09/06/2017 at 10:44 AM.   Patient ID: Frank Howell, male    DOB: 01/09/1951, 66 y.o.   MRN: [id number]  Chief Complaint  Patient presents with  . Annual Exam   HPI Frank Howell is a 66 y.o. male who presents to Primary Care at [facility] for an annual exam/medicare wellness visit. H/o GERD, asthma, HTN, hyperlipidemia, psoriasis.   Psoriasis On Humira injection. Followed by Dr. Sydnee Levans.  Asthma Discussed in June. Prior Intermittent dosing of Advair. Rare use of albuterol. He was transitioned to Pulmicort 2 puffs bid. Pt has not been using albuterol inhaler. Denies wheezing.  Has also not required Pulmicort.  HTN Takes Losartan-HCTZ 100-12.5 mg qd. Does not check his BP outside of the office. Denies using Advil or Aleve. Lab Results  Component Value Date   CREATININE 1.54 (H) 12/02/2016  Did have elevated creatinine 1 year prior at 1.45.    Hyperlipidemia Lab Results  Component Value Date   CHOL 151 12/02/2016   HDL 54 12/02/2016   LDLCALC 80 12/02/2016   TRIG 86 12/02/2016   CHOLHDL 2.8 12/02/2016   Lab Results  Component Value Date   ALT 19 12/02/2016   AST 19 12/02/2016   ALKPHOS 77 12/02/2016   BILITOT 1.2 12/02/2016  Crestor 5 mg qd. Pt has stopped Crestor.   CA Screening Colonoscopy: December 2011 Prostate CA Screening: had a PSA of 2.7 on January 11 Lab Results  Component Value Date   PSA 3.5 10/21/2016   PSA 2.40 04/29/2016   PSA 3.17 12/03/2014   Immunizations Immunization History  Administered Date(s) Administered  . Influenza,inj,Quad PF,6+ Mos 08/09/2015  . Pneumococcal Conjugate-13 04/29/2016  . Pneumococcal Polysaccharide-23 03/27/2012  . Tdap 11/19/2009  Due for repeat pneumovax: declined at this visit; plans to  return Shingles: pt has not had vaccine; expresses concerns since he is taking Humira  Flu: agrees to flu vaccine at this visit Hep C Screening: agrees to screening at this visit   Fall Screening Denies fall within the past year.  Depression Screening Depression screen [facility] 2/9 09/06/2017 07/23/2017 03/29/2017 12/02/2016 10/21/2016  Decreased Interest 0 0 0 0 0  Down, Depressed, Hopeless 0 0 0 0 0  PHQ - 2 Score 0 0 0 0 0   Functional Status Survey: Is the patient deaf or have difficulty hearing?: No Does the patient have difficulty seeing, even when wearing glasses/contacts?: No Does the patient have difficulty concentrating, remembering, or making decisions?: No Does the patient have difficulty walking or climbing stairs?: No Does the patient have difficulty dressing or bathing?: No Does the patient have difficulty doing errands alone such as visiting a doctor's office or shopping?: No    Visual Acuity Screening   Right eye Left eye Both eyes  Without correction: 20/40 20/30-1 20/20  With correction:      Advanced Directives  Pt does have a living will. Plans to bring in a copy.   Vision: last eye exam was 2 weeks ago; next appointment in November  Dentist: has not seen dentist in a while Exercise: 3-4 days/week  Patient Active Problem List   Diagnosis Date Noted  . Pain in the chest   . Chest pain 08/08/2015  . Psoriasis 08/08/2015  . Asthma 01/10/2012  . Hypertension 01/10/2012  . Hyperlipidemia  01/10/2012  . GERD 01/20/2010  . FECAL OCCULT BLOOD 01/20/2010   Past Medical History:  Diagnosis Date  . Abnormal liver function   . Allergy   . Asthma    per patient has not had asthma attack in five years  . Dermatitis   . GERD (gastroesophageal reflux disease)    patient stated GERD is no longer a problem for him  . Hyperlipidemia   . Hypertension   . Psoriasis    No past surgical history on file. Allergies  Allergen Reactions  . Malarone [Atovaquone-Proguanil  Hcl] Rash  . Penicillins    Prior to Admission medications   Medication Sig Start Date End Date Taking? Authorizing Provider  Adalimumab (HUMIRA) 40 MG/0.8ML PSKT Inject 40 mg into the skin every 30 (thirty) days.    [provider]  albuterol (PROVENTIL HFA;VENTOLIN HFA) 108 (90 BASE) MCG/ACT inhaler Inhale 2 puffs into the lungs every 6 (six) hours as needed for wheezing. Patient not taking: Reported on 07/23/2017 08/12/15   Orma Flaming, MD  aspirin EC 81 MG tablet Take 81 mg by mouth every 4 (four) hours as needed for mild pain or moderate pain. Reported on 05/24/2016    [provider]  atenolol (TENORMIN) 50 MG tablet Take 1 tablet (50 mg total) by mouth daily. Patient not taking: Reported on 07/23/2017 09/30/16   Darlyne Russian, MD  Budesonide 90 MCG/ACT inhaler Inhale 2 puffs into the lungs 2 (two) times daily. 05/13/17   Frank Agreste, MD  losartan-hydrochlorothiazide (HYZAAR) 100-12.5 MG tablet Take 1 tablet by mouth daily. 05/26/17   Frank Agreste, MD  rosuvastatin (CRESTOR) 5 MG tablet TAKE 1 TABLET BY MOUTH EVERY DAY Patient not taking: Reported on 07/23/2017 10/20/16   Frank Agreste, MD   Social History   Social History  . Marital status: Married    Spouse name: N/A  . Number of children: N/A  . Years of education: N/A   Occupational History  . Not on file.   Social History Main Topics  . Smoking status: Never Smoker  . Smokeless tobacco: Never Used  . Alcohol use 0.0 oz/week     Comment: occasionally  . Drug use: No  . Sexual activity: Not on file   Other Topics Concern  . 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       Objective:   Physical Exam  Constitutional: He is oriented to person, place, and time. He appears well-developed and  well-nourished.  HENT:  Head: Normocephalic and atraumatic.  Right Ear: External ear normal.  Left Ear: External ear normal.  Mouth/Throat: Oropharynx is clear and moist.  Eyes: Pupils are equal, round, and reactive to light. Conjunctivae and EOM are normal.  Neck: Normal range of motion. Neck supple. No thyromegaly present.  Cardiovascular: Normal rate, regular rhythm, normal heart sounds and intact distal pulses.   Pulmonary/Chest: Effort normal and breath sounds normal. No respiratory distress. He has no wheezes.  Abdominal: Soft. He exhibits no distension. There is no tenderness.  Musculoskeletal: Normal range of motion. He exhibits no edema or tenderness.  Lymphadenopathy:    He has no cervical adenopathy.  Neurological: He is alert and oriented to person, place, and time. He has normal  reflexes.  Skin: Skin is warm and dry.  Psychiatric: He has a normal mood and affect. His behavior is normal.  Vitals reviewed.    Vitals:   09/06/17 1003  BP: 138/90  Pulse: 77  Resp: 16  Temp: 98.4 F (36.9 C)  TempSrc: Oral  SpO2: 99%  Weight: 166 lb 3.2 oz (75.4 kg)  Height: 5\' 8"  (1.727 m)      Assessment & Plan:  Lamario Mani is a 66 y.o. male Medicare annual wellness visit, subsequent  -  - anticipatory guidance as below in AVS, screening labs if needed. Health maintenance items as above in HPI discussed/recommended as applicable.   - no concerning responses on depression, fall, or functional status screening. Any positive responses noted as above. Advanced directives discussed as in CHL.   Need for influenza vaccination - Plan: Flu Vaccine QUAD 6+ mos PF IM (Fluarix Quad PF)  Need for hepatitis C screening test - Plan: Hepatitis C antibody  Mild intermittent asthma without complication  - Overall doing well currently. Okay to continue albuterol as needed. If more persistent requirement, restart Pulmicort daily.  Essential hypertension - Plan: Comprehensive metabolic panel  -  Stable overall, but goal of less than 130/80 discussed. Monitor outside readings and if remaining over 130/80, return to discuss changes, continue same dose of Hyzaar for now. Labs pending  Elevated serum creatinine - Plan: Comprehensive metabolic panel  - Suspect some chronic kidney disease, check CMP to determine if nephrology evaluation needed.  Hyperlipidemia, unspecified hyperlipidemia type - Plan: Lipid panel  - Check lipid panel, may need to restart Crestor, but can be decided once labs returned  No orders of the defined types were placed in this encounter.  Patient Instructions   Keep a record of your blood pressures outside of the office and if running over 130/80, return to discuss changes in blood pressure medicine.   Kidney function test has been elevated, likely have some chronic kidney disease. Depending on that level may need to see nephrologist. I will let you know.   I will check cholesterol, may need to restart Crestor.   If you need albuterol more than once per week, then restart pulmicort every day for asthma.   You are due for Pneumovax.  Return whenever you would like to have that updated.   You likely can receive the "Shingrix" shingles vaccine while taking Humira.  You can check with your dermatologist to make sure it is ok. If so, let me know and I will send it to your pharmacy.    Keeping you healthy  Get these tests  Blood pressure- Have your blood pressure checked once a year by your healthcare provider.  Normal blood pressure is 120/80  Weight- Have your body mass index (BMI) calculated to screen for obesity.  BMI is a measure of body fat based on height and weight. You can also calculate your own BMI at ViewBanking.si.  Cholesterol- Have your cholesterol checked every year.  Diabetes- Have your blood sugar checked regularly if you have high blood pressure, high cholesterol, have a family history of diabetes or if you are  overweight.  Screening for Colon Cancer- Colonoscopy starting at age 67.  Screening may begin sooner depending on your family history and other health conditions. Follow up colonoscopy as directed by your Gastroenterologist.  Screening for Prostate Cancer- Both blood work (PSA) and a rectal exam help screen for Prostate Cancer.  Screening begins at age 71 with African-American men and at  age 22 with Caucasian men.  Screening may begin sooner depending on your family history.  Take these medicines  Aspirin- One aspirin daily can help prevent Heart disease and Stroke.  Flu shot- Every fall.  Tetanus- Every 10 years.  Zostavax- Once after the age of 50 to prevent Shingles.  Pneumonia shot- Once after the age of 18; if you are younger than 78, ask your healthcare provider if you need a Pneumonia shot.  Take these steps  Don't smoke- If you do smoke, talk to your doctor about quitting.  For tips on how to quit, go to www.smokefree.gov or call 1-800-QUIT-NOW.  Be physically active- Exercise 5 days a week for at least 30 minutes.  If you are not already physically active start slow and gradually work up to 30 minutes of moderate physical activity.  Examples of moderate activity include walking briskly, mowing the yard, dancing, swimming, bicycling, etc.  Eat a healthy diet- Eat a variety of healthy food such as fruits, vegetables, low fat milk, low fat cheese, yogurt, lean meant, poultry, fish, beans, tofu, etc. For more information go to www.thenutritionsource.org  Drink alcohol in moderation- Limit alcohol intake to less than two drinks a day. Never drink and drive.  Dentist- Brush and floss twice daily; visit your dentist twice a year.  Depression- Your emotional health is as important as your physical health. If you're feeling down, or losing interest in things you would normally enjoy please talk to your healthcare provider.  Eye exam- Visit your eye doctor every year.  Safe sex- If  you may be exposed to a sexually transmitted infection, use a condom.  Seat belts- Seat belts can save your life; always wear one.  Smoke/Carbon Monoxide detectors- These detectors need to be installed on the appropriate level of your home.  Replace batteries at least once a year.  Skin cancer- When out in the sun, cover up and use sunscreen 15 SPF or higher.  Violence- If anyone is threatening you, please tell your healthcare provider.  Living Will/ Health care power of attorney- Speak with your healthcare provider and family.  IF you received an x-ray today, you will receive an invoice from [facility]. Please contact [facility] at [phone] with questions or concerns regarding your invoice.   IF you received labwork today, you will receive an invoice from [facility]. Please contact [facility] at [phone] with questions or concerns regarding your invoice.   Our billing staff will not be able to assist you with questions regarding bills from these companies.  You will be contacted with the lab results as soon as they are available. The fastest way to get your results is to activate your My Chart account. Instructions are located on the last page of this paperwork. If you have not heard from [place] regarding the results in 2 weeks, please contact this office.       I personally performed the services described in this documentation, which was scribed in my presence. The recorded information has been reviewed and considered for accuracy and completeness, addended by me as needed, and agree with information above.  Signed,   [name], MD [facility] at [place].  09/09/17 5:38 PM

## 2017-09-06 NOTE — Patient Instructions (Addendum)
Keep a record of your blood pressures outside of the office and if running over 130/80, return to discuss changes in blood pressure medicine.   Kidney function test has been elevated, likely have some chronic kidney disease. Depending on that level may need to see nephrologist. I will let you know.   I will check cholesterol, may need to restart Crestor.   If you need albuterol more than once per week, then restart pulmicort every day for asthma.   You are due for Pneumovax.  Return whenever you would like to have that updated.   You likely can receive the "Shingrix" shingles vaccine while taking Humira.  You can check with your dermatologist to make sure it is ok. If so, let me know and I will send it to your pharmacy.    Keeping you healthy  Get these tests  Blood pressure- Have your blood pressure checked once a year by your healthcare provider.  Normal blood pressure is 120/80  Weight- Have your body mass index (BMI) calculated to screen for obesity.  BMI is a measure of body fat based on height and weight. You can also calculate your own BMI at [email or web address].  Cholesterol- Have your cholesterol checked every year.  Diabetes- Have your blood sugar checked regularly if you have high blood pressure, high cholesterol, have a family history of diabetes or if you are overweight.  Screening for Colon Cancer- Colonoscopy starting at age 41.  Screening may begin sooner depending on your family history and other health conditions. Follow up colonoscopy as directed by your Gastroenterologist.  Screening for Prostate Cancer- Both blood work (PSA) and a rectal exam help screen for Prostate Cancer.  Screening begins at age 60 with African-American men and at age 33 with Caucasian men.  Screening may begin sooner depending on your family history.  Take these medicines  Aspirin- One aspirin daily can help prevent Heart disease and Stroke.  Flu shot- Every fall.  Tetanus- Every 10  years.  Zostavax- Once after the age of 4 to prevent Shingles.  Pneumonia shot- Once after the age of 34; if you are younger than 17, ask your healthcare provider if you need a Pneumonia shot.  Take these steps  Don't smoke- If you do smoke, talk to your doctor about quitting.  For tips on how to quit, go to www.smokefree.gov or call 1-800-QUIT-NOW.  Be physically active- Exercise 5 days a week for at least 30 minutes.  If you are not already physically active start slow and gradually work up to 30 minutes of moderate physical activity.  Examples of moderate activity include walking briskly, mowing the yard, dancing, swimming, bicycling, etc.  Eat a healthy diet- Eat a variety of healthy food such as fruits, vegetables, low fat milk, low fat cheese, yogurt, lean meant, poultry, fish, beans, tofu, etc. For more information go to www.thenutritionsource.org  Drink alcohol in moderation- Limit alcohol intake to less than two drinks a day. Never drink and drive.  Dentist- Brush and floss twice daily; visit your dentist twice a year.  Depression- Your emotional health is as important as your physical health. If you're feeling down, or losing interest in things you would normally enjoy please talk to your healthcare provider.  Eye exam- Visit your eye doctor every year.  Safe sex- If you may be exposed to a sexually transmitted infection, use a condom.  Seat belts- Seat belts can save your life; always wear one.  Smoke/Carbon Monoxide detectors- These detectors need to  be installed on the appropriate level of your home.  Replace batteries at least once a year.  Skin cancer- When out in the sun, cover up and use sunscreen 15 SPF or higher.  Violence- If anyone is threatening you, please tell your healthcare provider.  Living Will/ Health care power of attorney- Speak with your healthcare provider and family.  IF you received an x-ray today, you will receive an invoice from [facility]. Please contact [facility] at [phone] with questions or concerns regarding your invoice.   IF you received labwork today, you will receive an invoice from [place]. Please contact [facility] at [phone] with questions or concerns regarding your invoice.   Our billing staff will not be able to assist you with questions regarding bills from these companies.  You will be contacted with the lab results as soon as they are available. The fastest way to get your results is to activate your My Chart account. Instructions are located on the last page of this paperwork. If you have not heard from [place] regarding the results in 2 weeks, please contact this office.

## 2017-09-07 LAB — COMPREHENSIVE METABOLIC PANEL
A/G RATIO: 1.4 (ref 1.2–2.2)
ALK PHOS: 69 IU/L (ref 39–117)
ALT: 13 IU/L (ref 0–44)
AST: 17 IU/L (ref 0–40)
Albumin: 4.2 g/dL (ref 3.6–4.8)
BILIRUBIN TOTAL: 1.4 mg/dL — AB (ref 0.0–1.2)
BUN / CREAT RATIO: 11 (ref 10–24)
BUN: 17 mg/dL (ref 8–27)
CALCIUM: 9.5 mg/dL (ref 8.6–10.2)
CHLORIDE: 102 mmol/L (ref 96–106)
CO2: 26 mmol/L (ref 20–29)
Creatinine, Ser: 1.6 mg/dL — ABNORMAL HIGH (ref 0.76–1.27)
GFR calc Af Amer: 50 mL/min/{1.73_m2} — ABNORMAL LOW (ref >59)
GFR calc non Af Amer: 44 mL/min/{1.73_m2} — ABNORMAL LOW (ref >59)
GLUCOSE: 96 mg/dL (ref 65–99)
Globulin, Total: 3.1 g/dL (ref 1.5–4.5)
Potassium: 3.6 mmol/L (ref 3.5–5.2)
SODIUM: 142 mmol/L (ref 134–144)
TOTAL PROTEIN: 7.3 g/dL (ref 6.0–8.5)

## 2017-09-07 LAB — LIPID PANEL
Chol/HDL Ratio: 3.4 ratio (ref 0.0–5.0)
Cholesterol, Total: 201 mg/dL — ABNORMAL HIGH (ref 100–199)
HDL: 60 mg/dL (ref >39)
LDL Calculated: 127 mg/dL — ABNORMAL HIGH (ref 0–99)
TRIGLYCERIDES: 71 mg/dL (ref 0–149)
VLDL CHOLESTEROL CAL: 14 mg/dL (ref 5–40)

## 2017-09-07 LAB — HEPATITIS C ANTIBODY

## 2017-09-19 ENCOUNTER — Encounter: Payer: Self-pay | Admitting: Family Medicine

## 2017-12-19 ENCOUNTER — Ambulatory Visit: Payer: Medicare Other | Admitting: Family Medicine

## 2017-12-19 ENCOUNTER — Other Ambulatory Visit: Payer: Self-pay

## 2017-12-19 ENCOUNTER — Encounter: Payer: Self-pay | Admitting: Family Medicine

## 2017-12-19 VITALS — BP 140/82 | HR 88 | Temp 98.6°F | Resp 18 | Ht 68.0 in | Wt 164.6 lb

## 2017-12-19 DIAGNOSIS — I10 Essential (primary) hypertension: Secondary | ICD-10-CM | POA: Diagnosis not present

## 2017-12-19 DIAGNOSIS — J4520 Mild intermittent asthma, uncomplicated: Secondary | ICD-10-CM

## 2017-12-19 MED ORDER — ALBUTEROL SULFATE HFA 108 (90 BASE) MCG/ACT IN AERS: 1.0000 | INHALATION_SPRAY | RESPIRATORY_TRACT | 0 refills | Status: DC | PRN

## 2017-12-19 MED ORDER — FLUTICASONE-SALMETEROL 100-50 MCG/DOSE IN AEPB: 1.0000 | INHALATION_SPRAY | Freq: Two times a day (BID) | RESPIRATORY_TRACT | 2 refills | Status: DC

## 2017-12-19 NOTE — Patient Instructions (Addendum)
It appears that your machine is reading too high. No change in meds for now. If new machine is running high, we can check that one as well to see if med changes are needed. Monitor for next month and follow up to decide on possible changes.    Asthma control is decreased currently likely due to being off of the control medicine Pulmicort for the past month. Make sure you continue your baseline control medicine for asthma in the future, with albuterol as needed for breakthrough symptoms. We can restart Advair as the control medicine for now, with albuterol as needed up to every 4 hours if wheezing or shortness of breath. Recheck in 1 month to decide on plan from there. Likely you will not need to be on that strong of a medicine long-term.  Return to the clinic or go to the nearest emergency room if any of your symptoms worsen or new symptoms occur.   How to Use a Dry Powder Inhaler A dry powder inhaler (DPI) is a device for taking medicine that must be breathed into the lungs (inhaled). The device is also called a disk inhaler. Your doctor will tell you how often to use your inhaler. What are the risks? If you do not use your inhaler correctly, medicine might not get to your lungs to help you breathe. Inhaler medicine can cause side effects, such as:  Mouth infection.  Throat infection.  Cough.  A voice that sounds rough (hoarseness).  Headache.  A feeling that you are going to throw up (nausea).  Throwing up (vomiting).  Lung infection (pneumonia). This can happen if you have chronic obstructive lung (pulmonary) disease (COPD).  Supplies needed:  An inhaler. The medicine that you will need is already in the inhaler. How to use a dry powder inhaler 1. Remove any gum or candy from your mouth. 2. Stand or sit up straight. 3. Hold the inhaler in one hand. 4. Put the thumb of your other hand in the thumb grip of the inhaler. Next, push your thumb away from you as far as it will  go. 5. Hold the inhaler flat, like a hamburger, with the mouthpiece facing you. 6. Use your other hand to slide the lever that is near the mouthpiece away from you until you hear a click. 7. Turn your head and breathe out. Do not breathe out into the mouthpiece. 8. Turn your head back to the mouthpiece and seal your lips around it. 9. Take a deep breath through your mouth. Do not breathe through your nose. 10. Hold your breath for 10 seconds. 11. Remove the inhaler from your mouth, turn your head, and breathe out slowly. 12. Close the inhaler. To do this, slide the thumb grip back to the closed position. 13. Check the number on the inhaler that shows how much medicine (how many doses) the inhaler has in it. The number should go down by one each time you use the inhaler. 14. Rinse your mouth with water. General recommendations  Do not drop or shake your inhaler.  Do not wash your inhaler. If the mouthpiece gets dirty, use a dry cloth to wipe it clean.  Do not breathe into the inhaler.  Store your inhaler in a dry place at room temperature. Do not store it in your bathroom.  Keep track of your doses: ? When the dose number turns red, you have five doses left. That means it is time to pick up a new inhaler at your pharmacy. ?  When the dose number shows zero (0), throw away the inhaler. Follow these instructions at home:  Take your inhaled medicine only as told by your doctor.  Keep all follow-up visits as told by your doctor. This is important. Contact a health care provider if:  You have a sore mouth.  You have a sore throat.  You have a lasting (persistent) cough.  Your voice changes.  You have a fever.  You have any of these problems after starting to use your inhaler: ? You get headaches often. ? You often have a feeling that you are going to throw up. ? You throw up often.  Your asthma or COPD symptoms have not improved after you have been using your inhaler for a few  weeks. Get help right away if:  You have very bad shortness of breath.  You have trouble breathing. Summary  A dry powder inhaler (DPI) is a device that has medicine in it that should help you breathe better.  Follow instructions from your doctor about using your inhaler.  Your doctor will prescribe the strength of the medicine and how often you need to inhale it.  Keep your inhaler dry. The medicine in the inhaler must be kept dry.  If you have any questions about how to use your inhaler, talk with your doctor or pharmacist. This information is not intended to replace advice given to you by your health care provider. Make sure you discuss any questions you have with your health care provider. Document Released: 08/17/2008 Document Revised: 08/02/2016 Document Reviewed: 08/02/2016 Elsevier Interactive Patient Education  2017 [employer].  How to Use a Metered Dose Inhaler A metered dose inhaler is a handheld device for taking medicine that must be breathed into the lungs (inhaled). The device can be used to deliver a variety of inhaled medicines, including:  Quick relief or rescue medicines, such as bronchodilators.  Controller medicines, such as corticosteroids.  The medicine is delivered by pushing down on a metal canister to release a preset amount of spray and medicine. Each device contains the amount of medicine that is needed for a preset number of uses (inhalations). Your health care provider may recommend that you use a spacer with your inhaler to help you take the medicine more effectively. A spacer is a plastic tube with a mouthpiece on one end and an opening that connects to the inhaler on the other end. A spacer holds the medicine in a tube for a short time, which allows you to inhale more medicine. What are the risks? If you do not use your inhaler correctly, medicine might not reach your lungs to help you breathe. Inhaler medicine can cause side effects, such  as:  Mouth or throat infection.  Cough.  Hoarseness.  Headache.  Nausea and vomiting.  Lung infection (pneumonia) in people who have a lung condition called COPD.  How to use a metered dose inhaler without a spacer 15. Remove the cap from the inhaler. 16. If you are using the inhaler for the first time, shake it for 5 seconds, turn it away from your face, then release 4 puffs into the air. This is called priming. 17. Shake the inhaler for 5 seconds. 18. Position the inhaler so the top of the canister faces up. 19. Put your index finger on the top of the medicine canister. Support the bottom of the inhaler with your thumb. 20. Breathe out normally and as completely as possible, away from the inhaler. 21. Either place  the inhaler between your teeth and close your lips tightly around the mouthpiece, or hold the inhaler 1-2 inches (2.5-5 cm) away from your open mouth. Keep your tongue down out of the way. If you are unsure which technique to use, ask your health care provider. 22. Press the canister down with your index finger to release the medicine, then inhale deeply and slowly through your mouth (not your nose) until your lungs are completely filled. Inhaling should take 4-6 seconds. 23. Hold the medicine in your lungs for 5-10 seconds (10 seconds is best). This helps the medicine get into the small airways of your lungs. 24. With your lips in a tight circle (pursed), breathe out slowly. 25. Repeat steps 3-10 until you have taken the number of puffs that your health care provider directed. Wait about 1 minute between puffs or as directed. 26. Put the cap on the inhaler. 27. If you are using a steroid inhaler, rinse your mouth with water, gargle, and spit out the water. Do not swallow the water. How to use a metered dose inhaler with a spacer 1. Remove the cap from the inhaler. 2. If you are using the inhaler for the first time, shake it for 5 seconds, turn it away from your face, then  release 4 puffs into the air. This is called priming. 3. Shake the inhaler for 5 seconds. 4. Place the open end of the spacer onto the inhaler mouthpiece. 5. Position the inhaler so the top of the canister faces up and the spacer mouthpiece faces you. 6. Put your index finger on the top of the medicine canister. Support the bottom of the inhaler and the spacer with your thumb. 7. Breathe out normally and as completely as possible, away from the spacer. 8. Place the spacer between your teeth and close your lips tightly around it. Keep your tongue down out of the way. 9. Press the canister down with your index finger to release the medicine, then inhale deeply and slowly through your mouth (not your nose) until your lungs are completely filled. Inhaling should take 4-6 seconds. 10. Hold the medicine in your lungs for 5-10 seconds (10 seconds is best). This helps the medicine get into the small airways of your lungs. 11. With your lips in a tight circle (pursed), breathe out slowly. 12. Repeat steps 3-11 until you have taken the number of puffs that your health care provider directed. Wait about 1 minute between puffs or as directed. 13. Remove the spacer from the inhaler and put the cap on the inhaler. 14. If you are using a steroid inhaler, rinse your mouth with water, gargle, and spit out the water. Do not swallow the water. Follow these instructions at home:  Take your inhaled medicine only as told by your health care provider. Do not use the inhaler more than directed by your health care provider.  Keep all follow-up visits as told by your health care provider. This is important.  If your inhaler has a counter, you can check it to determine how full your inhaler is. If your inhaler does not have a counter, ask your health care provider when you will need to refill your inhaler and write the refill date on a calendar or on your inhaler canister. Note that you cannot know when an inhaler is empty  by shaking it.  Follow directions on the package insert for care and cleaning of your inhaler and spacer. Contact a health care provider if:  Symptoms are  only partially relieved with your inhaler.  You are having trouble using your inhaler.  You have an increase in phlegm.  You have headaches. Get help right away if:  You feel little or no relief after using your inhaler.  You have dizziness.  You have a fast heart rate.  You have chills or a fever.  You have night sweats.  There is blood in your phlegm. Summary  A metered dose inhaler is a handheld device for taking medicine that must be breathed into the lungs (inhaled).  The medicine is delivered by pushing down on a metal canister to release a preset amount of spray and medicine.  Each device contains the amount of medicine that is needed for a preset number of uses (inhalations). This information is not intended to replace advice given to you by your health care provider. Make sure you discuss any questions you have with your health care provider. Document Released: 11/08/2005 Document Revised: 09/28/2016 Document Reviewed: 09/28/2016 Elsevier Interactive Patient Education  2017 [employer]    Hypertension Hypertension, commonly called high blood pressure, is when the force of blood pumping through the arteries is too strong. The arteries are the blood vessels that carry blood from the heart throughout the body. Hypertension forces the heart to work harder to pump blood and may cause arteries to become narrow or stiff. Having untreated or uncontrolled hypertension can cause heart attacks, strokes, kidney disease, and other problems. A blood pressure reading consists of a higher number over a lower number. Ideally, your blood pressure should be below 120/80. The first ("top") number is called the systolic pressure. It is a measure of the pressure in your arteries as your heart beats. The second ("bottom") number is  called the diastolic pressure. It is a measure of the pressure in your arteries as the heart relaxes. What are the causes? The cause of this condition is not known. What increases the risk? Some risk factors for high blood pressure are under your control. Others are not. Factors you can change  Smoking.  Having type 2 diabetes mellitus, high cholesterol, or both.  Not getting enough exercise or physical activity.  Being overweight.  Having too much fat, sugar, calories, or salt (sodium) in your diet.  Drinking too much alcohol. Factors that are difficult or impossible to change  Having chronic kidney disease.  Having a family history of high blood pressure.  Age. Risk increases with age.  Race. You may be at higher risk if you are African-American.  Gender. Men are at higher risk than women before age 20. After age 50, women are at higher risk than men.  Having obstructive sleep apnea.  Stress. What are the signs or symptoms? Extremely high blood pressure (hypertensive crisis) may cause:  Headache.  Anxiety.  Shortness of breath.  Nosebleed.  Nausea and vomiting.  Severe chest pain.  Jerky movements you cannot control (seizures).  How is this diagnosed? This condition is diagnosed by measuring your blood pressure while you are seated, with your arm resting on a surface. The cuff of the blood pressure monitor will be placed directly against the skin of your upper arm at the level of your heart. It should be measured at least twice using the same arm. Certain conditions can cause a difference in blood pressure between your right and left arms. Certain factors can cause blood pressure readings to be lower or higher than normal (elevated) for a short period of time:  When your blood  pressure is higher when you are in a health care provider's office than when you are at home, this is called white coat hypertension. Most people with this condition do not need  medicines.  When your blood pressure is higher at home than when you are in a health care provider's office, this is called masked hypertension. Most people with this condition may need medicines to control blood pressure.  If you have a high blood pressure reading during one visit or you have normal blood pressure with other risk factors:  You may be asked to return on a different day to have your blood pressure checked again.  You may be asked to monitor your blood pressure at home for 1 week or longer.  If you are diagnosed with hypertension, you may have other blood or imaging tests to help your health care provider understand your overall risk for other conditions. How is this treated? This condition is treated by making healthy lifestyle changes, such as eating healthy foods, exercising more, and reducing your alcohol intake. Your health care provider may prescribe medicine if lifestyle changes are not enough to get your blood pressure under control, and if:  Your systolic blood pressure is above 130.  Your diastolic blood pressure is above 80.  Your personal target blood pressure may vary depending on your medical conditions, your age, and other factors. Follow these instructions at home: Eating and drinking  Eat a diet that is high in fiber and potassium, and low in sodium, added sugar, and fat. An example eating plan is called the DASH (Dietary Approaches to Stop Hypertension) diet. To eat this way: ? Eat plenty of fresh fruits and vegetables. Try to fill half of your plate at each meal with fruits and vegetables. ? Eat whole grains, such as whole wheat pasta, brown rice, or whole grain bread. Fill about one quarter of your plate with whole grains. ? Eat or drink low-fat dairy products, such as skim milk or low-fat yogurt. ? Avoid fatty cuts of meat, processed or cured meats, and poultry with skin. Fill about one quarter of your plate with lean proteins, such as fish, chicken  without skin, beans, eggs, and tofu. ? Avoid premade and processed foods. These tend to be higher in sodium, added sugar, and fat.  Reduce your daily sodium intake. Most people with hypertension should eat less than 1,500 mg of sodium a day.  Limit alcohol intake to no more than 1 drink a day for nonpregnant women and 2 drinks a day for men. One drink equals 12 oz of beer, 5 oz of wine, or 1 oz of hard liquor. Lifestyle  Work with your health care provider to maintain a healthy body weight or to lose weight. Ask what an ideal weight is for you.  Get at least 30 minutes of exercise that causes your heart to beat faster (aerobic exercise) most days of the week. Activities may include walking, swimming, or biking.  Include exercise to strengthen your muscles (resistance exercise), such as pilates or lifting weights, as part of your weekly exercise routine. Try to do these types of exercises for 30 minutes at least 3 days a week.  Do not use any products that contain nicotine or tobacco, such as cigarettes and e-cigarettes. If you need help quitting, ask your health care provider.  Monitor your blood pressure at home as told by your health care provider.  Keep all follow-up visits as told by your health care provider. This is  important. Medicines  Take over-the-counter and prescription medicines only as told by your health care provider. Follow directions carefully. Blood pressure medicines must be taken as prescribed.  Do not skip doses of blood pressure medicine. Doing this puts you at risk for problems and can make the medicine less effective.  Ask your health care provider about side effects or reactions to medicines that you should watch for. Contact a health care provider if:  You think you are having a reaction to a medicine you are taking.  You have headaches that keep coming back (recurring).  You feel dizzy.  You have swelling in your ankles.  You have trouble with your  vision. Get help right away if:  You develop a severe headache or confusion.  You have unusual weakness or numbness.  You feel faint.  You have severe pain in your chest or abdomen.  You vomit repeatedly.  You have trouble breathing. Summary  Hypertension is when the force of blood pumping through your arteries is too strong. If this condition is not controlled, it may put you at risk for serious complications.  Your personal target blood pressure may vary depending on your medical conditions, your age, and other factors. For most people, a normal blood pressure is less than 120/80.  Hypertension is treated with lifestyle changes, medicines, or a combination of both. Lifestyle changes include weight loss, eating a healthy, low-sodium diet, exercising more, and limiting alcohol. This information is not intended to replace advice given to you by your health care provider. Make sure you discuss any questions you have with your health care provider. Document Released: 11/08/2005 Document Revised: 10/06/2016 Document Reviewed: 10/06/2016 Elsevier Interactive Patient Education  2018 [employer].   IF you received an x-ray today, you will receive an invoice from [facility]. Please contact [facility] at [phone] with questions or concerns regarding your invoice.   IF you received labwork today, you will receive an invoice from [place]. Please contact [facility] at [phone] with questions or concerns regarding your invoice.   Our billing staff will not be able to assist you with questions regarding bills from these companies.  You will be contacted with the lab results as soon as they are available. The fastest way to get your results is to activate your My Chart account. Instructions are located on the last page of this paperwork. If you have not heard from [place] regarding the results in 2 weeks, please contact this office.

## 2017-12-19 NOTE — Progress Notes (Signed)
Subjective:  By signing my name below, I, [name], attest that this documentation has been prepared under the direction and in the presence of [name], MD Electronically Signed: Ladene Artist, ED Scribe 12/19/2017 at 5:29 PM.   Patient ID: Frank Howell, male    DOB: November 04, 1951, 67 y.o.   MRN: [id number]  Chief Complaint  Patient presents with  . Asthma    wants to go back to old medication   . Hypertension   HPI Frank Howell is a 67 y.o. male who presents to Primary Care at [facility] for f/u.  HTN with CKD BP Readings from Last 3 Encounters:  12/19/17 140/82  09/06/17 138/90  07/23/17 (!) 143/80   Lab Results  Component Value Date   CREATININE 1.60 (H) 09/06/2017  Continued on same regimen at last visit in Oct. Continued on Hyzaaar 100-12.5 mg qd. Overall stable with previous reading of 1.54 in 11/2016.  Pt states that he checked his BP this morning with a reading of 150/101. His triage BP was 140/82, however his machine read 157/104 when he rechecked it during triage. Denies cp, sob, lightheadedness, dizziness.  Asthma Was doing well in Oct. Previously transition from Advair to Pulmicort due to intermittent dosing of Advair. He had not required albuterol or Pulmicort at last visit. Continued albuterol prn with option to restart Pulmicort if increased use. -- Pt states that he does not like Pulmicort as it is not working for him. He had been using Pulmicort one-two puffs/day, instead of the prescribed 2 puffs bid, however, pt states that he ran out of Pulmicort 1 month ago. Pt has noticed some wheezing at night. He states that he does not recall the last time he used his albuterol inhaler. Denies waking up sob, fever.  Hyperlipidemia Lab Results  Component Value Date   CHOL 201 (H) 09/06/2017   HDL 60 09/06/2017   LDLCALC 127 (H) 09/06/2017   TRIG 71 09/06/2017   CHOLHDL 3.4 09/06/2017   Lab Results  Component Value Date   ALT 13 09/06/2017   AST 17 09/06/2017    ALKPHOS 69 09/06/2017   BILITOT 1.4 (H) 09/06/2017  Pt had stopped Crestor and was trying oatmeal and flaxseed. Planned to restart Crestor at 5 mg based on last readings.  Patient Active Problem List   Diagnosis Date Noted  . Pain in the chest   . Chest pain 08/08/2015  . Psoriasis 08/08/2015  . Asthma 01/10/2012  . Hypertension 01/10/2012  . Hyperlipidemia 01/10/2012  . GERD 01/20/2010  . FECAL OCCULT BLOOD 01/20/2010   Past Medical History:  Diagnosis Date  . Abnormal liver function   . Allergy   . Asthma    per patient has not had asthma attack in five years  . Dermatitis   . GERD (gastroesophageal reflux disease)    patient stated GERD is no longer a problem for him  . Hyperlipidemia   . Hypertension   . Psoriasis    No past surgical history on file. Allergies  Allergen Reactions  . Malarone [Atovaquone-Proguanil Hcl] Rash  . Penicillins    Prior to Admission medications   Medication Sig Start Date End Date Taking? Authorizing Provider  Adalimumab (HUMIRA) 40 MG/0.8ML PSKT Inject 40 mg into the skin every 30 (thirty) days.    [provider]  albuterol (PROVENTIL HFA;VENTOLIN HFA) 108 (90 BASE) MCG/ACT inhaler Inhale 2 puffs into the lungs every 6 (six) hours as needed for wheezing. Patient not taking: Reported on 07/23/2017  08/12/15   Orma Flaming, MD  aspirin EC 81 MG tablet Take 81 mg by mouth every 4 (four) hours as needed for mild pain or moderate pain. Reported on 05/24/2016    [provider]  Budesonide 90 MCG/ACT inhaler Inhale 2 puffs into the lungs 2 (two) times daily. Patient not taking: Reported on 09/06/2017 05/13/17   [name], MD  losartan-hydrochlorothiazide [facility]) 100-12.5 MG tablet Take 1 tablet by mouth daily. 05/26/17   [name], MD   Social History   Socioeconomic History  . Marital status: Married    Spouse name: Not on file  . Number of children: Not on file  . Years of education: Not on file  . Highest  education level: Not on file  Social Needs  . Financial resource strain: Not on file  . Food insecurity - worry: Not on file  . Food insecurity - inability: Not on file  . Transportation needs - medical: Not on file  . Transportation needs - non-medical: Not on file  Occupational History  . Not on file  Tobacco Use  . Smoking status: Never Smoker  . Smokeless tobacco: Never Used  Substance and Sexual Activity  . Alcohol use: Yes    Alcohol/week: 0.0 oz    Comment: occasionally  . Drug use: No  . Sexual activity: Not on file  Other Topics Concern  . 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  Eyes: Negative for visual disturbance.  Respiratory: Positive for wheezing (at night). Negative for cough, chest tightness and shortness of breath.   Cardiovascular: Negative for chest pain, palpitations and leg swelling.  Gastrointestinal: Negative for abdominal pain and blood in stool.  Neurological: Negative for dizziness, light-headedness and headaches.      Objective:   Physical Exam  Constitutional: He is oriented to person, place, and time. He appears well-developed and well-nourished.  HENT:  Head: Normocephalic and atraumatic.  Right Ear: Tympanic membrane, external ear and ear canal normal.  Left Ear: Tympanic membrane, external ear and ear canal normal.  Nose: No rhinorrhea.  Mouth/Throat: Oropharynx is clear and moist and mucous membranes are normal. No oropharyngeal exudate or posterior oropharyngeal erythema.  Eyes: Conjunctivae and EOM are normal. Pupils are equal, round, and reactive to light.  Neck: Neck supple. No JVD present. Carotid bruit is not present.  Cardiovascular: Normal rate, regular rhythm, normal heart sounds and intact distal  pulses.  No murmur heard. Pulmonary/Chest: Effort normal. He has wheezes. He has no rhonchi. He has no rales.  Expiratory wheeze.  Abdominal: Soft. There is no tenderness.  Musculoskeletal: He exhibits no edema.  Lymphadenopathy:    He has no cervical adenopathy.  Neurological: He is alert and oriented to person, place, and time.  Skin: Skin is warm and dry. No rash noted.  Psychiatric: He has a normal mood and affect. His behavior is normal.  Vitals reviewed.  Vitals:   12/19/17 1656  BP: 140/82  Pulse: 88  Resp: 18  Temp: 98.6 F (37 C)  TempSrc: Oral  SpO2: 99%  Weight: 164 lb 9.6 oz (74.7 kg)  Height: 5\' 8"  (1.727 m)      Assessment & Plan:    Frank Howell is  a 67 y.o. male Mild intermittent asthma, unspecified whether complicated - Plan: Fluticasone-Salmeterol (ADVAIR DISKUS) 100-50 MCG/DOSE AEPB, albuterol (PROVENTIL HFA;VENTOLIN HFA) 108 (90 Base) MCG/ACT inhaler  -still likely mild intermittent, but infrequent/insufficient dosing of inhaled corticosteroid. Rationale for prior regimen change discussed in detail, as well as visual aid shared with patient regarding asthma control definition and stepwise approach algorithm. Ultimately agreed to return to Advair, but will start with 100/50 dose. If well controlled, could return to just ICS. Albuterol if needed for breakthrough symptoms. Recheck 1 month.   Essential hypertension  -decided against med changes at present as home BP monitor was inaccurate. Plans on new machine with home  Monitoring and RTC precautions. Recheck 1 month  Meds ordered this encounter  Medications  . Fluticasone-Salmeterol (ADVAIR DISKUS) 100-50 MCG/DOSE AEPB    Sig: Inhale 1 puff into the lungs 2 (two) times daily.    Dispense:  60 each    Refill:  2  . albuterol (PROVENTIL HFA;VENTOLIN HFA) 108 (90 Base) MCG/ACT inhaler    Sig: Inhale 1-2 puffs into the lungs every 4 (four) hours as needed for wheezing or shortness of breath.    Dispense:  1  Inhaler    Refill:  0   Patient Instructions    It appears that your machine is reading too high. No change in meds for now. If new machine is running high, we can check that one as well to see if med changes are needed. Monitor for next month and follow up to decide on possible changes.    Asthma control is decreased currently likely due to being off of the control medicine Pulmicort for the past month. Make sure you continue your baseline control medicine for asthma in the future, with albuterol as needed for breakthrough symptoms. We can restart Advair as the control medicine for now, with albuterol as needed up to every 4 hours if wheezing or shortness of breath. Recheck in 1 month to decide on plan from there. Likely you will not need to be on that strong of a medicine long-term.  Return to the clinic or go to the nearest emergency room if any of your symptoms worsen or new symptoms occur.   How to Use a Dry Powder Inhaler A dry powder inhaler (DPI) is a device for taking medicine that must be breathed into the lungs (inhaled). The device is also called a disk inhaler. Your doctor will tell you how often to use your inhaler. What are the risks? If you do not use your inhaler correctly, medicine might not get to your lungs to help you breathe. Inhaler medicine can cause side effects, such as:  Mouth infection.  Throat infection.  Cough.  A voice that sounds rough (hoarseness).  Headache.  A feeling that you are going to throw up (nausea).  Throwing up (vomiting).  Lung infection (pneumonia). This can happen if you have chronic obstructive lung (pulmonary) disease (COPD).  Supplies needed:  An inhaler. The medicine that you will need is already in the inhaler. How to use a dry powder inhaler 1. Remove any gum or candy from your mouth. 2. Stand or sit up straight. 3. Hold the inhaler in one hand. 4. Put the thumb of your other hand in the thumb grip of the inhaler. Next,  push your thumb away from you as far as it will go. 5. Hold the inhaler flat, like a hamburger, with the mouthpiece facing you. 6. Use your other hand to slide the  lever that is near the mouthpiece away from you until you hear a click. 7. Turn your head and breathe out. Do not breathe out into the mouthpiece. 8. Turn your head back to the mouthpiece and seal your lips around it. 9. Take a deep breath through your mouth. Do not breathe through your nose. 10. Hold your breath for 10 seconds. 11. Remove the inhaler from your mouth, turn your head, and breathe out slowly. 12. Close the inhaler. To do this, slide the thumb grip back to the closed position. 13. Check the number on the inhaler that shows how much medicine (how many doses) the inhaler has in it. The number should go down by one each time you use the inhaler. 14. Rinse your mouth with water. General recommendations  Do not drop or shake your inhaler.  Do not wash your inhaler. If the mouthpiece gets dirty, use a dry cloth to wipe it clean.  Do not breathe into the inhaler.  Store your inhaler in a dry place at room temperature. Do not store it in your bathroom.  Keep track of your doses: ? When the dose number turns red, you have five doses left. That means it is time to pick up a new inhaler at your pharmacy. ? When the dose number shows zero (0), throw away the inhaler. Follow these instructions at home:  Take your inhaled medicine only as told by your doctor.  Keep all follow-up visits as told by your doctor. This is important. Contact a health care provider if:  You have a sore mouth.  You have a sore throat.  You have a lasting (persistent) cough.  Your voice changes.  You have a fever.  You have any of these problems after starting to use your inhaler: ? You get headaches often. ? You often have a feeling that you are going to throw up. ? You throw up often.  Your asthma or COPD symptoms have not improved  after you have been using your inhaler for a few weeks. Get help right away if:  You have very bad shortness of breath.  You have trouble breathing. Summary  A dry powder inhaler (DPI) is a device that has medicine in it that should help you breathe better.  Follow instructions from your doctor about using your inhaler.  Your doctor will prescribe the strength of the medicine and how often you need to inhale it.  Keep your inhaler dry. The medicine in the inhaler must be kept dry.  If you have any questions about how to use your inhaler, talk with your doctor or pharmacist. This information is not intended to replace advice given to you by your health care provider. Make sure you discuss any questions you have with your health care provider. Document Released: 08/17/2008 Document Revised: 08/02/2016 Document Reviewed: 08/02/2016 Elsevier Interactive Patient Education  2017 [employer].  How to Use a Metered Dose Inhaler A metered dose inhaler is a handheld device for taking medicine that must be breathed into the lungs (inhaled). The device can be used to deliver a variety of inhaled medicines, including:  Quick relief or rescue medicines, such as bronchodilators.  Controller medicines, such as corticosteroids.  The medicine is delivered by pushing down on a metal canister to release a preset amount of spray and medicine. Each device contains the amount of medicine that is needed for a preset number of uses (inhalations). Your health care provider may recommend that you use a spacer with  your inhaler to help you take the medicine more effectively. A spacer is a plastic tube with a mouthpiece on one end and an opening that connects to the inhaler on the other end. A spacer holds the medicine in a tube for a short time, which allows you to inhale more medicine. What are the risks? If you do not use your inhaler correctly, medicine might not reach your lungs to help you  breathe. Inhaler medicine can cause side effects, such as:  Mouth or throat infection.  Cough.  Hoarseness.  Headache.  Nausea and vomiting.  Lung infection (pneumonia) in people who have a lung condition called COPD.  How to use a metered dose inhaler without a spacer 15. Remove the cap from the inhaler. 16. If you are using the inhaler for the first time, shake it for 5 seconds, turn it away from your face, then release 4 puffs into the air. This is called priming. 17. Shake the inhaler for 5 seconds. 18. Position the inhaler so the top of the canister faces up. 19. Put your index finger on the top of the medicine canister. Support the bottom of the inhaler with your thumb. 20. Breathe out normally and as completely as possible, away from the inhaler. 21. Either place the inhaler between your teeth and close your lips tightly around the mouthpiece, or hold the inhaler 1-2 inches (2.5-5 cm) away from your open mouth. Keep your tongue down out of the way. If you are unsure which technique to use, ask your health care provider. 22. Press the canister down with your index finger to release the medicine, then inhale deeply and slowly through your mouth (not your nose) until your lungs are completely filled. Inhaling should take 4-6 seconds. 23. Hold the medicine in your lungs for 5-10 seconds (10 seconds is best). This helps the medicine get into the small airways of your lungs. 24. With your lips in a tight circle (pursed), breathe out slowly. 25. Repeat steps 3-10 until you have taken the number of puffs that your health care provider directed. Wait about 1 minute between puffs or as directed. 26. Put the cap on the inhaler. 27. If you are using a steroid inhaler, rinse your mouth with water, gargle, and spit out the water. Do not swallow the water. How to use a metered dose inhaler with a spacer 1. Remove the cap from the inhaler. 2. If you are using the inhaler for the first time,  shake it for 5 seconds, turn it away from your face, then release 4 puffs into the air. This is called priming. 3. Shake the inhaler for 5 seconds. 4. Place the open end of the spacer onto the inhaler mouthpiece. 5. Position the inhaler so the top of the canister faces up and the spacer mouthpiece faces you. 6. Put your index finger on the top of the medicine canister. Support the bottom of the inhaler and the spacer with your thumb. 7. Breathe out normally and as completely as possible, away from the spacer. 8. Place the spacer between your teeth and close your lips tightly around it. Keep your tongue down out of the way. 9. Press the canister down with your index finger to release the medicine, then inhale deeply and slowly through your mouth (not your nose) until your lungs are completely filled. Inhaling should take 4-6 seconds. 10. Hold the medicine in your lungs for 5-10 seconds (10 seconds is best). This helps the medicine get into the  small airways of your lungs. 11. With your lips in a tight circle (pursed), breathe out slowly. 12. Repeat steps 3-11 until you have taken the number of puffs that your health care provider directed. Wait about 1 minute between puffs or as directed. 13. Remove the spacer from the inhaler and put the cap on the inhaler. 14. If you are using a steroid inhaler, rinse your mouth with water, gargle, and spit out the water. Do not swallow the water. Follow these instructions at home:  Take your inhaled medicine only as told by your health care provider. Do not use the inhaler more than directed by your health care provider.  Keep all follow-up visits as told by your health care provider. This is important.  If your inhaler has a counter, you can check it to determine how full your inhaler is. If your inhaler does not have a counter, ask your health care provider when you will need to refill your inhaler and write the refill date on a calendar or on your inhaler  canister. Note that you cannot know when an inhaler is empty by shaking it.  Follow directions on the package insert for care and cleaning of your inhaler and spacer. Contact a health care provider if:  Symptoms are only partially relieved with your inhaler.  You are having trouble using your inhaler.  You have an increase in phlegm.  You have headaches. Get help right away if:  You feel little or no relief after using your inhaler.  You have dizziness.  You have a fast heart rate.  You have chills or a fever.  You have night sweats.  There is blood in your phlegm. Summary  A metered dose inhaler is a handheld device for taking medicine that must be breathed into the lungs (inhaled).  The medicine is delivered by pushing down on a metal canister to release a preset amount of spray and medicine.  Each device contains the amount of medicine that is needed for a preset number of uses (inhalations). This information is not intended to replace advice given to you by your health care provider. Make sure you discuss any questions you have with your health care provider. Document Released: 11/08/2005 Document Revised: 09/28/2016 Document Reviewed: 09/28/2016 Elsevier Interactive Patient Education  2017 [employer]    Hypertension Hypertension, commonly called high blood pressure, is when the force of blood pumping through the arteries is too strong. The arteries are the blood vessels that carry blood from the heart throughout the body. Hypertension forces the heart to work harder to pump blood and may cause arteries to become narrow or stiff. Having untreated or uncontrolled hypertension can cause heart attacks, strokes, kidney disease, and other problems. A blood pressure reading consists of a higher number over a lower number. Ideally, your blood pressure should be below 120/80. The first ("top") number is called the systolic pressure. It is a measure of the pressure in your  arteries as your heart beats. The second ("bottom") number is called the diastolic pressure. It is a measure of the pressure in your arteries as the heart relaxes. What are the causes? The cause of this condition is not known. What increases the risk? Some risk factors for high blood pressure are under your control. Others are not. Factors you can change  Smoking.  Having type 2 diabetes mellitus, high cholesterol, or both.  Not getting enough exercise or physical activity.  Being overweight.  Having too much fat, sugar, calories,  or salt (sodium) in your diet.  Drinking too much alcohol. Factors that are difficult or impossible to change  Having chronic kidney disease.  Having a family history of high blood pressure.  Age. Risk increases with age.  Race. You may be at higher risk if you are African-American.  Gender. Men are at higher risk than women before age 26. After age 79, women are at higher risk than men.  Having obstructive sleep apnea.  Stress. What are the signs or symptoms? Extremely high blood pressure (hypertensive crisis) may cause:  Headache.  Anxiety.  Shortness of breath.  Nosebleed.  Nausea and vomiting.  Severe chest pain.  Jerky movements you cannot control (seizures).  How is this diagnosed? This condition is diagnosed by measuring your blood pressure while you are seated, with your arm resting on a surface. The cuff of the blood pressure monitor will be placed directly against the skin of your upper arm at the level of your heart. It should be measured at least twice using the same arm. Certain conditions can cause a difference in blood pressure between your right and left arms. Certain factors can cause blood pressure readings to be lower or higher than normal (elevated) for a short period of time:  When your blood pressure is higher when you are in a health care provider's office than when you are at home, this is called white coat  hypertension. Most people with this condition do not need medicines.  When your blood pressure is higher at home than when you are in a health care provider's office, this is called masked hypertension. Most people with this condition may need medicines to control blood pressure.  If you have a high blood pressure reading during one visit or you have normal blood pressure with other risk factors:  You may be asked to return on a different day to have your blood pressure checked again.  You may be asked to monitor your blood pressure at home for 1 week or longer.  If you are diagnosed with hypertension, you may have other blood or imaging tests to help your health care provider understand your overall risk for other conditions. How is this treated? This condition is treated by making healthy lifestyle changes, such as eating healthy foods, exercising more, and reducing your alcohol intake. Your health care provider may prescribe medicine if lifestyle changes are not enough to get your blood pressure under control, and if:  Your systolic blood pressure is above 130.  Your diastolic blood pressure is above 80.  Your personal target blood pressure may vary depending on your medical conditions, your age, and other factors. Follow these instructions at home: Eating and drinking  Eat a diet that is high in fiber and potassium, and low in sodium, added sugar, and fat. An example eating plan is called the DASH (Dietary Approaches to Stop Hypertension) diet. To eat this way: ? Eat plenty of fresh fruits and vegetables. Try to fill half of your plate at each meal with fruits and vegetables. ? Eat whole grains, such as whole wheat pasta, brown rice, or whole grain bread. Fill about one quarter of your plate with whole grains. ? Eat or drink low-fat dairy products, such as skim milk or low-fat yogurt. ? Avoid fatty cuts of meat, processed or cured meats, and poultry with skin. Fill about one quarter of  your plate with lean proteins, such as fish, chicken without skin, beans, eggs, and tofu. ? Avoid premade and  processed foods. These tend to be higher in sodium, added sugar, and fat.  Reduce your daily sodium intake. Most people with hypertension should eat less than 1,500 mg of sodium a day.  Limit alcohol intake to no more than 1 drink a day for nonpregnant women and 2 drinks a day for men. One drink equals 12 oz of beer, 5 oz of wine, or 1 oz of hard liquor. Lifestyle  Work with your health care provider to maintain a healthy body weight or to lose weight. Ask what an ideal weight is for you.  Get at least 30 minutes of exercise that causes your heart to beat faster (aerobic exercise) most days of the week. Activities may include walking, swimming, or biking.  Include exercise to strengthen your muscles (resistance exercise), such as pilates or lifting weights, as part of your weekly exercise routine. Try to do these types of exercises for 30 minutes at least 3 days a week.  Do not use any products that contain nicotine or tobacco, such as cigarettes and e-cigarettes. If you need help quitting, ask your health care provider.  Monitor your blood pressure at home as told by your health care provider.  Keep all follow-up visits as told by your health care provider. This is important. Medicines  Take over-the-counter and prescription medicines only as told by your health care provider. Follow directions carefully. Blood pressure medicines must be taken as prescribed.  Do not skip doses of blood pressure medicine. Doing this puts you at risk for problems and can make the medicine less effective.  Ask your health care provider about side effects or reactions to medicines that you should watch for. Contact a health care provider if:  You think you are having a reaction to a medicine you are taking.  You have headaches that keep coming back (recurring).  You feel dizzy.  You have  swelling in your ankles.  You have trouble with your vision. Get help right away if:  You develop a severe headache or confusion.  You have unusual weakness or numbness.  You feel faint.  You have severe pain in your chest or abdomen.  You vomit repeatedly.  You have trouble breathing. Summary  Hypertension is when the force of blood pumping through your arteries is too strong. If this condition is not controlled, it may put you at risk for serious complications.  Your personal target blood pressure may vary depending on your medical conditions, your age, and other factors. For most people, a normal blood pressure is less than 120/80.  Hypertension is treated with lifestyle changes, medicines, or a combination of both. Lifestyle changes include weight loss, eating a healthy, low-sodium diet, exercising more, and limiting alcohol. This information is not intended to replace advice given to you by your health care provider. Make sure you discuss any questions you have with your health care provider. Document Released: 11/08/2005 Document Revised: 10/06/2016 Document Reviewed: 10/06/2016 Elsevier Interactive Patient Education  2018 [employer].   IF you received an x-ray today, you will receive an invoice from [facility]. Please contact [facility]iology at [phone] with questions or concerns regarding your invoice.   IF you received labwork today, you will receive an invoice from [facility]. Please contact [facility] at [phone] with questions or concerns regarding your invoice.   Our billing staff will not be able to assist you with questions regarding bills from these companies.  You will be contacted with the lab results as soon as they  are available. The fastest way to get your results is to activate your My Chart account. Instructions are located on the last page of this paperwork. If you have not heard from [place] regarding the results in 2 weeks, please contact  this office.      I personally performed the services described in this documentation, which was scribed in my presence. The recorded information has been reviewed and considered for accuracy and completeness, addended by me as needed, and agree with information above.  Signed,   [name], MD [facility] [place].  12/21/17 9:58 PM

## 2017-12-20 ENCOUNTER — Ambulatory Visit: Payer: Medicare Other | Admitting: Family Medicine

## 2018-01-13 ENCOUNTER — Ambulatory Visit: Payer: Medicare Other | Admitting: Family Medicine

## 2018-03-13 ENCOUNTER — Other Ambulatory Visit: Payer: Self-pay | Admitting: Family Medicine

## 2018-03-13 DIAGNOSIS — J4520 Mild intermittent asthma, uncomplicated: Secondary | ICD-10-CM

## 2018-03-27 ENCOUNTER — Other Ambulatory Visit: Payer: Self-pay

## 2018-03-27 ENCOUNTER — Ambulatory Visit: Payer: Medicare Other | Admitting: Physician Assistant

## 2018-03-27 ENCOUNTER — Encounter: Payer: Self-pay | Admitting: Physician Assistant

## 2018-03-27 ENCOUNTER — Ambulatory Visit (INDEPENDENT_AMBULATORY_CARE_PROVIDER_SITE_OTHER): Payer: Medicare Other

## 2018-03-27 VITALS — BP 118/82 | HR 84 | Temp 98.4°F | Ht 68.0 in | Wt 162.8 lb

## 2018-03-27 DIAGNOSIS — M79675 Pain in left toe(s): Secondary | ICD-10-CM

## 2018-03-27 NOTE — Progress Notes (Signed)
03/27/2018 11:27 AM   DOB: 04/09/1951 / MRN: [id number]  SUBJECTIVE:  Frank Howell is a 67 y.o. male presenting for here for right 5th digit pain after striking it against the bed post 2 weeks ago.  Complains of improving pain and swelling.  No gait difficulty.   He is allergic to [facility] hcl] and penicillins.   He  has a past medical history of Abnormal liver function, Allergy, Asthma, Dermatitis, GERD (gastroesophageal reflux disease), Hyperlipidemia, Hypertension, and Psoriasis.    He  reports that he has never smoked. He has never used smokeless tobacco. He reports that he drinks alcohol. He reports that he does not use drugs. He  has no sexual activity history on file. The patient  has no past surgical history on file.  His family history includes Asthma in his mother; Hypertension in his brother and sister.  Review of Systems  Constitutional: Negative for chills, diaphoresis and fever.  Gastrointestinal: Negative for nausea.  Musculoskeletal: Positive for joint pain. Negative for falls and myalgias.  Skin: Negative for rash.  Neurological: Negative for dizziness.    The problem list and medications were reviewed and updated by myself where necessary and exist elsewhere in the encounter.   OBJECTIVE:  BP 118/82 (BP Location: Left Arm, Patient Position: Sitting, Cuff Size: Normal)   Pulse 84   Temp 98.4 F (36.9 C) (Oral)   Ht 5\' 8"  (1.727 m)   Wt 162 lb 12.8 oz (73.8 kg)   SpO2 97%   BMI 24.75 kg/m   Physical Exam  Constitutional: He is oriented to person, place, and time. He appears well-developed. He does not appear ill.  Eyes: Pupils are equal, round, and reactive to light. Conjunctivae and EOM are normal.  Cardiovascular: Normal rate, regular rhythm, S1 normal, S2 normal, normal heart sounds, intact distal pulses and normal pulses. Exam reveals no gallop and no friction rub.  No murmur heard. Pulses:      Dorsalis pedis pulses are 2+ on the  right side, and 2+ on the left side.       Posterior tibial pulses are 2+ on the right side, and 2+ on the left side.  Pulmonary/Chest: Effort normal. No stridor. No respiratory distress. He has no wheezes. He has no rales.  Abdominal: He exhibits no distension.  Musculoskeletal: Normal range of motion. He exhibits no edema.       Right foot: There is normal range of motion and no deformity.       Left foot: There is normal range of motion and no deformity.  Feet:  Right Foot:  Skin Integrity: Negative for erythema.  Left Foot:  Skin Integrity: Negative for erythema.  Neurological: He is alert and oriented to person, place, and time. No cranial nerve deficit. Coordination normal.  Skin: Skin is warm and dry. He is not diaphoretic.  Psychiatric: He has a normal mood and affect.  Nursing note and vitals reviewed.   No results found for this or any previous visit (from the past 72 hour(s)).  Dg Foot Complete Right  Result Date: 03/27/2018 CLINICAL DATA:  Fifth toe pain for the past 2 weeks since trauma. EXAM: RIGHT FOOT COMPLETE - 3+ VIEW COMPARISON:  None in PACs FINDINGS: The bones are subjectively adequately mineralized. There is fusion of the middle and distal phalanges of the fifth toe. The proximal phalanx is intact. The IP joint appears normal as does the MTP joint. Fifth metatarsal exhibits no acute abnormality. Elsewhere the bones  of the foot exhibit no post traumatic injury. There is no significant arthropathic changes. The soft tissues are unremarkable. IMPRESSION: There is no acute or significant chronic bony abnormality of the right foot. Electronically Signed   By: David  Martinique M.D.   On: 03/27/2018 11:18    ASSESSMENT AND PLAN:  [name] was seen today for foot problem.  Diagnoses and all orders for this visit:  Pain of toe of left foot: Pain controlled at this time.  No fracture.  Advised he follow up as necessary.  -     DG Foot Complete Right; Future    The patient is  advised to call or return to clinic if he does not see an improvement in symptoms, or to seek the care of the closest emergency department if he worsens with the above plan.   Philis Fendt, MHS, PA-C [facility] 03/27/2018 11:27 AM

## 2018-03-27 NOTE — Patient Instructions (Addendum)
Radiologist report:    IMPRESSION: There is no acute or significant chronic bony abnormality of the right foot.    IF you received an x-ray today, you will receive an invoice from [facility] Radiology. Please contact [facility] Radiology at [phone] with questions or concerns regarding your invoice.   IF you received labwork today, you will receive an invoice from [facility]. Please contact [facility] at [phone] with questions or concerns regarding your invoice.   Our billing staff will not be able to assist you with questions regarding bills from these companies.  You will be contacted with the lab results as soon as they are available. The fastest way to get your results is to activate your My Chart account. Instructions are located on the last page of this paperwork. If you have not heard from [place] regarding the results in 2 weeks, please contact this office.

## 2018-05-24 ENCOUNTER — Other Ambulatory Visit: Payer: Self-pay | Admitting: Family Medicine

## 2018-05-24 DIAGNOSIS — I10 Essential (primary) hypertension: Secondary | ICD-10-CM

## 2018-06-14 ENCOUNTER — Encounter: Payer: Self-pay | Admitting: Physician Assistant

## 2018-06-14 ENCOUNTER — Other Ambulatory Visit: Payer: Self-pay

## 2018-06-14 ENCOUNTER — Ambulatory Visit: Payer: Medicare Other | Admitting: Physician Assistant

## 2018-06-14 VITALS — BP 137/89 | HR 83 | Temp 99.1°F | Resp 20 | Ht 68.58 in | Wt 166.5 lb

## 2018-06-14 DIAGNOSIS — J209 Acute bronchitis, unspecified: Secondary | ICD-10-CM | POA: Diagnosis not present

## 2018-06-14 MED ORDER — AZITHROMYCIN 250 MG PO TABS: ORAL_TABLET | ORAL | 0 refills | Status: DC

## 2018-06-14 NOTE — Patient Instructions (Addendum)
Please take antibiotic as prescribed. Continue with rest, hydration, and eating light meals. Return to clinic if symptoms worsen, do not improve, or as needed  In terms of elevated blood pressure, I would like you to check your blood pressure at least a couple times over the next week outside of the office and document these values. It is best if you check the blood pressure at different times in the day. Your goal is <140/90. If your values are consistently above this goal, please return to office for further evaluation. If you start to have chest pain, blurred vision, shortness of breath, severe headache, lower leg swelling, or nausea/vomiting please seek care immediately here or at the ED.     Acute Bronchitis, Adult Acute bronchitis is when air tubes (bronchi) in the lungs suddenly get swollen. The condition can make it hard to breathe. It can also cause these symptoms:  A cough.  Coughing up clear, yellow, or green mucus.  Wheezing.  Chest congestion.  Shortness of breath.  A fever.  Body aches.  Chills.  A sore throat.  Follow these instructions at home: Medicines  Take over-the-counter and prescription medicines only as told by your doctor.  If you were prescribed an antibiotic medicine, take it as told by your doctor. Do not stop taking the antibiotic even if you start to feel better. General instructions  Rest.  Drink enough fluids to keep your pee (urine) clear or pale yellow.  Avoid smoking and secondhand smoke. If you smoke and you need help quitting, ask your doctor. Quitting will help your lungs heal faster.  Use an inhaler, cool mist vaporizer, or humidifier as told by your doctor.  Keep all follow-up visits as told by your doctor. This is important. How is this prevented? To lower your risk of getting this condition again:  Wash your hands often with soap and water. If you cannot use soap and water, use hand sanitizer.  Avoid contact with people who  have cold symptoms.  Try not to touch your hands to your mouth, nose, or eyes.  Make sure to get the flu shot every year.  Contact a doctor if:  Your symptoms do not get better in 2 weeks. Get help right away if:  You cough up blood.  You have chest pain.  You have very bad shortness of breath.  You become dehydrated.  You faint (pass out) or keep feeling like you are going to pass out.  You keep throwing up (vomiting).  You have a very bad headache.  Your fever or chills gets worse. This information is not intended to replace advice given to you by your health care provider. Make sure you discuss any questions you have with your health care provider. Document Released: 04/26/2008 Document Revised: 06/16/2016 Document Reviewed: 04/28/2016 Elsevier Interactive Patient Education  2018 [employer].     IF you received an x-ray today, you will receive an invoice from [facility] Radiology. Please contact [facility] Radiology at [phone] with questions or concerns regarding your invoice.   IF you received labwork today, you will receive an invoice from [facility]. Please contact [facility] at [phone] with questions or concerns regarding your invoice.   Our billing staff will not be able to assist you with questions regarding bills from these companies.  You will be contacted with the lab results as soon as they are available. The fastest way to get your results is to activate your My Chart account. Instructions are located on the last  page of this paperwork. If you have not heard from [place] regarding the results in 2 weeks, please contact this office.

## 2018-06-14 NOTE — Progress Notes (Signed)
MRN: [id number] DOB: [birth year]  Subjective:   Frank Howell is a 67 y.o. male presenting for chief complaint of Chest Congestion (X 2 weeks - pt states that he had a cough but it's better but still feel congestion in chest) .  Reports 2 week history of illness. Started out with head cold but then settled in his chest. Has had a productive cough, producing whitish-yellow phlegm. Has tried mucinex with no full relief. Denies fever, sinus pain, ear pain, wheezing, shortness of breath and chest pain, nausea, vomiting, abdominal pain and diarrhea. Has not had sick contact with anyone. No history of seasonal allergies, has history of asthma but notes this has not flared his asthma symptoms, has not had to increase albuterol inhaler use. Denies smoking. Denies any other aggravating or relieving factors, no other questions or concerns.  Frank Howell has a current medication list which includes the following prescription(s): adalimumab, advair diskus, albuterol, augmented betamethasone dipropionate, losartan-hydrochlorothiazide, and azithromycin, and the following Facility-Administered Medications: nitroglycerin. Also is allergic to [facility] hcl] and penicillins.  Frank Howell  has a past medical history of Abnormal liver function, Allergy, Asthma, Dermatitis, GERD (gastroesophageal reflux disease), Hyperlipidemia, Hypertension, and Psoriasis. Also  has no past surgical history on file.   Objective:   Vitals: BP 137/89   Pulse 83   Temp 99.1 F (37.3 C) (Oral)   Resp 20   Ht 5' 8.58" (1.742 m)   Wt 166 lb 8 oz (75.5 kg)   SpO2 98%   BMI 24.89 kg/m   Physical Exam  Constitutional: He is oriented to person, place, and time. He appears well-developed and well-nourished. No distress.  HENT:  Head: Normocephalic and atraumatic.  Right Ear: Tympanic membrane, external ear and ear canal normal.  Left Ear: Tympanic membrane, external ear and ear canal normal.  Nose: Mucosal edema present.  Right sinus exhibits no maxillary sinus tenderness and no frontal sinus tenderness. Left sinus exhibits no maxillary sinus tenderness and no frontal sinus tenderness.  Mouth/Throat: Uvula is midline and mucous membranes are normal. No posterior oropharyngeal edema, posterior oropharyngeal erythema or tonsillar abscesses. No tonsillar exudate.  Eyes: Conjunctivae are normal.  Neck: Normal range of motion.  Cardiovascular: Normal rate, regular rhythm, normal heart sounds and intact distal pulses.  Pulmonary/Chest: Effort normal. He has no decreased breath sounds. He has no wheezes. He has rhonchi (few scattered rhonchi, cleared with cough). He has no rales.  Lymphadenopathy:       Head (right side): No submental, no submandibular, no tonsillar, no preauricular, no posterior auricular and no occipital adenopathy present.       Head (left side): No submental, no submandibular, no tonsillar, no preauricular, no posterior auricular and no occipital adenopathy present.    He has no cervical adenopathy.       Right: No supraclavicular adenopathy present.       Left: No supraclavicular adenopathy present.  Neurological: He is alert and oriented to person, place, and time.  Skin: Skin is warm and dry.  Psychiatric: He has a normal mood and affect.  Vitals reviewed.   No results found for this or any previous visit (from the past 24 hour(s)).  Assessment and Plan :  1. Acute bronchitis, unspecified organism He is overall well appearing, NAD. Due to duration of sx and hx,will treat empirically with zpack for coverage of atypical lung etiology. No wheezing noted on exam, which is reassuring. Pt encouraged to return to clinic if symptoms worsen, do not improve,  or as needed.  - azithromycin (ZITHROMAX) 250 MG tablet; Take 2 tabs PO x 1 dose, then 1 tab PO QD x 4 days  Dispense: 6 tablet; Refill: 0   Tenna Delaine, PA-C  [facility] [place] 06/14/2018 2:05 PM

## 2018-08-30 ENCOUNTER — Other Ambulatory Visit: Payer: Self-pay | Admitting: Family Medicine

## 2018-08-30 DIAGNOSIS — I10 Essential (primary) hypertension: Secondary | ICD-10-CM

## 2018-08-30 NOTE — Telephone Encounter (Signed)
Requested medication (s) are due for refill today: Yes  Requested medication (s) are on the active medication list: No  Last refill:  05/24/18 (Losartan-HCTZ 100-12.5)  Future visit scheduled: No  Notes to clinic:  Unable to refill per protocol; medication requested is a substitution due to current medication on backorder; abnormal labs.     Requested Prescriptions  Pending Prescriptions Disp Refills   losartan (COZAAR) 100 MG tablet [Pharmacy Med Name: LOSARTAN POTASSIUM 100 MG TAB] 1 tablet 0    Sig: Please specify directions, refills and quantity     Cardiovascular:  Angiotensin Receptor Blockers Failed - 08/30/2018  4:43 PM      Failed - Cr in normal range and within 180 days    Creat  Date Value Ref Range Status  09/30/2016 1.45 (H) 0.70 - 1.25 mg/dL Final    Comment:      For patients > or = 69 years of age: The upper reference limit for Creatinine is approximately 13% higher for people identified as African-American.      Creatinine, Ser  Date Value Ref Range Status  09/06/2017 1.60 (H) 0.76 - 1.27 mg/dL Final         Failed - K in normal range and within 180 days    Potassium  Date Value Ref Range Status  09/06/2017 3.6 3.5 - 5.2 mmol/L Final         Passed - Patient is not pregnant      Passed - Last BP in normal range    BP Readings from Last 1 Encounters:  06/14/18 137/89         Passed - Valid encounter within last 6 months    Recent Outpatient Visits          2 months ago Acute bronchitis, unspecified organism   [facility] [place], [place] D, PA-C   5 months ago Pain of toe of left foot   [facility] [place] [facility], Audrie Lia, PA-C   8 months ago Mild intermittent asthma, unspecified whether complicated   [facility] [place] [facility], Ranell Patrick, MD   11 months ago Medicare annual wellness visit, subsequent   [facility] [place], Ranell Patrick, MD   1 year ago Chest congestion   [facility], Ines Bloomer,  MD

## 2018-09-01 NOTE — Telephone Encounter (Signed)
Due for OV, but could refill short term. However it appears he was on losartan HCT, not losartan.  Does it need to be separated (losartan and separate HCTZ)?

## 2018-09-01 NOTE — Telephone Encounter (Signed)
Rx is on back order.

## 2018-09-10 MED ORDER — HYDROCHLOROTHIAZIDE 12.5 MG PO CAPS: 12.5000 mg | ORAL_CAPSULE | Freq: Every day | ORAL | 0 refills | Status: DC

## 2018-09-10 MED ORDER — LOSARTAN POTASSIUM 100 MG PO TABS: 100.0000 mg | ORAL_TABLET | Freq: Every day | ORAL | 0 refills | Status: DC

## 2018-09-10 NOTE — Telephone Encounter (Signed)
Split meds and sent # 30 of each. Keep scheduled follow up next month.

## 2018-09-10 NOTE — Telephone Encounter (Signed)
Pharmacy note for change in medication sent to Dr. Carlota Raspberry.  Backorder on losartan HCTC - ? Chg to losartan/Cozaar

## 2018-10-03 ENCOUNTER — Encounter: Payer: Self-pay | Admitting: Family Medicine

## 2018-10-03 ENCOUNTER — Other Ambulatory Visit: Payer: Self-pay

## 2018-10-03 ENCOUNTER — Ambulatory Visit (INDEPENDENT_AMBULATORY_CARE_PROVIDER_SITE_OTHER): Payer: Medicare Other | Admitting: Family Medicine

## 2018-10-03 VITALS — BP 142/92 | HR 77 | Temp 97.8°F | Resp 16 | Ht 67.0 in | Wt 162.8 lb

## 2018-10-03 DIAGNOSIS — Z0000 Encounter for general adult medical examination without abnormal findings: Secondary | ICD-10-CM

## 2018-10-03 DIAGNOSIS — J4520 Mild intermittent asthma, uncomplicated: Secondary | ICD-10-CM

## 2018-10-03 DIAGNOSIS — Z23 Encounter for immunization: Secondary | ICD-10-CM

## 2018-10-03 DIAGNOSIS — E785 Hyperlipidemia, unspecified: Secondary | ICD-10-CM | POA: Diagnosis not present

## 2018-10-03 DIAGNOSIS — Z0001 Encounter for general adult medical examination with abnormal findings: Secondary | ICD-10-CM | POA: Diagnosis not present

## 2018-10-03 DIAGNOSIS — Z125 Encounter for screening for malignant neoplasm of prostate: Secondary | ICD-10-CM | POA: Diagnosis not present

## 2018-10-03 DIAGNOSIS — R3989 Other symptoms and signs involving the genitourinary system: Secondary | ICD-10-CM

## 2018-10-03 DIAGNOSIS — I10 Essential (primary) hypertension: Secondary | ICD-10-CM

## 2018-10-03 MED ORDER — FLUTICASONE-SALMETEROL 100-50 MCG/DOSE IN AEPB: INHALATION_SPRAY | RESPIRATORY_TRACT | 5 refills | Status: DC

## 2018-10-03 MED ORDER — LOSARTAN POTASSIUM 100 MG PO TABS: 100.0000 mg | ORAL_TABLET | Freq: Every day | ORAL | 0 refills | Status: DC

## 2018-10-03 MED ORDER — ALBUTEROL SULFATE HFA 108 (90 BASE) MCG/ACT IN AERS: 1.0000 | INHALATION_SPRAY | RESPIRATORY_TRACT | 0 refills | Status: DC | PRN

## 2018-10-03 NOTE — Patient Instructions (Addendum)
If you require albuterol more frequently, I would recommend increasing dose of Advair.  For right now take Advair twice per day every day for asthma prevention. Recheck in next 2 months to discuss asthma further.   Keep a record of your blood pressures outside of the office and if over 140/90 may need to restart HCTZ.   Return in next week for pneumonia vaccine.   Schedule dentist visit.   R side of prostate felt firm - possible nodule. Please call urologist for appointment.  Let me know if a referral is needed.    Asthma, Adult Asthma is a recurring condition in which the airways tighten and narrow. Asthma can make it difficult to breathe. It can cause coughing, wheezing, and shortness of breath. Asthma episodes, also called asthma attacks, range from minor to life-threatening. Asthma cannot be cured, but medicines and lifestyle changes can help control it. What are the causes? Asthma is believed to be caused by inherited (genetic) and environmental factors, but its exact cause is unknown. Asthma may be triggered by allergens, lung infections, or irritants in the air. Asthma triggers are different for each person. Common triggers include:  Animal dander.  Dust mites.  Cockroaches.  Pollen from trees or grass.  Mold.  Smoke.  Air pollutants such as dust, household cleaners, hair sprays, aerosol sprays, paint fumes, strong chemicals, or strong odors.  Cold air, weather changes, and winds (which increase molds and pollens in the air).  Strong emotional expressions such as crying or laughing hard.  Stress.  Certain medicines (such as aspirin) or types of drugs (such as beta-blockers).  Sulfites in foods and drinks. Foods and drinks that may contain sulfites include dried fruit, potato chips, and sparkling grape juice.  Infections or inflammatory conditions such as the flu, a cold, or an inflammation of the nasal membranes (rhinitis).  Gastroesophageal reflux disease  (GERD).  Exercise or strenuous activity.  What are the signs or symptoms? Symptoms may occur immediately after asthma is triggered or many hours later. Symptoms include:  Wheezing.  Excessive nighttime or early morning coughing.  Frequent or severe coughing with a common cold.  Chest tightness.  Shortness of breath.  How is this diagnosed? The diagnosis of asthma is made by a review of your medical history and a physical exam. Tests may also be performed. These may include:  Lung function studies. These tests show how much air you breathe in and out.  Allergy tests.  Imaging tests such as X-rays.  How is this treated? Asthma cannot be cured, but it can usually be controlled. Treatment involves identifying and avoiding your asthma triggers. It also involves medicines. There are 2 classes of medicine used for asthma treatment:  Controller medicines. These prevent asthma symptoms from occurring. They are usually taken every day.  Reliever or rescue medicines. These quickly relieve asthma symptoms. They are used as needed and provide short-term relief.  Your health care provider will help you create an asthma action plan. An asthma action plan is a written plan for managing and treating your asthma attacks. It includes a list of your asthma triggers and how they may be avoided. It also includes information on when medicines should be taken and when their dosage should be changed. An action plan may also involve the use of a device called a peak flow meter. A peak flow meter measures how well the lungs are working. It helps you monitor your condition. Follow these instructions at home:  Take medicines only  as directed by your health care provider. Speak with your health care provider if you have questions about how or when to take the medicines.  Use a peak flow meter as directed by your health care provider. Record and keep track of readings.  Understand and use the action plan to  help minimize or stop an asthma attack without needing to seek medical care.  Control your home environment in the following ways to help prevent asthma attacks: ? Do not smoke. Avoid being exposed to secondhand smoke. ? Change your heating and air conditioning filter regularly. ? Limit your use of fireplaces and wood stoves. ? Get rid of pests (such as roaches and mice) and their droppings. ? Throw away plants if you see mold on them. ? Clean your floors and dust regularly. Use unscented cleaning products. ? Try to have someone else vacuum for you regularly. Stay out of rooms while they are being vacuumed and for a short while afterward. If you vacuum, use a dust mask from a hardware store, a double-layered or microfilter vacuum cleaner bag, or a vacuum cleaner with a HEPA filter. ? Replace carpet with wood, tile, or vinyl flooring. Carpet can trap dander and dust. ? Use allergy-proof pillows, mattress covers, and box spring covers. ? Wash bed sheets and blankets every week in hot water and dry them in a dryer. ? Use blankets that are made of polyester or cotton. ? Clean bathrooms and kitchens with bleach. If possible, have someone repaint the walls in these rooms with mold-resistant paint. Keep out of the rooms that are being cleaned and painted. ? Wash hands frequently. Contact a health care provider if:  You have wheezing, shortness of breath, or a cough even if taking medicine to prevent attacks.  The colored mucus you cough up (sputum) is thicker than usual.  Your sputum changes from clear or white to yellow, green, gray, or bloody.  You have any problems that may be related to the medicines you are taking (such as a rash, itching, swelling, or trouble breathing).  You are using a reliever medicine more than 2-3 times per week.  Your peak flow is still at 50-79% of your personal best after following your action plan for 1 hour.  You have a fever. Get help right away if:  You  seem to be getting worse and are unresponsive to treatment during an asthma attack.  You are short of breath even at rest.  You get short of breath when doing very little physical activity.  You have difficulty eating, drinking, or talking due to asthma symptoms.  You develop chest pain.  You develop a fast heartbeat.  You have a bluish color to your lips or fingernails.  You are light-headed, dizzy, or faint.  Your peak flow is less than 50% of your personal best. This information is not intended to replace advice given to you by your health care provider. Make sure you discuss any questions you have with your health care provider. Document Released: 11/08/2005 Document Revised: 04/21/2016 Document Reviewed: 06/07/2013 Elsevier Interactive Patient Education  2017 [employer].     How to Take Your Blood Pressure You can take your blood pressure at home with a machine. You may need to check your blood pressure at home:  To check if you have high blood pressure (hypertension).  To check your blood pressure over time.  To make sure your blood pressure medicine is working.  Supplies needed: You will need a blood  pressure machine, or monitor. You can buy one at a drugstore or online. When choosing one:  Choose one with an arm cuff.  Choose one that wraps around your upper arm. Only one finger should fit between your arm and the cuff.  Do not choose one that measures your blood pressure from your wrist or finger.  Your doctor can suggest a monitor. How to prepare Avoid these things for 30 minutes before checking your blood pressure:  Drinking caffeine.  Drinking alcohol.  Eating.  Smoking.  Exercising.  Five minutes before checking your blood pressure:  Pee.  Sit in a dining chair. Avoid sitting in a soft couch or armchair.  Be quiet. Do not talk.  How to take your blood pressure Follow the instructions that came with your machine. If you have a digital  blood pressure monitor, these may be the instructions: 1. Sit up straight. 2. Place your feet on the floor. Do not cross your ankles or legs. 3. Rest your left arm at the level of your heart. You may rest it on a table, desk, or chair. 4. Pull up your shirt sleeve. 5. Wrap the blood pressure cuff around the upper part of your left arm. The cuff should be 1 inch (2.5 cm) above your elbow. It is best to wrap the cuff around bare skin. 6. Fit the cuff snugly around your arm. You should be able to place only one finger between the cuff and your arm. 7. Put the cord inside the groove of your elbow. 8. Press the power button. 9. Sit quietly while the cuff fills with air and loses air. 10. Write down the numbers on the screen. 11. Wait 2-3 minutes and then repeat steps 1-10.  What do the numbers mean? Two numbers make up your blood pressure. The first number is called systolic pressure. The second is called diastolic pressure. An example of a blood pressure reading is "120 over 80" (or 120/80). If you are an adult and do not have a medical condition, use this guide to find out if your blood pressure is normal: Normal  First number: below 120.  Second number: below 80. Elevated  First number: 120-129.  Second number: below 80. Hypertension stage 1  First number: 130-139.  Second number: 80-89. Hypertension stage 2  First number: 140 or above.  Second number: 13 or above. Your blood pressure is above normal even if only the top or bottom number is above normal. Follow these instructions at home:  Check your blood pressure as often as your doctor tells you to.  Take your monitor to your next doctor's appointment. Your doctor will: ? Make sure you are using it correctly. ? Make sure it is working right.  Make sure you understand what your blood pressure numbers should be.  Tell your doctor if your medicines are causing side effects. Contact a doctor if:  Your blood pressure  keeps being high. Get help right away if:  Your first blood pressure number is higher than 180.  Your second blood pressure number is higher than 120. This information is not intended to replace advice given to you by your health care provider. Make sure you discuss any questions you have with your health care provider. Document Released: 10/21/2008 Document Revised: 10/06/2016 Document Reviewed: 04/16/2016 Elsevier Interactive Patient Education  2018 [place] 65 Years and Older, Male Preventive care refers to lifestyle choices and visits with your health care provider that can promote  health and wellness. What does preventive care include?  A yearly physical exam. This is also called an annual well check.  Dental exams once or twice a year.  Routine eye exams. Ask your health care provider how often you should have your eyes checked.  Personal lifestyle choices, including: ? Daily care of your teeth and gums. ? Regular physical activity. ? Eating a healthy diet. ? Avoiding tobacco and drug use. ? Limiting alcohol use. ? Practicing safe sex. ? Taking low doses of aspirin every day. ? Taking vitamin and mineral supplements as recommended by your health care provider. What happens during an annual well check? The services and screenings done by your health care provider during your annual well check will depend on your age, overall health, lifestyle risk factors, and family history of disease. Counseling Your health care provider may ask you questions about your:  Alcohol use.  Tobacco use.  Drug use.  Emotional well-being.  Home and relationship well-being.  Sexual activity.  Eating habits.  History of falls.  Memory and ability to understand (cognition).  Work and work Statistician.  Screening You may have the following tests or measurements:  Height, weight, and BMI.  Blood pressure.  Lipid and cholesterol levels. These may be  checked every 5 years, or more frequently if you are over 27 years old.  Skin check.  Lung cancer screening. You may have this screening every year starting at age 23 if you have a 30-pack-year history of smoking and currently smoke or have quit within the past 15 years.  Fecal occult blood test (FOBT) of the stool. You may have this test every year starting at age 22.  Flexible sigmoidoscopy or colonoscopy. You may have a sigmoidoscopy every 5 years or a colonoscopy every 10 years starting at age 24.  Prostate cancer screening. Recommendations will vary depending on your family history and other risks.  Hepatitis C blood test.  Hepatitis B blood test.  Sexually transmitted disease (STD) testing.  Diabetes screening. This is done by checking your blood sugar (glucose) after you have not eaten for a while (fasting). You may have this done every 1-3 years.  Abdominal aortic aneurysm (AAA) screening. You may need this if you are a current or former smoker.  Osteoporosis. You may be screened starting at age 55 if you are at high risk.  Talk with your health care provider about your test results, treatment options, and if necessary, the need for more tests. Vaccines Your health care provider may recommend certain vaccines, such as:  Influenza vaccine. This is recommended every year.  Tetanus, diphtheria, and acellular pertussis (Tdap, Td) vaccine. You may need a Td booster every 10 years.  Varicella vaccine. You may need this if you have not been vaccinated.  Zoster vaccine. You may need this after age 32.  Measles, mumps, and rubella (MMR) vaccine. You may need at least one dose of MMR if you were born in 1957 or later. You may also need a second dose.  Pneumococcal 13-valent conjugate (PCV13) vaccine. One dose is recommended after age 58.  Pneumococcal polysaccharide (PPSV23) vaccine. One dose is recommended after age 51.  Meningococcal vaccine. You may need this if you have  certain conditions.  Hepatitis A vaccine. You may need this if you have certain conditions or if you travel or work in places where you may be exposed to hepatitis A.  Hepatitis B vaccine. You may need this if you have certain conditions or if you  travel or work in places where you may be exposed to hepatitis B.  Haemophilus influenzae type b (Hib) vaccine. You may need this if you have certain risk factors.  Talk to your health care provider about which screenings and vaccines you need and how often you need them. This information is not intended to replace advice given to you by your health care provider. Make sure you discuss any questions you have with your health care provider. Document Released: 12/05/2015 Document Revised: 07/28/2016 Document Reviewed: 09/09/2015 Elsevier Interactive Patient Education  [employer].   If you have lab work done today you will be contacted with your lab results within the next 2 weeks.  If you have not heard from [place] then please contact us. The fastest way to get your results is to register for My Chart.   IF you received an x-ray today, you will receive an invoice from [facility]. Please contact [facility] at [phone] with questions or concerns regarding your invoice.   IF you received labwork today, you will receive an invoice from [place]. Please contact [facility] at [phone] with questions or concerns regarding your invoice.   Our billing staff will not be able to assist you with questions regarding bills from these companies.  You will be contacted with the lab results as soon as they are available. The fastest way to get your results is to activate your My Chart account. Instructions are located on the last page of this paperwork. If you have not heard from [place] regarding the results in 2 weeks, please contact this office.

## 2018-10-03 NOTE — Progress Notes (Signed)
Subjective:    Patient ID: Frank Howell, male    DOB: [birth year], 67 y.o.   MRN: [id number]  HPI Frank Howell is a 67 y.o. male Presents today for: Chief Complaint  Patient presents with  . Annual Exam   History of psoriasis, hypertension, asthma, hypertension.   Asthma: Takes Advair 100/50 - taking twice per day every day.  Had run out but now back on meds. Rare albuterol if needed - only during flair.  Only needing about twice per month.   Hypertension: BP Readings from Last 3 Encounters:  10/03/18 (!) 142/92  06/14/18 137/89  03/27/18 118/82   Lab Results  Component Value Date   CREATININE 1.60 (H) 09/06/2017  not taking hydrochlorothiazide 12.5 mg daily.  Just losartan 100 mg daily.  Just took losartan - usually in mornings. No recent home readings.   Hyperlipidemia:  Lab Results  Component Value Date   CHOL 201 (H) 09/06/2017   HDL 60 09/06/2017   LDLCALC 127 (H) 09/06/2017   TRIG 71 09/06/2017   CHOLHDL 3.4 09/06/2017   Lab Results  Component Value Date   ALT 13 09/06/2017   AST 17 09/06/2017   ALKPHOS 69 09/06/2017   BILITOT 1.4 (H) 09/06/2017  Previously on Crestor, had stopped Crestor when discussed at [facility] wellness exam last year. Still not on meds. Tried diet approach.   Psoriasis: On Humira.   Cancer screening: Colonoscopy December 2011. Prostate cancer screening: Last PSA of 2.7 in January 2018. Has seen urology in past - wants testing today.   Immunization History  Administered Date(s) Administered  . Influenza,inj,Quad PF,6+ Mos 08/09/2015, 09/06/2017, 10/03/2018  . Pneumococcal Conjugate-13 04/29/2016  . Pneumococcal Polysaccharide-23 03/27/2012  . Tdap 11/19/2009  Shingles vaccine previously discussed, concerns with taking Humira. Still checking into that.  Discussed repeat Pneumovax given history of asthma when he had a wellness exam last year.  Declined at that time and plan to return to have that vaccine.  Fall screening: No  falls in the past year  Depression screening Depression screen [facility] 2/9 10/03/2018 06/14/2018 03/27/2018 12/19/2017 09/06/2017  Decreased Interest 0 0 0 0 0  Down, Depressed, Hopeless 0 0 0 0 0  PHQ - 2 Score 0 0 0 0 0   Functional status screening: Functional Status Survey: Is the patient deaf or have difficulty hearing?: No Does the patient have difficulty seeing, even when wearing glasses/contacts?: No Does the patient have difficulty concentrating, remembering, or making decisions?: No Does the patient have difficulty walking or climbing stairs?: No Does the patient have difficulty dressing or bathing?: No Does the patient have difficulty doing errands alone such as visiting a doctor's office or shopping?: No   Memory screen: 6CIT Screen 10/03/2018  What Year? 0 points  What month? 0 points  What time? 0 points  Count back from 20 0 points  Months in reverse 0 points  Repeat phrase 0 points  Total Score 0   Vision screen: last optho visit in January.   Visual Acuity Screening   Right eye Left eye Both eyes  Without correction: 20/25 20/25 20/25   With correction:      Dental: no recent visit.   Exercise: walking at times.   Advanced directives:  Has HCPOA and living will.   Patient Active Problem List   Diagnosis Date Noted  . Pain in the chest   . Chest pain 08/08/2015  . Psoriasis 08/08/2015  . Asthma 01/10/2012  . Hypertension 01/10/2012  . Hyperlipidemia 01/10/2012  .  GERD 01/20/2010  . FECAL OCCULT BLOOD 01/20/2010   Past Medical History:  Diagnosis Date  . Abnormal liver function   . Allergy   . Asthma    per patient has not had asthma attack in five years  . Dermatitis   . GERD (gastroesophageal reflux disease)    patient stated GERD is no longer a problem for him  . Hyperlipidemia   . Hypertension   . Psoriasis    No past surgical history on file. Allergies  Allergen Reactions  . Malarone [Atovaquone-Proguanil Hcl] Rash  . Penicillins     Prior to Admission medications   Medication Sig Start Date End Date Taking? Authorizing Provider  Adalimumab (HUMIRA) 40 MG/0.8ML PSKT Inject 40 mg into the skin every 30 (thirty) days.   Yes [provider]  ADVAIR DISKUS 100-50 MCG/DOSE AEPB TAKE 1 PUFF BY MOUTH TWICE A DAY 03/13/18  Yes [name], MD  albuterol (PROVENTIL HFA;VENTOLIN HFA) 108 (90 Base) MCG/ACT inhaler Inhale 1-2 puffs into the lungs every 4 (four) hours as needed for wheezing or shortness of breath. 12/19/17  Yes [name], MD  augmented betamethasone dipropionate (DIPROLENE-AF) 6.38 % ointment APPLICATIONS APPLY ON THE SKIN APPLY TWICE DAILY TO LEGS 02/22/18  Yes [provider]  hydrochlorothiazide (MICROZIDE) 12.5 MG capsule Take 1 capsule (12.5 mg total) by mouth daily. Patient not taking: Reported on 10/03/2018 09/10/18   [name], MD  losartan (COZAAR) 100 MG tablet Take 1 tablet (100 mg total) by mouth daily. Patient not taking: Reported on 10/03/2018 09/10/18   [name], MD   Social History   Socioeconomic History  . Marital status: Married    Spouse name: Not on file  . Number of children: 3  . Years of education: Not on file  . Highest education level: Not on file  Occupational History  . Not on file  Social Needs  . Financial resource strain: Not on file  . Food insecurity:    Worry: Not on file    Inability: Not on file  . Transportation needs:    Medical: Not on file    Non-medical: Not on file  Tobacco Use  . Smoking status: Never Smoker  . Smokeless tobacco: Never Used  Substance and Sexual Activity  . Alcohol use: Yes    Alcohol/week: 0.0 standard drinks    Comment: occasionally  . Drug use: No  . Sexual activity: Yes  Lifestyle  . Physical activity:    Days per week: Not on file    Minutes per session: Not on file  . Stress: Not on file  Relationships  . Social connections:    Talks on phone: Not on file    Gets together: Not on  file    Attends religious service: Not on file    Active member of club or organization: Not on file    Attends meetings of clubs or organizations: Not on file    Relationship status: Not on file  . Intimate partner violence:    Fear of current or ex partner: Not on file    Emotionally abused: Not on file    Physically abused: Not on file    Forced sexual activity: Not on file  Other Topics Concern  . 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   Respiratory: Positive for chest tightness (with asthma. ) and wheezing.   Musculoskeletal: Positive for myalgias (occasional back pain, not persistnet. ).  Skin: Positive for rash (psoriasis. no new symptoms. ).      Objective:   Physical Exam  Constitutional: He is oriented to person, place, and time. He appears well-developed and well-nourished.  HENT:  Head: Normocephalic and atraumatic.  Right Ear: External ear normal.  Left Ear: External ear normal.  Mouth/Throat: Oropharynx is clear and moist.  Eyes: Pupils are equal, round, and reactive to light. Conjunctivae and EOM are normal.  Neck: Normal range of motion. Neck supple. No thyromegaly present.  Cardiovascular: Normal rate, regular rhythm, normal heart sounds and intact distal pulses.  Pulmonary/Chest: Effort normal and breath sounds normal. No respiratory distress. He has no wheezes.  Abdominal: Soft. He exhibits no distension. There is no tenderness. Hernia confirmed negative in the right inguinal area and confirmed negative in the left inguinal area.  Genitourinary: Prostate is enlarged (firm R lobe, nontender. ).  Musculoskeletal: Normal range of motion. He exhibits no edema or tenderness.  Lymphadenopathy:    He has no cervical adenopathy.   Neurological: He is alert and oriented to person, place, and time. He has normal reflexes.  Skin: Skin is warm and dry.  Psychiatric: He has a normal mood and affect. His behavior is normal.  Vitals reviewed.  Vitals:   10/03/18 1326 10/03/18 1343 10/03/18 1344  BP: (!) 167/93 138/90 (!) 142/92  Pulse: 77    Resp: 16    Temp: 97.8 F (36.6 C)    TempSrc: Oral    SpO2: 100%    Weight: 162 lb 12.8 oz (73.8 kg)    Height: 5' 7"  (1.702 m)         Assessment & Plan:    Rony Ratz is a 67 y.o. male Medicare annual wellness visit, subsequent   - anticipatory guidance as below in AVS, screening labs if needed. Health maintenance items as above in HPI discussed/recommended as applicable.  - no concerning responses on depression, fall, or functional status screening. Any positive responses noted as above. Advanced directives discussed as in CHL.   Need for prophylactic vaccination and inoculation against influenza - Plan: Flu Vaccine QUAD 36+ mos IM  Mild intermittent asthma, unspecified whether complicated - Plan: Fluticasone-Salmeterol (ADVAIR DISKUS) 100-50 MCG/DOSE AEPB, albuterol (PROVENTIL HFA;VENTOLIN HFA) 108 (90 Base) MCG/ACT inhaler  - advair to be used twice per day, then albuterol for PRN /breakjthrough sx's.   - recheck next 2 months. .   Essential hypertension - Plan: losartan (COZAAR) 100 MG tablet, Comprehensive metabolic panel  - borderline, continue same regimen but monitor home readings and consider restart HCTZ if elevated.   Special screening, prostate cancer - Plan: PSA Abnormal prostate exam  - R lobe abnormal on exam as above. Check PSA, but recommended follow up with urology.   Hyperlipidemia, unspecified hyperlipidemia type - Plan: Lipid panel, Comprehensive metabolic panel  - check labs, consider restart statin depending on results.     Meds ordered this encounter  Medications  . losartan (COZAAR) 100 MG tablet    Sig: Take 1 tablet (100 mg total)  by mouth daily.    Dispense:  30 tablet    Refill:  0  . Fluticasone-Salmeterol (ADVAIR DISKUS) 100-50 MCG/DOSE AEPB    Sig: TAKE 1 PUFF BY MOUTH TWICE A  DAY    Dispense:  60 each    Refill:  5  . albuterol (PROVENTIL HFA;VENTOLIN HFA) 108 (90 Base) MCG/ACT inhaler    Sig: Inhale 1-2 puffs into the lungs every 4 (four) hours as needed for wheezing or shortness of breath.    Dispense:  1 Inhaler    Refill:  0   Patient Instructions    If you require albuterol more frequently, I would recommend increasing dose of Advair.  For right now take Advair twice per day every day for asthma prevention. Recheck in next 2 months to discuss asthma further.   Keep a record of your blood pressures outside of the office and if over 140/90 may need to restart HCTZ.   Return in next week for pneumonia vaccine.   Schedule dentist visit.   R side of prostate felt firm - possible nodule. Please call urologist for appointment.  Let me know if a referral is needed.    Asthma, Adult Asthma is a recurring condition in which the airways tighten and narrow. Asthma can make it difficult to breathe. It can cause coughing, wheezing, and shortness of breath. Asthma episodes, also called asthma attacks, range from minor to life-threatening. Asthma cannot be cured, but medicines and lifestyle changes can help control it. What are the causes? Asthma is believed to be caused by inherited (genetic) and environmental factors, but its exact cause is unknown. Asthma may be triggered by allergens, lung infections, or irritants in the air. Asthma triggers are different for each person. Common triggers include:  Animal dander.  Dust mites.  Cockroaches.  Pollen from trees or grass.  Mold.  Smoke.  Air pollutants such as dust, household cleaners, hair sprays, aerosol sprays, paint fumes, strong chemicals, or strong odors.  Cold air, weather changes, and winds (which increase molds and pollens in the air).  Strong  emotional expressions such as crying or laughing hard.  Stress.  Certain medicines (such as aspirin) or types of drugs (such as beta-blockers).  Sulfites in foods and drinks. Foods and drinks that may contain sulfites include dried fruit, potato chips, and sparkling grape juice.  Infections or inflammatory conditions such as the flu, a cold, or an inflammation of the nasal membranes (rhinitis).  Gastroesophageal reflux disease (GERD).  Exercise or strenuous activity.  What are the signs or symptoms? Symptoms may occur immediately after asthma is triggered or many hours later. Symptoms include:  Wheezing.  Excessive nighttime or early morning coughing.  Frequent or severe coughing with a common cold.  Chest tightness.  Shortness of breath.  How is this diagnosed? The diagnosis of asthma is made by a review of your medical history and a physical exam. Tests may also be performed. These may include:  Lung function studies. These tests show how much air you breathe in and out.  Allergy tests.  Imaging tests such as X-rays.  How is this treated? Asthma cannot be cured, but it can usually be controlled. Treatment involves identifying and avoiding your asthma triggers. It also involves medicines. There are 2 classes of medicine used for asthma treatment:  Controller medicines. These prevent asthma symptoms from occurring. They are usually taken every day.  Reliever or rescue medicines. These quickly relieve asthma symptoms. They are used as needed and provide short-term relief.  Your health care provider will help you create an asthma action plan. An asthma action plan is a written plan for managing and treating your asthma attacks. It includes a list of your  asthma triggers and how they may be avoided. It also includes information on when medicines should be taken and when their dosage should be changed. An action plan may also involve the use of a device called a peak flow  meter. A peak flow meter measures how well the lungs are working. It helps you monitor your condition. Follow these instructions at home:  Take medicines only as directed by your health care provider. Speak with your health care provider if you have questions about how or when to take the medicines.  Use a peak flow meter as directed by your health care provider. Record and keep track of readings.  Understand and use the action plan to help minimize or stop an asthma attack without needing to seek medical care.  Control your home environment in the following ways to help prevent asthma attacks: ? Do not smoke. Avoid being exposed to secondhand smoke. ? Change your heating and air conditioning filter regularly. ? Limit your use of fireplaces and wood stoves. ? Get rid of pests (such as roaches and mice) and their droppings. ? Throw away plants if you see mold on them. ? Clean your floors and dust regularly. Use unscented cleaning products. ? Try to have someone else vacuum for you regularly. Stay out of rooms while they are being vacuumed and for a short while afterward. If you vacuum, use a dust mask from a hardware store, a double-layered or microfilter vacuum cleaner bag, or a vacuum cleaner with a HEPA filter. ? Replace carpet with wood, tile, or vinyl flooring. Carpet can trap dander and dust. ? Use allergy-proof pillows, mattress covers, and box spring covers. ? Wash bed sheets and blankets every week in hot water and dry them in a dryer. ? Use blankets that are made of polyester or cotton. ? Clean bathrooms and kitchens with bleach. If possible, have someone repaint the walls in these rooms with mold-resistant paint. Keep out of the rooms that are being cleaned and painted. ? Wash hands frequently. Contact a health care provider if:  You have wheezing, shortness of breath, or a cough even if taking medicine to prevent attacks.  The colored mucus you cough up (sputum) is thicker than  usual.  Your sputum changes from clear or white to yellow, green, gray, or bloody.  You have any problems that may be related to the medicines you are taking (such as a rash, itching, swelling, or trouble breathing).  You are using a reliever medicine more than 2-3 times per week.  Your peak flow is still at 50-79% of your personal best after following your action plan for 1 hour.  You have a fever. Get help right away if:  You seem to be getting worse and are unresponsive to treatment during an asthma attack.  You are short of breath even at rest.  You get short of breath when doing very little physical activity.  You have difficulty eating, drinking, or talking due to asthma symptoms.  You develop chest pain.  You develop a fast heartbeat.  You have a bluish color to your lips or fingernails.  You are light-headed, dizzy, or faint.  Your peak flow is less than 50% of your personal best. This information is not intended to replace advice given to you by your health care provider. Make sure you discuss any questions you have with your health care provider. Document Released: 11/08/2005 Document Revised: 04/21/2016 Document Reviewed: 06/07/2013 Elsevier Interactive Patient Education  2017 [employer].  How to Take Your Blood Pressure You can take your blood pressure at home with a machine. You may need to check your blood pressure at home:  To check if you have high blood pressure (hypertension).  To check your blood pressure over time.  To make sure your blood pressure medicine is working.  Supplies needed: You will need a blood pressure machine, or monitor. You can buy one at a drugstore or online. When choosing one:  Choose one with an arm cuff.  Choose one that wraps around your upper arm. Only one finger should fit between your arm and the cuff.  Do not choose one that measures your blood pressure from your wrist or finger.  Your doctor can suggest a  monitor. How to prepare Avoid these things for 30 minutes before checking your blood pressure:  Drinking caffeine.  Drinking alcohol.  Eating.  Smoking.  Exercising.  Five minutes before checking your blood pressure:  Pee.  Sit in a dining chair. Avoid sitting in a soft couch or armchair.  Be quiet. Do not talk.  How to take your blood pressure Follow the instructions that came with your machine. If you have a digital blood pressure monitor, these may be the instructions: 1. Sit up straight. 2. Place your feet on the floor. Do not cross your ankles or legs. 3. Rest your left arm at the level of your heart. You may rest it on a table, desk, or chair. 4. Pull up your shirt sleeve. 5. Wrap the blood pressure cuff around the upper part of your left arm. The cuff should be 1 inch (2.5 cm) above your elbow. It is best to wrap the cuff around bare skin. 6. Fit the cuff snugly around your arm. You should be able to place only one finger between the cuff and your arm. 7. Put the cord inside the groove of your elbow. 8. Press the power button. 9. Sit quietly while the cuff fills with air and loses air. 10. Write down the numbers on the screen. 11. Wait 2-3 minutes and then repeat steps 1-10.  What do the numbers mean? Two numbers make up your blood pressure. The first number is called systolic pressure. The second is called diastolic pressure. An example of a blood pressure reading is "120 over 80" (or 120/80). If you are an adult and do not have a medical condition, use this guide to find out if your blood pressure is normal: Normal  First number: below 120.  Second number: below 80. Elevated  First number: 120-129.  Second number: below 80. Hypertension stage 1  First number: 130-139.  Second number: 80-89. Hypertension stage 2  First number: 140 or above.  Second number: 87 or above. Your blood pressure is above normal even if only the top or bottom number is above  normal. Follow these instructions at home:  Check your blood pressure as often as your doctor tells you to.  Take your monitor to your next doctor's appointment. Your doctor will: ? Make sure you are using it correctly. ? Make sure it is working right.  Make sure you understand what your blood pressure numbers should be.  Tell your doctor if your medicines are causing side effects. Contact a doctor if:  Your blood pressure keeps being high. Get help right away if:  Your first blood pressure number is higher than 180.  Your second blood pressure number is higher than 120. This information is not intended to replace advice  given to you by your health care provider. Make sure you discuss any questions you have with your health care provider. Document Released: 10/21/2008 Document Revised: 10/06/2016 Document Reviewed: 04/16/2016 Elsevier Interactive Patient Education  2018 [employer] 65 Years and Older, Male Preventive care refers to lifestyle choices and visits with your health care provider that can promote health and wellness. What does preventive care include?  A yearly physical exam. This is also called an annual well check.  Dental exams once or twice a year.  Routine eye exams. Ask your health care provider how often you should have your eyes checked.  Personal lifestyle choices, including: ? Daily care of your teeth and gums. ? Regular physical activity. ? Eating a healthy diet. ? Avoiding tobacco and drug use. ? Limiting alcohol use. ? Practicing safe sex. ? Taking low doses of aspirin every day. ? Taking vitamin and mineral supplements as recommended by your health care provider. What happens during an annual well check? The services and screenings done by your health care provider during your annual well check will depend on your age, overall health, lifestyle risk factors, and family history of disease. Counseling Your health care provider  may ask you questions about your:  Alcohol use.  Tobacco use.  Drug use.  Emotional well-being.  Home and relationship well-being.  Sexual activity.  Eating habits.  History of falls.  Memory and ability to understand (cognition).  Work and work Statistician.  Screening You may have the following tests or measurements:  Height, weight, and BMI.  Blood pressure.  Lipid and cholesterol levels. These may be checked every 5 years, or more frequently if you are over 74 years old.  Skin check.  Lung cancer screening. You may have this screening every year starting at age 84 if you have a 30-pack-year history of smoking and currently smoke or have quit within the past 15 years.  Fecal occult blood test (FOBT) of the stool. You may have this test every year starting at age 78.  Flexible sigmoidoscopy or colonoscopy. You may have a sigmoidoscopy every 5 years or a colonoscopy every 10 years starting at age 70.  Prostate cancer screening. Recommendations will vary depending on your family history and other risks.  Hepatitis C blood test.  Hepatitis B blood test.  Sexually transmitted disease (STD) testing.  Diabetes screening. This is done by checking your blood sugar (glucose) after you have not eaten for a while (fasting). You may have this done every 1-3 years.  Abdominal aortic aneurysm (AAA) screening. You may need this if you are a current or former smoker.  Osteoporosis. You may be screened starting at age 16 if you are at high risk.  Talk with your health care provider about your test results, treatment options, and if necessary, the need for more tests. Vaccines Your health care provider may recommend certain vaccines, such as:  Influenza vaccine. This is recommended every year.  Tetanus, diphtheria, and acellular pertussis (Tdap, Td) vaccine. You may need a Td booster every 10 years.  Varicella vaccine. You may need this if you have not been  vaccinated.  Zoster vaccine. You may need this after age 82.  Measles, mumps, and rubella (MMR) vaccine. You may need at least one dose of MMR if you were born in 1957 or later. You may also need a second dose.  Pneumococcal 13-valent conjugate (PCV13) vaccine. One dose is recommended after age 65.  Pneumococcal polysaccharide (PPSV23) vaccine. One  dose is recommended after age 46.  Meningococcal vaccine. You may need this if you have certain conditions.  Hepatitis A vaccine. You may need this if you have certain conditions or if you travel or work in places where you may be exposed to hepatitis A.  Hepatitis B vaccine. You may need this if you have certain conditions or if you travel or work in places where you may be exposed to hepatitis B.  Haemophilus influenzae type b (Hib) vaccine. You may need this if you have certain risk factors.  Talk to your health care provider about which screenings and vaccines you need and how often you need them. This information is not intended to replace advice given to you by your health care provider. Make sure you discuss any questions you have with your health care provider. Document Released: 12/05/2015 Document Revised: 07/28/2016 Document Reviewed: 09/09/2015 Elsevier Interactive Patient Education  [employer].   If you have lab work done today you will be contacted with your lab results within the next 2 weeks.  If you have not heard from [place] then please contact us. The fastest way to get your results is to register for My Chart.   IF you received an x-ray today, you will receive an invoice from [facility]. Please contact [facility] at [phone] with questions or concerns regarding your invoice.   IF you received labwork today, you will receive an invoice from [place]. Please contact [facility] at [phone] with questions or concerns regarding your invoice.   Our billing staff will not be able to assist you with  questions regarding bills from these companies.  You will be contacted with the lab results as soon as they are available. The fastest way to get your results is to activate your My Chart account. Instructions are located on the last page of this paperwork. If you have not heard from [place] regarding the results in 2 weeks, please contact this office.      Signed,   [name], MD [facility] [place].  10/07/18 10:45 PM

## 2018-10-04 LAB — COMPREHENSIVE METABOLIC PANEL
ALK PHOS: 79 IU/L (ref 39–117)
ALT: 16 IU/L (ref 0–44)
AST: 18 IU/L (ref 0–40)
Albumin/Globulin Ratio: 1.4 (ref 1.2–2.2)
Albumin: 4.5 g/dL (ref 3.6–4.8)
BUN/Creatinine Ratio: 9 — ABNORMAL LOW (ref 10–24)
BUN: 13 mg/dL (ref 8–27)
Bilirubin Total: 0.9 mg/dL (ref 0.0–1.2)
CALCIUM: 9.4 mg/dL (ref 8.6–10.2)
CO2: 24 mmol/L (ref 20–29)
CREATININE: 1.52 mg/dL — AB (ref 0.76–1.27)
Chloride: 105 mmol/L (ref 96–106)
GFR calc Af Amer: 53 mL/min/{1.73_m2} — ABNORMAL LOW (ref >59)
GFR calc non Af Amer: 46 mL/min/{1.73_m2} — ABNORMAL LOW (ref >59)
GLUCOSE: 94 mg/dL (ref 65–99)
Globulin, Total: 3.3 g/dL (ref 1.5–4.5)
Potassium: 4.7 mmol/L (ref 3.5–5.2)
Sodium: 141 mmol/L (ref 134–144)
TOTAL PROTEIN: 7.8 g/dL (ref 6.0–8.5)

## 2018-10-04 LAB — LIPID PANEL
CHOL/HDL RATIO: 3.5 ratio (ref 0.0–5.0)
Cholesterol, Total: 178 mg/dL (ref 100–199)
HDL: 51 mg/dL (ref >39)
LDL Calculated: 110 mg/dL — ABNORMAL HIGH (ref 0–99)
Triglycerides: 84 mg/dL (ref 0–149)
VLDL Cholesterol Cal: 17 mg/dL (ref 5–40)

## 2018-10-04 LAB — PSA: PROSTATE SPECIFIC AG, SERUM: 3.8 ng/mL (ref 0.0–4.0)

## 2019-06-23 ENCOUNTER — Other Ambulatory Visit: Payer: Self-pay | Admitting: Family Medicine

## 2019-06-23 NOTE — Telephone Encounter (Signed)
Forwarding medication refills request to clinical pool for review.

## 2019-06-29 ENCOUNTER — Other Ambulatory Visit: Payer: Self-pay | Admitting: Family Medicine

## 2019-06-29 NOTE — Telephone Encounter (Signed)
Requested medication (s) are due for refill today: yes  Requested medication (s) are on the active medication list: no  Last refill:  09/2018  Future visit scheduled: No  Notes to clinic:  Unable to refill per protocol. Unclear if pt should be on Losartan 100 mg or Losartan-hctz 100-12.5 mg.Pt was provided with emergency by the pharmacy until med refilled.     Requested Prescriptions  Pending Prescriptions Disp Refills   losartan-hydrochlorothiazide (HYZAAR) 100-12.5 MG tablet [Pharmacy Med Name: LOSARTAN-HCTZ 100-12.5 MG TAB] 90 tablet 3    Sig: TAKE 1 TABLET BY MOUTH EVERY DAY     Cardiovascular: ARB + Diuretic Combos Failed - 06/29/2019  9:54 AM      Failed - K in normal range and within 180 days    Potassium  Date Value Ref Range Status  10/03/2018 4.7 3.5 - 5.2 mmol/L Final         Failed - Na in normal range and within 180 days    Sodium  Date Value Ref Range Status  10/03/2018 141 134 - 144 mmol/L Final         Failed - Cr in normal range and within 180 days    Creat  Date Value Ref Range Status  09/30/2016 1.45 (H) 0.70 - 1.25 mg/dL Final    Comment:      For patients > or = 70 years of age: The upper reference limit for Creatinine is approximately 13% higher for people identified as African-American.      Creatinine, Ser  Date Value Ref Range Status  10/03/2018 1.52 (H) 0.76 - 1.27 mg/dL Final         Failed - Ca in normal range and within 180 days    Calcium  Date Value Ref Range Status  10/03/2018 9.4 8.6 - 10.2 mg/dL Final         Failed - Last BP in normal range    BP Readings from Last 1 Encounters:  10/03/18 (!) 142/92         Failed - Valid encounter within last 6 months    Recent Outpatient Visits          8 months ago Medicare annual wellness visit, subsequent   [facility], Ranell Patrick, MD   1 year ago Acute bronchitis, unspecified organism   [facility], [place] D, PA-C   1 year ago Pain of toe of  left foot   [facility] [place], PA-C   1 year ago Mild intermittent asthma, unspecified whether complicated   [facility], Ranell Patrick, MD   1 year ago Medicare annual wellness visit, subsequent   [facility], Ranell Patrick, MD             Passed - Patient is not pregnant

## 2019-07-02 ENCOUNTER — Encounter: Payer: Self-pay | Admitting: Family Medicine

## 2019-07-02 NOTE — Telephone Encounter (Signed)
Copied from [place] [id number]. Topic: General - Other >> Jul 02, 2019  8:14 AM Leward Quan A wrote: Reason for CRM: Patient called to ask for an update on the refill that he requested for his losartan (COZAAR) 100 MG tablet. Per patient he is all out of this medication. Asking for a few pills sent to his pharmacy. Please advise Ph# [phone]

## 2019-07-02 NOTE — Telephone Encounter (Signed)
No further refill without office visit °

## 2019-07-03 NOTE — Telephone Encounter (Signed)
Pt has been out of bp medication for 2 days. He requested a refill and has scheduled his next appt

## 2019-07-05 ENCOUNTER — Ambulatory Visit (INDEPENDENT_AMBULATORY_CARE_PROVIDER_SITE_OTHER): Payer: Medicare Other | Admitting: Family Medicine

## 2019-07-05 ENCOUNTER — Other Ambulatory Visit: Payer: Self-pay

## 2019-07-05 ENCOUNTER — Encounter: Payer: Self-pay | Admitting: Family Medicine

## 2019-07-05 VITALS — BP 142/90 | HR 95 | Temp 98.7°F | Resp 16 | Ht 68.0 in | Wt 162.0 lb

## 2019-07-05 DIAGNOSIS — I10 Essential (primary) hypertension: Secondary | ICD-10-CM

## 2019-07-05 DIAGNOSIS — J4520 Mild intermittent asthma, uncomplicated: Secondary | ICD-10-CM | POA: Diagnosis not present

## 2019-07-05 DIAGNOSIS — E785 Hyperlipidemia, unspecified: Secondary | ICD-10-CM

## 2019-07-05 MED ORDER — FLOVENT HFA 110 MCG/ACT IN AERO: 1.0000 | INHALATION_SPRAY | Freq: Two times a day (BID) | RESPIRATORY_TRACT | 5 refills | Status: DC

## 2019-07-05 MED ORDER — LOSARTAN POTASSIUM-HCTZ 100-12.5 MG PO TABS: 1.0000 | ORAL_TABLET | Freq: Every day | ORAL | 2 refills | Status: DC

## 2019-07-05 MED ORDER — ALBUTEROL SULFATE HFA 108 (90 BASE) MCG/ACT IN AERS: 1.0000 | INHALATION_SPRAY | RESPIRATORY_TRACT | 1 refills | Status: AC | PRN

## 2019-07-05 NOTE — Patient Instructions (Signed)
  Try just Flovent twice per day for asthma with albuterol as needed.  If you do require your albuterol more often, we can return to Advair.  Monitor your blood pressure at home and if it runs over 140/90 let me know.   Follow-up in 6 months.  I will let you know about cholesterol levels.  Return to the clinic or go to the nearest emergency room if any of your symptoms worsen or new symptoms occur.   If you have lab work done today you will be contacted with your lab results within the next 2 weeks.  If you have not heard from [place] then please contact us. The fastest way to get your results is to register for My Chart.   IF you received an x-ray today, you will receive an invoice from [facility]. Please contact [facility]iology at [phone] with questions or concerns regarding your invoice.   IF you received labwork today, you will receive an invoice from [place]. Please contact [facility] at [phone] with questions or concerns regarding your invoice.   Our billing staff will not be able to assist you with questions regarding bills from these companies.  You will be contacted with the lab results as soon as they are available. The fastest way to get your results is to activate your My Chart account. Instructions are located on the last page of this paperwork. If you have not heard from [place] regarding the results in 2 weeks, please contact this office.

## 2019-07-05 NOTE — Progress Notes (Signed)
Subjective:    Patient ID: Frank Howell, male    DOB: June 04, 1951, 68 y.o.   MRN: [id number]  HPI Frank Howell is a 68 y.o. male Presents today for: Chief Complaint  Patient presents with  . Medication Refill    BP    Hypertension: BP Readings from Last 3 Encounters:  07/05/19 (!) 142/90  10/03/18 (!) 142/92  06/14/18 137/89   Lab Results  Component Value Date   CREATININE 1.52 (H) 10/03/2018  losartan 100mg  QD in 09/2018.  Option of restart HCTZ if elevated home readings.  Taking losartan HCT 100/12.5mg  QD. Took one today, but out for a few days this week.  Home BP on meds- 130/84 Walking for exercise.    Hyperlipidemia:  Lab Results  Component Value Date   CHOL 178 10/03/2018   HDL 51 10/03/2018   LDLCALC 110 (H) 10/03/2018   TRIG 84 10/03/2018   CHOLHDL 3.5 10/03/2018   Lab Results  Component Value Date   ALT 16 10/03/2018   AST 18 10/03/2018   ALKPHOS 79 10/03/2018   BILITOT 0.9 10/03/2018  The 10-year ASCVD risk score Mikey Bussing DC Jr., et al., 2013) is: 21.9%   Values used to calculate the score:     Age: 89 years     Sex: Male     Is Non-Hispanic African American: Yes     Diabetic: No     Tobacco smoker: No     Systolic Blood Pressure: 637 mmHg     Is BP treated: Yes     HDL Cholesterol: 51 mg/dL     Total Cholesterol: 178 mg/dL meds discussed with patient in past, prior on Crestor - then diet approach.  Still would like to try diet approach, wants to see numbers before considering med.   Asthma: Has been avoiding alcohol usually, and sodas - helped.  No recent albuterol need. Feels like asthma controlled.  Ran out of Advair 3 weeks ago - was only taking as needed.    Patient Active Problem List   Diagnosis Date Noted  . Pain in the chest   . Chest pain 08/08/2015  . Psoriasis 08/08/2015  . Asthma 01/10/2012  . Hypertension 01/10/2012  . Hyperlipidemia 01/10/2012  . GERD 01/20/2010  . FECAL OCCULT BLOOD 01/20/2010   Past Medical History:   Diagnosis Date  . Abnormal liver function   . Allergy   . Asthma    per patient has not had asthma attack in five years  . Dermatitis   . GERD (gastroesophageal reflux disease)    patient stated GERD is no longer a problem for him  . Hyperlipidemia   . Hypertension   . Psoriasis    History reviewed. No pertinent surgical history. Allergies  Allergen Reactions  . Malarone [Atovaquone-Proguanil Hcl] Rash  . Penicillins    Prior to Admission medications   Medication Sig Start Date End Date Taking? Authorizing Provider  Adalimumab (HUMIRA) 40 MG/0.8ML PSKT Inject 40 mg into the skin every 30 (thirty) days.   Yes [provider]  augmented betamethasone dipropionate (DIPROLENE-AF) 8.58 % ointment APPLICATIONS APPLY ON THE SKIN APPLY TWICE DAILY TO LEGS 02/22/18  Yes [provider]  Fluticasone-Salmeterol (ADVAIR DISKUS) 100-50 MCG/DOSE AEPB TAKE 1 PUFF BY MOUTH TWICE A DAY 10/03/18  Yes [name], MD  losartan (COZAAR) 100 MG tablet Take 1 tablet (100 mg total) by mouth daily. 10/03/18  Yes [name], MD  losartan-hydrochlorothiazide (HYZAAR) 100-12.5 MG tablet TAKE 1 TABLET BY  MOUTH EVERY DAY 07/02/19  Yes [name], MD  albuterol (PROVENTIL HFA;VENTOLIN HFA) 108 (90 Base) MCG/ACT inhaler Inhale 1-2 puffs into the lungs every 4 (four) hours as needed for wheezing or shortness of breath. Patient not taking: Reported on 07/05/2019 10/03/18   [name], MD   Social History   Socioeconomic History  . Marital status: Married    Spouse name: Not on file  . Number of children: 3  . Years of education: Not on file  . Highest education level: Not on file  Occupational History  . Not on file  Social Needs  . Financial resource strain: Not on file  . Food insecurity    Worry: Not on file    Inability: Not on file  . Transportation needs    Medical: Not on file    Non-medical: Not on file  Tobacco Use  . Smoking status: Never Smoker  .  Smokeless tobacco: Never Used  Substance and Sexual Activity  . Alcohol use: Yes    Alcohol/week: 0.0 standard drinks    Comment: occasionally  . Drug use: No  . Sexual activity: Yes  Lifestyle  . Physical activity    Days per week: Not on file    Minutes per session: Not on file  . Stress: Not on file  Relationships  . Social Herbalist on phone: Not on file    Gets together: Not on file    Attends religious service: Not on file    Active member of club or organization: Not on file    Attends meetings of clubs or organizations: Not on file    Relationship status: Not on file  . Intimate partner violence    Fear of current or ex partner: Not on file    Emotionally abused: Not on file    Physically abused: Not on file    Forced sexual activity: Not on file  Other Topics Concern  . 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  Eyes: Negative for visual disturbance.  Respiratory: Negative for cough, chest tightness and shortness of breath.   Cardiovascular: Negative for chest pain, palpitations and leg swelling.  Gastrointestinal: Negative for abdominal pain and blood in stool.  Neurological: Negative for dizziness, light-headedness and headaches.       Objective:   Physical Exam Vitals signs reviewed.  Constitutional:      Appearance: He is well-developed.  HENT:     Head: Normocephalic and atraumatic.  Eyes:     Pupils: Pupils are equal, round, and reactive to light.  Neck:     Vascular: No carotid bruit or JVD.  Cardiovascular:     Rate and Rhythm: Normal rate and regular rhythm.     Heart sounds: Normal heart sounds. No murmur.  Pulmonary:     Effort: Pulmonary effort is normal.     Breath sounds: Normal breath sounds. No  wheezing or rales.  Skin:    General: Skin is warm and dry.  Neurological:     Mental Status: He is alert and oriented to person, place, and time.    Vitals:   07/05/19 1339  BP: (!) 142/90  Pulse: 95  Resp: 16  Temp: 98.7  F (37.1 C)  TempSrc: Oral  SpO2: 98%  Weight: 162 lb (73.5 kg)  Height: 5\' 8"  (1.727 m)       Assessment & Plan:   Frank Howell is a 68 y.o. male Essential hypertension - Plan: losartan-hydrochlorothiazide (HYZAAR) 100-12.5 MG tablet, Comprehensive metabolic panel  - tolerating current regimen. Medications refilled. Labs pending as above. rtc precautions if elevated at home.   Mild intermittent asthma, unspecified whether complicated - Plan: albuterol (VENTOLIN HFA) 108 (90 Base) MCG/ACT inhaler, fluticasone (FLOVENT HFA) 110 MCG/ACT inhaler  - well controlled, trial of just flovent hfa, with albuterol if needed. rtc precautions if decreased control.   Hyperlipidemia, unspecified hyperlipidemia type - Plan: Comprehensive metabolic panel, Lipid panel  - check labs first, consider statin but would prefer diet approach.    Meds ordered this encounter  Medications  . albuterol (VENTOLIN HFA) 108 (90 Base) MCG/ACT inhaler    Sig: Inhale 1-2 puffs into the lungs every 4 (four) hours as needed for wheezing or shortness of breath.    Dispense:  18 g    Refill:  1  . fluticasone (FLOVENT HFA) 110 MCG/ACT inhaler    Sig: Inhale 1 puff into the lungs 2 (two) times daily.    Dispense:  1 Inhaler    Refill:  5  . losartan-hydrochlorothiazide (HYZAAR) 100-12.5 MG tablet    Sig: Take 1 tablet by mouth daily.    Dispense:  90 tablet    Refill:  2    DX Code Needed  EMERGENCY HOLDOVER PROVIDED: KA#[id number]. RPH GAVE 3 LOSARTAN-HCTZ 100-12.5 MG TAB. MD CONTACTED FOR AUTHORIZATION ON 06/29/2019.   Patient Instructions    Try just Flovent twice per day for asthma with albuterol as needed.  If you do require your albuterol more often, we can return to Advair.   Monitor your blood pressure at home and if it runs over 140/90 let me know.   Follow-up in 6 months.  I will let you know about cholesterol levels.  Return to the clinic or go to the nearest emergency room if any of your symptoms worsen or new symptoms occur.   If you have lab work done today you will be contacted with your lab results within the next 2 weeks.  If you have not heard from [place] then please contact us. The fastest way to get your results is to register for My Chart.   IF you received an x-ray today, you will receive an invoice from [facility]. Please contact [facility]iology at [phone] with questions or concerns regarding your invoice.   IF you received labwork today, you will receive an invoice from [place]. Please contact [facility] at [phone] with questions or concerns regarding your invoice.   Our billing staff will not be able to assist you with questions regarding bills from these companies.  You will be contacted with the lab results as soon as they are available. The fastest way to get your results is to activate your My Chart account. Instructions are located on the last page of this paperwork. If you have not heard from [place] regarding the results in 2 weeks, please contact this office.       Signed,   [name], MD [facility] [place].  07/07/19 3:56 PM

## 2019-07-06 LAB — COMPREHENSIVE METABOLIC PANEL
ALT: 11 IU/L (ref 0–44)
AST: 18 IU/L (ref 0–40)
Albumin/Globulin Ratio: 1.5 (ref 1.2–2.2)
Albumin: 4.1 g/dL (ref 3.8–4.8)
Alkaline Phosphatase: 66 IU/L (ref 39–117)
BUN/Creatinine Ratio: 10 (ref 10–24)
BUN: 16 mg/dL (ref 8–27)
Bilirubin Total: 1.2 mg/dL (ref 0.0–1.2)
CO2: 24 mmol/L (ref 20–29)
Calcium: 9.2 mg/dL (ref 8.6–10.2)
Chloride: 102 mmol/L (ref 96–106)
Creatinine, Ser: 1.6 mg/dL — ABNORMAL HIGH (ref 0.76–1.27)
GFR calc Af Amer: 50 mL/min/{1.73_m2} — ABNORMAL LOW (ref >59)
GFR calc non Af Amer: 43 mL/min/{1.73_m2} — ABNORMAL LOW (ref >59)
Globulin, Total: 2.8 g/dL (ref 1.5–4.5)
Glucose: 78 mg/dL (ref 65–99)
Potassium: 3.7 mmol/L (ref 3.5–5.2)
Sodium: 140 mmol/L (ref 134–144)
Total Protein: 6.9 g/dL (ref 6.0–8.5)

## 2019-07-06 LAB — LIPID PANEL
Chol/HDL Ratio: 3.1 ratio (ref 0.0–5.0)
Cholesterol, Total: 171 mg/dL (ref 100–199)
HDL: 55 mg/dL (ref >39)
LDL Calculated: 101 mg/dL — ABNORMAL HIGH (ref 0–99)
Triglycerides: 73 mg/dL (ref 0–149)
VLDL Cholesterol Cal: 15 mg/dL (ref 5–40)

## 2019-07-07 ENCOUNTER — Encounter: Payer: Self-pay | Admitting: Family Medicine

## 2019-08-08 ENCOUNTER — Other Ambulatory Visit: Payer: Self-pay

## 2019-08-08 ENCOUNTER — Ambulatory Visit: Payer: Medicare Other

## 2019-08-15 ENCOUNTER — Ambulatory Visit (INDEPENDENT_AMBULATORY_CARE_PROVIDER_SITE_OTHER): Payer: Medicare Other | Admitting: Family Medicine

## 2019-08-15 ENCOUNTER — Other Ambulatory Visit: Payer: Self-pay

## 2019-08-15 DIAGNOSIS — Z23 Encounter for immunization: Secondary | ICD-10-CM

## 2019-11-07 ENCOUNTER — Ambulatory Visit (INDEPENDENT_AMBULATORY_CARE_PROVIDER_SITE_OTHER): Payer: Medicare Other | Admitting: Family Medicine

## 2019-11-07 VITALS — BP 142/90 | Ht 68.0 in | Wt 162.0 lb

## 2019-11-07 DIAGNOSIS — Z0000 Encounter for general adult medical examination without abnormal findings: Secondary | ICD-10-CM

## 2019-11-07 NOTE — Patient Instructions (Signed)
Thank you for taking time to come for your Medicare Wellness Visit. I appreciate your ongoing commitment to your health goals. Please review the following plan we discussed and let me know if I can assist you in the future.  Frank Kennedy LPN  Preventive Care 70 Years and Older, Male Preventive care refers to lifestyle choices and visits with your health care provider that can promote health and wellness. This includes:  A yearly physical exam. This is also called an annual well check.  Regular dental and eye exams.  Immunizations.  Screening for certain conditions.  Healthy lifestyle choices, such as diet and exercise. What can I expect for my preventive care visit? Physical exam Your health care provider will check:  Height and weight. These may be used to calculate body mass index (BMI), which is a measurement that tells if you are at a healthy weight.  Heart rate and blood pressure.  Your skin for abnormal spots. Counseling Your health care provider may ask you questions about:  Alcohol, tobacco, and drug use.  Emotional well-being.  Home and relationship well-being.  Sexual activity.  Eating habits.  History of falls.  Memory and ability to understand (cognition).  Work and work Statistician. What immunizations do I need?  Influenza (flu) vaccine  This is recommended every year. Tetanus, diphtheria, and pertussis (Tdap) vaccine  You may need a Td booster every 10 years. Varicella (chickenpox) vaccine  You may need this vaccine if you have not already been vaccinated. Zoster (shingles) vaccine  You may need this after age 70. Pneumococcal conjugate (PCV13) vaccine  One dose is recommended after age 70. Pneumococcal polysaccharide (PPSV23) vaccine  One dose is recommended after age 70. Measles, mumps, and rubella (MMR) vaccine  You may need at least one dose of MMR if you were born in 1957 or later. You may also need a second dose. Meningococcal  conjugate (MenACWY) vaccine  You may need this if you have certain conditions. Hepatitis A vaccine  You may need this if you have certain conditions or if you travel or work in places where you may be exposed to hepatitis A. Hepatitis B vaccine  You may need this if you have certain conditions or if you travel or work in places where you may be exposed to hepatitis B. Haemophilus influenzae type b (Hib) vaccine  You may need this if you have certain conditions. You may receive vaccines as individual doses or as more than one vaccine together in one shot (combination vaccines). Talk with your health care provider about the risks and benefits of combination vaccines. What tests do I need? Blood tests  Lipid and cholesterol levels. These may be checked every 5 years, or more frequently depending on your overall health.  Hepatitis C test.  Hepatitis B test. Screening  Lung cancer screening. You may have this screening every year starting at age 24 if you have a 30-pack-year history of smoking and currently smoke or have quit within the past 15 years.  Colorectal cancer screening. All adults should have this screening starting at age 52 and continuing until age 61. Your health care provider may recommend screening at age 57 if you are at increased risk. You will have tests every 1-10 years, depending on your results and the type of screening test.  Prostate cancer screening. Recommendations will vary depending on your family history and other risks.  Diabetes screening. This is done by checking your blood sugar (glucose) after you have not eaten for  a while (fasting). You may have this done every 1-3 years.  Abdominal aortic aneurysm (AAA) screening. You may need this if you are a current or former smoker.  Sexually transmitted disease (STD) testing. Follow these instructions at home: Eating and drinking  Eat a diet that includes fresh fruits and vegetables, whole grains, lean  protein, and low-fat dairy products. Limit your intake of foods with high amounts of sugar, saturated fats, and salt.  Take vitamin and mineral supplements as recommended by your health care provider.  Do not drink alcohol if your health care provider tells you not to drink.  If you drink alcohol: ? Limit how much you have to 0-2 drinks a day. ? Be aware of how much alcohol is in your drink. In the [place] one drink equals one 12 oz bottle of beer (355 mL), one 5 oz glass of wine (148 mL), or one 1 oz glass of hard liquor (44 mL). Lifestyle  Take daily care of your teeth and gums.  Stay active. Exercise for at least 30 minutes on 5 or more days each week.  Do not use any products that contain nicotine or tobacco, such as cigarettes, e-cigarettes, and chewing tobacco. If you need help quitting, ask your health care provider.  If you are sexually active, practice safe sex. Use a condom or other form of protection to prevent STIs (sexually transmitted infections).  Talk with your health care provider about taking a low-dose aspirin or statin. What's next?  Visit your health care provider once a year for a well check visit.  Ask your health care provider how often you should have your eyes and teeth checked.  Stay up to date on all vaccines. This information is not intended to replace advice given to you by your health care provider. Make sure you discuss any questions you have with your health care provider. Document Released: 12/05/2015 Document Revised: 11/02/2018 Document Reviewed: 11/02/2018 Elsevier Patient Education  2020 [employer].

## 2019-11-07 NOTE — Progress Notes (Signed)
Presents today for [employer] Visit   Date of last exam:10-03-2018  Interpreter used for this visit? No  I connected with  Frank Howell on 11/07/19 by a telephone application and verified that I am speaking with the correct person using two identifiers.   I discussed the limitations of evaluation and management by telemedicine. The patient expressed understanding and agreed to proceed.   Patient Care Team: [name], MD as PCP - General (Family Medicine) Gatha Mayer, MD as Consulting Physician (Gastroenterology)   Other items to address today:  Discussed immunizations Has a follow up 01-03-2019 with discuss PNA Discussed Eye/Dental    Other Screening:  Last lipid screening:07-05-2019   ADVANCE DIRECTIVES: Discussed: yes On File: no copy requested Materials Provided: no  Immunization status:  Immunization History  Administered Date(s) Administered  . Fluad Quad(high Dose 65+) 08/15/2019  . Influenza,inj,Quad PF,6+ Mos 08/09/2015, 09/06/2017, 10/03/2018  . Pneumococcal Conjugate-13 04/29/2016  . Pneumococcal Polysaccharide-23 03/27/2012  . Tdap 11/19/2009     Health Maintenance Due  Topic Date Due  . PNA vac Low Risk Adult (2 of 2 - PPSV23) 04/29/2017     Functional Status Survey: Is the patient deaf or have difficulty hearing?: No Does the patient have difficulty seeing, even when wearing glasses/contacts?: No Does the patient have difficulty concentrating, remembering, or making decisions?: No Does the patient have difficulty walking or climbing stairs?: No Does the patient have difficulty dressing or bathing?: No   6CIT Screen 11/07/2019 10/03/2018  What Year? 0 points 0 points  What month? 0 points 0 points  What time? 0 points 0 points  Count back from 20 0 points 0 points  Months in reverse - 0 points  Repeat phrase 0 points 0 points  Total Score - 0         Home Environment  :Lives in two story home No  scattered rugs No grab bars Adequate lighting/no clutter  Timed warm up N/A     Patient Active Problem List   Diagnosis Date Noted  . Pain in the chest   . Chest pain 08/08/2015  . Psoriasis 08/08/2015  . Asthma 01/10/2012  . Hypertension 01/10/2012  . Hyperlipidemia 01/10/2012  . GERD 01/20/2010  . FECAL OCCULT BLOOD 01/20/2010     Past Medical History:  Diagnosis Date  . Abnormal liver function   . Allergy   . Asthma    per patient has not had asthma attack in five years  . Dermatitis   . GERD (gastroesophageal reflux disease)    patient stated GERD is no longer a problem for him  . Hyperlipidemia   . Hypertension   . Psoriasis      History reviewed. No pertinent surgical history.   Family History  Problem Relation Age of Onset  . Asthma Mother   . Hypertension Sister   . Hypertension Brother      Social History   Socioeconomic History  . Marital status: Married    Spouse name: Not on file  . Number of children: 3  . Years of education: Not on file  . Highest education level: Not on file  Occupational History  . Not on file  Tobacco Use  . Smoking status: Never Smoker  . Smokeless tobacco: Never Used  Substance and Sexual Activity  . Alcohol use: Yes    Alcohol/week: 0.0 standard drinks    Comment: occasionally  . Drug use: No  . Sexual activity: Yes  Other  Topics Concern  . 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 Difficulty of Paying Living Expenses: Not on file  Food Insecurity:   . Worried About Charity fundraiser in the Last Year: Not on file  . Ran Out of Food in the Last Year: Not on file  Transportation Needs:   . Lack of Transportation (Medical): Not on file  . Lack of Transportation (Non-Medical): Not on file  Physical  Activity:   . Days of Exercise per Week: Not on file  . Minutes of Exercise per Session: Not on file  Stress:   . Feeling of Stress : Not on file  Social Connections:   . Frequency of Communication with Friends and Family: Not on file  . Frequency of Social Gatherings with Friends and Family: Not on file  . Attends Religious Services: Not on file  . Active Member of Clubs or Organizations: Not on file  . Attends Archivist Meetings: Not on file  . Marital Status: Not on file  Intimate Partner Violence:   . Fear of Current or Ex-Partner: Not on file  . Emotionally Abused: Not on file  . Physically Abused: Not on file  . Sexually Abused: Not on file     Allergies  Allergen Reactions  . Malarone [Atovaquone-Proguanil Hcl] Rash  . Penicillins      Prior to Admission medications   Medication Sig Start Date End Date Taking? Authorizing Provider  albuterol (VENTOLIN HFA) 108 (90 Base) MCG/ACT inhaler Inhale 1-2 puffs into the lungs every 4 (four) hours as needed for wheezing or shortness of breath. 07/05/19  Yes [name], MD  augmented betamethasone dipropionate (DIPROLENE-AF) [id number] % ointment APPLICATIONS APPLY ON THE SKIN APPLY TWICE DAILY TO LEGS 02/22/18  Yes [provider]  cholecalciferol (VITAMIN D3) 25 MCG (1000 UT) tablet Take 1,000 Units by mouth daily.   Yes [provider]  fluticasone (FLOVENT HFA) 110 MCG/ACT inhaler Inhale 1 puff into the lungs 2 (two) times daily. 07/05/19  Yes [name], MD  losartan-hydrochlorothiazide (HYZAAR) 100-12.5 MG tablet Take 1 tablet by mouth daily. 07/05/19  Yes [name], MD  vitamin C (ASCORBIC ACID) 250 MG tablet Take 500 mg by mouth daily.   Yes [provider]  Adalimumab (HUMIRA) 40 MG/0.8ML PSKT Inject 40 mg into the skin every 30 (thirty) days.    [provider]     Depression screen [facility] 2/9 11/07/2019 07/05/2019 07/05/2019 10/03/2018 06/14/2018  Decreased Interest 0  0 0 0 0  Down, Depressed, Hopeless - 0 0 0 0  PHQ - 2 Score 0 0 0 0 0     Fall Risk  11/07/2019 07/05/2019 10/03/2018 06/14/2018 03/27/2018  Falls in the past year? 0 0 0 No No  Number falls in past yr: 0 0 - - -  Injury with Fall? 0 0 - - -  Risk for fall due to : History of fall(s) - - - -  Follow up Falls evaluation completed;Education provided - - - -      PHYSICAL EXAM: BP (!) 142/90 Comment: take from previous visit  Ht 5\' 8"  (1.727 m)   Wt 162 lb (73.5 kg)   BMI 24.63 kg/m  Wt Readings from Last 3 Encounters:  11/07/19 162 lb (73.5 kg)  07/05/19 162 lb (73.5 kg)  10/03/18 162 lb 12.8 oz (73.8 kg)  Medicare annual wellness visit, subsequent     Education/Counseling provided regarding diet and exercise, prevention of chronic diseases, smoking/tobacco cessation, if applicable, and reviewed "Covered Medicare Preventive Services."

## 2019-12-06 DIAGNOSIS — L400 Psoriasis vulgaris: Secondary | ICD-10-CM | POA: Diagnosis not present

## 2020-01-01 ENCOUNTER — Ambulatory Visit: Payer: Medicare PPO | Attending: Internal Medicine

## 2020-01-01 DIAGNOSIS — Z23 Encounter for immunization: Secondary | ICD-10-CM

## 2020-01-01 NOTE — Progress Notes (Signed)
   Covid-19 Vaccination Clinic  Name:  Frank Howell    MRN: [id number] DOB: November 17, 1951  01/01/2020  [name] was observed post Covid-19 immunization for 15 minutes without incidence. He was provided with Vaccine Information Sheet and instruction to access the V-Safe system.   [name] was instructed to call 911 with any severe reactions post vaccine: Marland Kitchen Difficulty breathing  . Swelling of your face and throat  . A fast heartbeat  . A bad rash all over your body  . Dizziness and weakness    Immunizations Administered    Name Date Dose VIS Date Route   Pfizer COVID-19 Vaccine 01/01/2020  2:06 PM 0.3 mL 11/02/2019 Intramuscular   Manufacturer: [place]   Lot: [id number]   [place]: [id number]

## 2020-01-04 ENCOUNTER — Ambulatory Visit (INDEPENDENT_AMBULATORY_CARE_PROVIDER_SITE_OTHER): Payer: Medicare PPO | Admitting: Family Medicine

## 2020-01-04 ENCOUNTER — Encounter: Payer: Self-pay | Admitting: Family Medicine

## 2020-01-04 ENCOUNTER — Other Ambulatory Visit: Payer: Self-pay

## 2020-01-04 VITALS — BP 132/82 | HR 98 | Temp 97.2°F | Ht 68.0 in | Wt 155.0 lb

## 2020-01-04 DIAGNOSIS — N1831 Chronic kidney disease, stage 3a: Secondary | ICD-10-CM | POA: Diagnosis not present

## 2020-01-04 DIAGNOSIS — I10 Essential (primary) hypertension: Secondary | ICD-10-CM | POA: Diagnosis not present

## 2020-01-04 DIAGNOSIS — R634 Abnormal weight loss: Secondary | ICD-10-CM

## 2020-01-04 DIAGNOSIS — J4520 Mild intermittent asthma, uncomplicated: Secondary | ICD-10-CM | POA: Diagnosis not present

## 2020-01-04 MED ORDER — FLOVENT HFA 110 MCG/ACT IN AERO: 1.0000 | INHALATION_SPRAY | Freq: Two times a day (BID) | RESPIRATORY_TRACT | 5 refills | Status: DC

## 2020-01-04 MED ORDER — LOSARTAN POTASSIUM-HCTZ 100-12.5 MG PO TABS: 1.0000 | ORAL_TABLET | Freq: Every day | ORAL | 2 refills | Status: DC

## 2020-01-04 NOTE — Patient Instructions (Addendum)
Weight loss may be due to increased exercise and improved diet, but watch those numbers at home and follow-up in the next 4 to 6 weeks if he noticed continuous decline or new symptoms.  Blood pressure borderline, ideally would like to see that below 130/80, monitor those readings at home and let me know if there are concerns.  Recheck in 6 months.  Make sure to drink plenty of water throughout the day, avoid NSAIDs like ibuprofen or Aleve with chronic kidney disease.  Continue Flovent as needed, albuterol if flare of asthma symptoms.  If you do require albuterol more than once or twice a week or any nighttime symptoms, take Flovent daily.  Thank you for coming in today, take care.     If you have lab work done today you will be contacted with your lab results within the next 2 weeks.  If you have not heard from [place] then please contact us. The fastest way to get your results is to register for My Chart.   IF you received an x-ray today, you will receive an invoice from [facility]. Please contact [facility] at [phone] with questions or concerns regarding your invoice.   IF you received labwork today, you will receive an invoice from [facility]. Please contact [facility] at [phone] with questions or concerns regarding your invoice.   Our billing staff will not be able to assist you with questions regarding bills from these companies.  You will be contacted with the lab results as soon as they are available. The fastest way to get your results is to activate your My Chart account. Instructions are located on the last page of this paperwork. If you have not heard from [place] regarding the results in 2 weeks, please contact this office.

## 2020-01-04 NOTE — Progress Notes (Signed)
Subjective:  Patient ID: Frank Howell, male    DOB: 09/07/1951  Age: 69 y.o. MRN: [id number]  CC:  Chief Complaint  Patient presents with  . Follow-up    on hypertension. pt states he feel good today no changes since last visit. pt has no physical symptoms of hypertension.pt states medications seem to be working well with no side effects. pt is concered about the 7 lb he has lost since his last visit.    HPI Frank Howell presents for   Hypertension: Borderline at last visit.  Continued losartan  HCTZ 100/12.5 mg daily Home readings:  No recent readings.  CKD 3 - creat range since 2016 - 1.45 to 1.6.  No nsaids . BP Readings from Last 3 Encounters:  01/04/20 132/82  11/07/19 (!) 142/90  07/05/19 (!) 142/90   Lab Results  Component Value Date   CREATININE 1.60 (H) 07/05/2019  has been exercising more - increased walking, eating better since last visit.  On Humira prior - otesla for psoriasis.  Has had some weight loss - but increased exercise. No fever, bone pain, night sweats.  No hx of cancer.  No  Hematuria, no new symptoms.  Wt Readings from Last 3 Encounters:  01/04/20 155 lb (70.3 kg)  11/07/19 162 lb (73.5 kg)  07/05/19 162 lb (73.5 kg)      Asthma: -The Flovent twice per day for asthma with albuterol as needed discussed at August 2020 visit. flovent as needed - used last night No recent need of albuterol.    1st covid vaccine last week  History Patient Active Problem List   Diagnosis Date Noted  . Pain in the chest   . Chest pain 08/08/2015  . Psoriasis 08/08/2015  . Asthma 01/10/2012  . Hypertension 01/10/2012  . Hyperlipidemia 01/10/2012  . GERD 01/20/2010  . FECAL OCCULT BLOOD 01/20/2010   Past Medical History:  Diagnosis Date  . Abnormal liver function   . Allergy   . Asthma    per patient has not had asthma attack in five years  . Dermatitis   . GERD (gastroesophageal reflux disease)    patient stated GERD is no longer a problem for  him  . Hyperlipidemia   . Hypertension   . Psoriasis    No past surgical history on file. Allergies  Allergen Reactions  . Malarone [Atovaquone-Proguanil Hcl] Rash  . Penicillins    Prior to Admission medications   Medication Sig Start Date End Date Taking? Authorizing Provider  Adalimumab (HUMIRA) 40 MG/0.8ML PSKT Inject 40 mg into the skin every 30 (thirty) days.   Yes [provider]  albuterol (VENTOLIN HFA) 108 (90 Base) MCG/ACT inhaler Inhale 1-2 puffs into the lungs every 4 (four) hours as needed for wheezing or shortness of breath. 07/05/19  Yes [name], MD  augmented betamethasone dipropionate (DIPROLENE-AF) [id number] % ointment APPLICATIONS APPLY ON THE SKIN APPLY TWICE DAILY TO LEGS 02/22/18  Yes [provider]  cholecalciferol (VITAMIN D3) 25 MCG (1000 UT) tablet Take 1,000 Units by mouth daily.   Yes [provider]  fluticasone (FLOVENT HFA) 110 MCG/ACT inhaler Inhale 1 puff into the lungs 2 (two) times daily. 07/05/19  Yes [name], MD  losartan-hydrochlorothiazide (HYZAAR) 100-12.5 MG tablet Take 1 tablet by mouth daily. 07/05/19  Yes [name], MD  vitamin C (ASCORBIC ACID) 250 MG tablet Take 500 mg by mouth daily.   Yes [provider]   Social History   Socioeconomic  History  . Marital status: Married    Spouse name: Not on file  . Number of children: 3  . Years of education: Not on file  . Highest education level: Not on file  Occupational History  . Not on file  Tobacco Use  . Smoking status: Never Smoker  . Smokeless tobacco: Never Used  Substance and Sexual Activity  . Alcohol use: Yes    Alcohol/week: 0.0 standard drinks    Comment: occasionally  . Drug use: No  . Sexual activity: Yes  Other Topics Concern  . 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 Difficulty of Paying Living Expenses: Not on file  Food Insecurity:   . Worried About Charity fundraiser in the Last Year: Not on file  . Ran Out of Food in the Last Year: Not on file  Transportation Needs:   . Lack of Transportation (Medical): Not on file  . Lack of Transportation (Non-Medical): Not on file  Physical Activity:   . Days of Exercise per Week: Not on file  . Minutes of Exercise per Session: Not on file  Stress:   . Feeling of Stress : Not on file  Social Connections:   . Frequency of Communication with Friends and Family: Not on file  . Frequency of Social Gatherings with Friends and Family: Not on file  . Attends Religious Services: Not on file  . Active Member of Clubs or Organizations: Not on file  . Attends Archivist Meetings: Not on file  . Marital Status: Not on file  Intimate Partner Violence:   . Fear of Current or Ex-Partner: Not on file  . Emotionally Abused: Not on file  . Physically Abused: Not on file  . Sexually Abused: Not on file    Review of Systems  Constitutional: Negative for fatigue and unexpected weight change.  Eyes: Negative for visual disturbance.  Respiratory: Negative for cough, chest tightness and shortness of breath.   Cardiovascular: Negative for chest pain, palpitations and leg swelling.  Gastrointestinal: Negative for abdominal pain and blood in stool.  Neurological: Negative for dizziness, light-headedness and headaches.     Objective:   Vitals:   01/04/20 1514 01/04/20 1516  BP: (!) 164/104 132/82  Pulse: 98   Temp: (!) 97.2 F (36.2 C)   TempSrc: Temporal   SpO2: 99%   Weight: 155 lb (70.3 kg)   Height: 5\' 8"  (1.727 m)      Physical Exam Vitals reviewed.  Constitutional:      Appearance: He is well-developed.  HENT:     Head: Normocephalic and atraumatic.  Eyes:     Pupils: Pupils are equal, round, and reactive to light.  Neck:      Vascular: No carotid bruit or JVD.  Cardiovascular:     Rate and Rhythm: Normal rate and regular rhythm.     Heart sounds: Normal heart sounds. No murmur.  Pulmonary:     Effort: Pulmonary effort is normal.     Breath sounds: Normal breath sounds. No rales.  Skin:    General: Skin is warm and dry.  Neurological:     Mental Status: He is alert and oriented to person, place, and time.  Assessment & Plan:  Frank Howell is a 69 y.o. male . Essential hypertension - Plan: losartan-hydrochlorothiazide (HYZAAR) 100-12.5 MG tablet, Basic metabolic panel  -  Stable, tolerating current regimen. Medications refilled. Labs pending as above.   Mild intermittent asthma, unspecified whether complicated - Plan: fluticasone (FLOVENT HFA) 110 MCG/ACT inhaler  -  Stable, tolerating current regimen. Medications refilled.  Dosing options of Flovent discussed, including need for daily use if frequent albuterol need, nighttime asthma symptoms.  Stage 3a chronic kidney disease - Plan: Basic metabolic panel  -Avoid nephrotoxins including NSAIDs, maintain hydration, recheck levels.  Monitor BP control.  Loss of weight - Plan: Basic metabolic panel  -Thought to be due to improve diet, exercise, monitor for continued losses or new symptoms.  Up-to-date on colonoscopy.  RTC precautions.  No orders of the defined types were placed in this encounter.  Patient Instructions       If you have lab work done today you will be contacted with your lab results within the next 2 weeks.  If you have not heard from [place] then please contact us. The fastest way to get your results is to register for My Chart.   IF you received an x-ray today, you will receive an invoice from [facility]. Please contact [facility] at [phone] with questions or concerns regarding your invoice.   IF you received labwork today, you will receive an invoice from [place]. Please contact [facility] at  [phone] with questions or concerns regarding your invoice.   Our billing staff will not be able to assist you with questions regarding bills from these companies.  You will be contacted with the lab results as soon as they are available. The fastest way to get your results is to activate your My Chart account. Instructions are located on the last page of this paperwork. If you have not heard from [place] regarding the results in 2 weeks, please contact this office.         Signed, [name], MD [facility]

## 2020-01-05 LAB — BASIC METABOLIC PANEL
BUN/Creatinine Ratio: 11 (ref 10–24)
BUN: 16 mg/dL (ref 8–27)
CO2: 26 mmol/L (ref 20–29)
Calcium: 9.4 mg/dL (ref 8.6–10.2)
Chloride: 102 mmol/L (ref 96–106)
Creatinine, Ser: 1.45 mg/dL — ABNORMAL HIGH (ref 0.76–1.27)
GFR calc Af Amer: 56 mL/min/{1.73_m2} — ABNORMAL LOW (ref >59)
GFR calc non Af Amer: 48 mL/min/{1.73_m2} — ABNORMAL LOW (ref >59)
Glucose: 90 mg/dL (ref 65–99)
Potassium: 4.4 mmol/L (ref 3.5–5.2)
Sodium: 142 mmol/L (ref 134–144)

## 2020-01-08 ENCOUNTER — Ambulatory Visit: Payer: Medicare Other | Admitting: Family Medicine

## 2020-01-26 ENCOUNTER — Ambulatory Visit: Payer: Medicare PPO | Attending: Internal Medicine

## 2020-01-26 DIAGNOSIS — Z23 Encounter for immunization: Secondary | ICD-10-CM

## 2020-01-26 NOTE — Progress Notes (Signed)
   Covid-19 Vaccination Clinic  Name:  Frank Howell    MRN: [id number] DOB: 12-13-1950  01/26/2020  Frank Howell was observed post Covid-19 immunization for 15 minutes without incident. He was provided with Vaccine Information Sheet and instruction to access the V-Safe system.   Frank Howell was instructed to call 911 with any severe reactions post vaccine: Marland Kitchen Difficulty breathing  . Swelling of face and throat  . A fast heartbeat  . A bad rash all over body  . Dizziness and weakness   Immunizations Administered    Name Date Dose VIS Date Route   Pfizer COVID-19 Vaccine 01/26/2020  2:30 PM 0.3 mL 11/02/2019 Intramuscular   Manufacturer: [place]   Lot: [id number]   [place]: [id number]

## 2020-02-04 DIAGNOSIS — M9901 Segmental and somatic dysfunction of cervical region: Secondary | ICD-10-CM | POA: Diagnosis not present

## 2020-02-04 DIAGNOSIS — M5032 Other cervical disc degeneration, mid-cervical region, unspecified level: Secondary | ICD-10-CM | POA: Diagnosis not present

## 2020-02-04 DIAGNOSIS — M5414 Radiculopathy, thoracic region: Secondary | ICD-10-CM | POA: Diagnosis not present

## 2020-02-04 DIAGNOSIS — M9902 Segmental and somatic dysfunction of thoracic region: Secondary | ICD-10-CM | POA: Diagnosis not present

## 2020-02-06 DIAGNOSIS — M5414 Radiculopathy, thoracic region: Secondary | ICD-10-CM | POA: Diagnosis not present

## 2020-02-06 DIAGNOSIS — M9902 Segmental and somatic dysfunction of thoracic region: Secondary | ICD-10-CM | POA: Diagnosis not present

## 2020-02-06 DIAGNOSIS — M5032 Other cervical disc degeneration, mid-cervical region, unspecified level: Secondary | ICD-10-CM | POA: Diagnosis not present

## 2020-02-06 DIAGNOSIS — M9901 Segmental and somatic dysfunction of cervical region: Secondary | ICD-10-CM | POA: Diagnosis not present

## 2020-02-11 DIAGNOSIS — M5414 Radiculopathy, thoracic region: Secondary | ICD-10-CM | POA: Diagnosis not present

## 2020-02-11 DIAGNOSIS — M9902 Segmental and somatic dysfunction of thoracic region: Secondary | ICD-10-CM | POA: Diagnosis not present

## 2020-02-11 DIAGNOSIS — M9901 Segmental and somatic dysfunction of cervical region: Secondary | ICD-10-CM | POA: Diagnosis not present

## 2020-02-11 DIAGNOSIS — M5032 Other cervical disc degeneration, mid-cervical region, unspecified level: Secondary | ICD-10-CM | POA: Diagnosis not present

## 2020-02-26 DIAGNOSIS — M5414 Radiculopathy, thoracic region: Secondary | ICD-10-CM | POA: Diagnosis not present

## 2020-02-26 DIAGNOSIS — M9902 Segmental and somatic dysfunction of thoracic region: Secondary | ICD-10-CM | POA: Diagnosis not present

## 2020-02-26 DIAGNOSIS — M5032 Other cervical disc degeneration, mid-cervical region, unspecified level: Secondary | ICD-10-CM | POA: Diagnosis not present

## 2020-02-26 DIAGNOSIS — M9901 Segmental and somatic dysfunction of cervical region: Secondary | ICD-10-CM | POA: Diagnosis not present

## 2020-03-12 DIAGNOSIS — Z79899 Other long term (current) drug therapy: Secondary | ICD-10-CM | POA: Diagnosis not present

## 2020-03-12 DIAGNOSIS — L400 Psoriasis vulgaris: Secondary | ICD-10-CM | POA: Diagnosis not present

## 2020-03-24 DIAGNOSIS — N401 Enlarged prostate with lower urinary tract symptoms: Secondary | ICD-10-CM | POA: Diagnosis not present

## 2020-03-24 DIAGNOSIS — N3943 Post-void dribbling: Secondary | ICD-10-CM | POA: Diagnosis not present

## 2020-03-24 DIAGNOSIS — R3915 Urgency of urination: Secondary | ICD-10-CM | POA: Diagnosis not present

## 2020-03-24 DIAGNOSIS — N5201 Erectile dysfunction due to arterial insufficiency: Secondary | ICD-10-CM | POA: Diagnosis not present

## 2020-03-26 DIAGNOSIS — Z79899 Other long term (current) drug therapy: Secondary | ICD-10-CM | POA: Diagnosis not present

## 2020-03-27 ENCOUNTER — Other Ambulatory Visit: Payer: Self-pay

## 2020-03-27 ENCOUNTER — Encounter: Payer: Self-pay | Admitting: Family Medicine

## 2020-03-27 ENCOUNTER — Ambulatory Visit (INDEPENDENT_AMBULATORY_CARE_PROVIDER_SITE_OTHER): Payer: Medicare PPO

## 2020-03-27 ENCOUNTER — Ambulatory Visit: Payer: Medicare PPO | Admitting: Family Medicine

## 2020-03-27 VITALS — BP 140/82 | HR 108 | Temp 98.7°F | Ht 68.0 in | Wt 156.0 lb

## 2020-03-27 DIAGNOSIS — R0989 Other specified symptoms and signs involving the circulatory and respiratory systems: Secondary | ICD-10-CM | POA: Diagnosis not present

## 2020-03-27 DIAGNOSIS — N1831 Chronic kidney disease, stage 3a: Secondary | ICD-10-CM | POA: Diagnosis not present

## 2020-03-27 DIAGNOSIS — J45909 Unspecified asthma, uncomplicated: Secondary | ICD-10-CM | POA: Diagnosis not present

## 2020-03-27 DIAGNOSIS — I10 Essential (primary) hypertension: Secondary | ICD-10-CM

## 2020-03-27 DIAGNOSIS — R0982 Postnasal drip: Secondary | ICD-10-CM | POA: Diagnosis not present

## 2020-03-27 MED ORDER — AMLODIPINE BESYLATE 5 MG PO TABS: 5.0000 mg | ORAL_TABLET | Freq: Every day | ORAL | 1 refills | Status: DC

## 2020-03-27 MED ORDER — LOSARTAN POTASSIUM 100 MG PO TABS: 100.0000 mg | ORAL_TABLET | Freq: Every day | ORAL | 1 refills | Status: DC

## 2020-03-27 MED ORDER — FLUTICASONE PROPIONATE 50 MCG/ACT NA SUSP: 1.0000 | Freq: Every day | NASAL | 6 refills | Status: DC

## 2020-03-27 NOTE — Patient Instructions (Addendum)
  Stop combo pill (losartan hydrochlorothiazide).  Start losartan 100mg  per day. Start amlodipine 5mg  once per day.  Continue new medicine from urology for now.  If you get lightheaded or dizzy or low blood pressure readings, stop amlodipine.  Increase fluticasone inhaler to every day for now, then albuterol if needed for wheezing, congestion or shortness of breath.  Start Flonase nasal spray 1 spray in each nostril in the evening.  That may help some of the drainage down the back of the throat which could also be causing some of the congestion.  Recheck in 3 weeks.  Return to the clinic or go to the nearest emergency room if any of your symptoms worsen or new symptoms occur.    If you have lab work done today you will be contacted with your lab results within the next 2 weeks.  If you have not heard from [place] then please contact us. The fastest way to get your results is to register for My Chart.   IF you received an x-ray today, you will receive an invoice from [facility]. Please contact [facility] at [phone] with questions or concerns regarding your invoice.   IF you received labwork today, you will receive an invoice from [place]. Please contact [facility] at [phone] with questions or concerns regarding your invoice.   Our billing staff will not be able to assist you with questions regarding bills from these companies.  You will be contacted with the lab results as soon as they are available. The fastest way to get your results is to activate your My Chart account. Instructions are located on the last page of this paperwork. If you have not heard from [place] regarding the results in 2 weeks, please contact this office.

## 2020-03-27 NOTE — Progress Notes (Signed)
Subjective:  Patient ID: Frank Howell, male    DOB: 05/07/1951  Age: 69 y.o. MRN: [id number]  CC:  Chief Complaint  Patient presents with  . Hypertension    f/u on Bp. Bp laterly has been running high. Brought a bp  machine and it has been reading high as well. Also would like to discuss congestion in lungs    HPI Frank Howell presents for   Hypertension: With CKD stage 3. Creat range 1.45-1.6 since 2016 Losartan hct 100/12.5mg  QD. No new side effects.  Home readings:  - told elevated at dentist. Improved with repeat.  [id number]. Wants to change to amlodipine. Has some frequent urination - saw urology few days ago - started on doxazosin.   BP Readings from Last 3 Encounters:  03/27/20 140/82  01/04/20 132/82  11/07/19 (!) 142/90   Lab Results  Component Value Date   CREATININE 1.45 (H) 01/04/2020    Asthma: Flovent - used as needed at 2/21, without recent albuterol use at that time.  Only using flovent 2 times per week.  More chest congestion on and off past few months.  Past few days ok. Feeling of having to clear lungs. No dyspnea. No chest pains.  Upper back sore, no chest pressure.  Denies nasal congestion, but some PND.  Denies heartburn.    History Patient Active Problem List   Diagnosis Date Noted  . Pain in the chest   . Chest pain 08/08/2015  . Psoriasis 08/08/2015  . Asthma 01/10/2012  . Hypertension 01/10/2012  . Hyperlipidemia 01/10/2012  . GERD 01/20/2010  . FECAL OCCULT BLOOD 01/20/2010   Past Medical History:  Diagnosis Date  . Abnormal liver function   . Allergy   . Asthma    per patient has not had asthma attack in five years  . Dermatitis   . GERD (gastroesophageal reflux disease)    patient stated GERD is no longer a problem for him  . Hyperlipidemia   . Hypertension   . Psoriasis    No past surgical history on file. Allergies  Allergen Reactions  . Malarone [Atovaquone-Proguanil Hcl] Rash  . Penicillins    Prior to  Admission medications   Medication Sig Start Date End Date Taking? Authorizing Provider  Adalimumab (HUMIRA) 40 MG/0.8ML PSKT Inject 40 mg into the skin every 30 (thirty) days.   Yes [provider]  albuterol (VENTOLIN HFA) 108 (90 Base) MCG/ACT inhaler Inhale 1-2 puffs into the lungs every 4 (four) hours as needed for wheezing or shortness of breath. 07/05/19  Yes [name], MD  augmented betamethasone dipropionate (DIPROLENE-AF) [id number] % ointment APPLICATIONS APPLY ON THE SKIN APPLY TWICE DAILY TO LEGS 02/22/18  Yes [provider]  cholecalciferol (VITAMIN D3) 25 MCG (1000 UT) tablet Take 1,000 Units by mouth daily.   Yes [provider]  fluticasone (FLOVENT HFA) 110 MCG/ACT inhaler Inhale 1 puff into the lungs 2 (two) times daily. 01/04/20  Yes [name], MD  losartan-hydrochlorothiazide (HYZAAR) 100-12.5 MG tablet Take 1 tablet by mouth daily. 01/04/20  Yes [name], MD  vitamin C (ASCORBIC ACID) 250 MG tablet Take 500 mg by mouth daily.   Yes [provider]   Social History   Socioeconomic History  . Marital status: Married    Spouse name: Not on file  . Number of children: 3  . Years of education: Not on file  . Highest education level: Not on file  Occupational History  . Not on  file  Tobacco Use  . Smoking status: Never Smoker  . Smokeless tobacco: Never Used  Substance and Sexual Activity  . Alcohol use: Yes    Alcohol/week: 0.0 standard drinks    Comment: occasionally  . Drug use: No  . Sexual activity: Yes  Other Topics Concern  . 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 Difficulty of Paying Living Expenses:   Food Insecurity:   . Worried About Charity fundraiser in the Last Year:   . Arboriculturist in  the Last Year:   Transportation Needs:   . Film/video editor (Medical):   Marland Kitchen Lack of Transportation (Non-Medical):   Physical Activity:   . Days of Exercise per Week:   . Minutes of Exercise per Session:   Stress:   . Feeling of Stress :   Social Connections:   . Frequency of Communication with Friends and Family:   . Frequency of Social Gatherings with Friends and Family:   . Attends Religious Services:   . Active Member of Clubs or Organizations:   . Attends Archivist Meetings:   Marland Kitchen Marital Status:   Intimate Partner Violence:   . Fear of Current or Ex-Partner:   . Emotionally Abused:   Marland Kitchen Physically Abused:   . Sexually Abused:     Review of Systems  Constitutional: Negative for fatigue and unexpected weight change.  Eyes: Negative for visual disturbance.  Respiratory: Negative for cough, chest tightness and shortness of breath.   Cardiovascular: Negative for chest pain, palpitations and leg swelling.  Gastrointestinal: Negative for abdominal pain and blood in stool.  Neurological: Negative for dizziness, light-headedness and headaches.   Per HPI.   Objective:   Vitals:   03/27/20 1400  BP: 140/82  Pulse: (!) 108  Temp: 98.7 F (37.1 C)  TempSrc: Temporal  SpO2: 98%  Weight: 156 lb (70.8 kg)  Height: 5\' 8"  (1.727 m)     Physical Exam Vitals reviewed.  Constitutional:      Appearance: He is well-developed.  HENT:     Head: Normocephalic and atraumatic.  Eyes:     Pupils: Pupils are equal, round, and reactive to light.  Neck:     Vascular: No carotid bruit or JVD.  Cardiovascular:     Rate and Rhythm: Normal rate and regular rhythm.     Heart sounds: Normal heart sounds. No murmur.  Pulmonary:     Effort: Pulmonary effort is normal. No respiratory distress.     Breath sounds: Normal breath sounds. No stridor. No wheezing, rhonchi or rales.  Skin:    General: Skin is warm and dry.  Neurological:     Mental Status: He is alert and  oriented to person, place, and time.    DG Chest 2 View  Result Date: 03/27/2020 CLINICAL DATA:  Episodic congestion.  Asthma. EXAM: CHEST - 2 VIEW COMPARISON:  July 23, 2017 FINDINGS: The lungs are clear. The heart size and pulmonary vascularity are normal. No adenopathy. No bone lesions. IMPRESSION: Lungs clear.  Cardiac silhouette within normal limits. Electronically Signed   By: Lowella Grip III M.D.   On: 03/27/2020 14:45    Assessment & Plan:  Frank Howell is a 69 y.o.  male . Essential hypertension - Plan: losartan (COZAAR) 100 MG tablet, amLODipine (NORVASC) 5 MG tablet  -Borderline control, does report some urinary frequency would like to stop HCTZ, restart amlodipine.  We will switch to losartan 100 mg by itself, add amlodipine 5 mg, caution with orthostatic hypotension or over control with recent doxazosin was added by urology.  Recheck in 3 weeks.  Asthma, unspecified asthma severity, unspecified whether complicated, unspecified whether persistent - Plan: DG Chest 2 View Chest congestion - Plan: DG Chest 2 View PND (post-nasal drip) - Plan: fluticasone (FLONASE) 50 MCG/ACT nasal spray  -Inconsistent use of steroid inhaler.  Recommend he use Flovent daily for now, trial of Flonase for possible clearance nasal drip that may be causing intermittent congestion but x-ray clear today.  Recheck 3 weeks.  Stage 3a chronic kidney disease  -Recheck creatinine at appointment in 3 weeks.  Most recent in February stable.  No orders of the defined types were placed in this encounter.  Patient Instructions       If you have lab work done today you will be contacted with your lab results within the next 2 weeks.  If you have not heard from [place] then please contact us. The fastest way to get your results is to register for My Chart.   IF you received an x-ray today, you will receive an invoice from [facility]. Please contact [facility] Radiology at [phone] with  questions or concerns regarding your invoice.   IF you received labwork today, you will receive an invoice from [place]. Please contact [facility] at [phone] with questions or concerns regarding your invoice.   Our billing staff will not be able to assist you with questions regarding bills from these companies.  You will be contacted with the lab results as soon as they are available. The fastest way to get your results is to activate your My Chart account. Instructions are located on the last page of this paperwork. If you have not heard from [place] regarding the results in 2 weeks, please contact this office.          Signed, [name], MD [facility]

## 2020-04-18 ENCOUNTER — Encounter: Payer: Self-pay | Admitting: Family Medicine

## 2020-04-18 ENCOUNTER — Other Ambulatory Visit: Payer: Self-pay

## 2020-04-18 ENCOUNTER — Ambulatory Visit: Payer: Medicare PPO | Admitting: Family Medicine

## 2020-04-18 VITALS — BP 139/89 | HR 85 | Temp 98.8°F | Ht 68.0 in | Wt 157.0 lb

## 2020-04-18 DIAGNOSIS — N401 Enlarged prostate with lower urinary tract symptoms: Secondary | ICD-10-CM

## 2020-04-18 DIAGNOSIS — R3911 Hesitancy of micturition: Secondary | ICD-10-CM

## 2020-04-18 DIAGNOSIS — I10 Essential (primary) hypertension: Secondary | ICD-10-CM | POA: Diagnosis not present

## 2020-04-18 DIAGNOSIS — J45909 Unspecified asthma, uncomplicated: Secondary | ICD-10-CM

## 2020-04-18 DIAGNOSIS — E785 Hyperlipidemia, unspecified: Secondary | ICD-10-CM

## 2020-04-18 NOTE — Patient Instructions (Addendum)
  Continue amlodipine and losartan for blood pressure.  If you notice more readings in the 140s, we may need to adjust medicines.  Otherwise follow-up in 6 months.  I will check prostate test but follow-up with urologist as planned.  Continue Flovent daily for asthma symptoms, Flonase for postnasal drip.  Follow-up if cough or congestion worsens.  Thanks for coming in today.   If you have lab work done today you will be contacted with your lab results within the next 2 weeks.  If you have not heard from [place] then please contact us. The fastest way to get your results is to register for My Chart.   IF you received an x-ray today, you will receive an invoice from [facility] Rad[facility]. Please contact [facility] Radiology at [phone] with questions or concerns regarding your invoice.   IF you received labwork today, you will receive an invoice from [place]. Please contact [facility] at [phone] with questions or concerns regarding your invoice.   Our billing staff will not be able to assist you with questions regarding bills from these companies.  You will be contacted with the lab results as soon as they are available. The fastest way to get your results is to activate your My Chart account. Instructions are located on the last page of this paperwork. If you have not heard from [place] regarding the results in 2 weeks, please contact this office.

## 2020-04-18 NOTE — Progress Notes (Signed)
Subjective:  Patient ID: Frank Howell, male    DOB: 02/09/1951  Age: 69 y.o. MRN: [id number]  CC:  Chief Complaint  Patient presents with  . Follow-up    on cough, congestion, and nasel drip. pt reports he feels better after treatment.  . Hypertension    pt reports his his BP is inconstant, but it isn't high pt reports he understandes sometimes the new medication takes time to get BP under control. pt reports other than a headache a few days ago he hasn't had any physical symptoms og hypertension. pt reports he takes his medication as directed and hasn't experiance any side effects.    HPI Frank Howell presents for   Hypertension with CKD 3. Amlodipine 5 mg daily, losartan HCT 100/12.5 daily at May 6 visit, but with urinary frequency HCTZ was stopped.  Amlodipine was restarted and switched to just losartan 100 mg.  He is on doxazosin from urology as well. No change in urination - 2 times per night. Plans on urology follow up. History of CKD stage III, plan for repeat labs today.  Range creatinine 1.45-1.60 past 3 years.    Home readings: variable - 130/84, 140/90,130/82.  BP Readings from Last 3 Encounters:  04/18/20 139/89  03/27/20 140/82  01/04/20 132/82   Lab Results  Component Value Date   CREATININE 1.45 (H) 01/04/2020    Cough, nasal congestion, postnasal drip History of asthma Discussed May 6.  Was only using Flovent 2 times per week at that time.  Noted more chest congestion off and on at that time for a few months, along with postnasal drip.  Started on Flovent daily, trial of Flonase for postnasal drip. Using flovent daily now, flonase ns daily. No postnasal drip.  Cough is better, no fever/no dyspnea. Has not needed albuterol.   BPH with LUTS: Takes doxazosin - followed by urology - requests PSA for upcoming appt.  Lab Results  Component Value Date   PSA1 3.8 10/03/2018   PSA1 2.7 12/02/2016   PSA 3.5 10/21/2016   PSA 2.40 04/29/2016   PSA 3.17 12/03/2014     Hyperlipidemia: Borderline in 2019. Not on meds.  Lab Results  Component Value Date   CHOL 171 07/05/2019   HDL 55 07/05/2019   LDLCALC 101 (H) 07/05/2019   TRIG 73 07/05/2019   CHOLHDL 3.1 07/05/2019   Lab Results  Component Value Date   ALT 11 07/05/2019   AST 18 07/05/2019   ALKPHOS 66 07/05/2019   BILITOT 1.2 07/05/2019      History Patient Active Problem List   Diagnosis Date Noted  . Pain in the chest   . Chest pain 08/08/2015  . Psoriasis 08/08/2015  . Asthma 01/10/2012  . Hypertension 01/10/2012  . Hyperlipidemia 01/10/2012  . GERD 01/20/2010  . FECAL OCCULT BLOOD 01/20/2010   Past Medical History:  Diagnosis Date  . Abnormal liver function   . Allergy   . Asthma    per patient has not had asthma attack in five years  . Dermatitis   . GERD (gastroesophageal reflux disease)    patient stated GERD is no longer a problem for him  . Hyperlipidemia   . Hypertension   . Psoriasis    No past surgical history on file. Allergies  Allergen Reactions  . Malarone [Atovaquone-Proguanil Hcl] Rash  . Penicillins    Prior to Admission medications   Medication Sig Start Date End Date Taking? Authorizing Provider  Adalimumab (HUMIRA) 40 MG/0.8ML PSKT Inject  40 mg into the skin every 30 (thirty) days.   Yes [provider]  albuterol (VENTOLIN HFA) 108 (90 Base) MCG/ACT inhaler Inhale 1-2 puffs into the lungs every 4 (four) hours as needed for wheezing or shortness of breath. 07/05/19  Yes [name], MD  amLODipine (NORVASC) 5 MG tablet Take 1 tablet (5 mg total) by mouth daily. 03/27/20  Yes [name], MD  augmented betamethasone dipropionate (DIPROLENE-AF) [id number] % ointment APPLICATIONS APPLY ON THE SKIN APPLY TWICE DAILY TO LEGS 02/22/18  Yes [provider]  cholecalciferol (VITAMIN D3) 25 MCG (1000 UT) tablet Take 1,000 Units by mouth daily.   Yes [provider]  fluticasone (FLONASE) 50 MCG/ACT nasal spray Place 1 spray  into both nostrils daily. 03/27/20  Yes [name], MD  fluticasone (FLOVENT HFA) 110 MCG/ACT inhaler Inhale 1 puff into the lungs 2 (two) times daily. 01/04/20  Yes [name], MD  losartan (COZAAR) 100 MG tablet Take 1 tablet (100 mg total) by mouth daily. 03/27/20  Yes [name], MD  losartan-hydrochlorothiazide (HYZAAR) 100-12.5 MG tablet Take 1 tablet by mouth daily. 01/04/20  Yes [name], MD  vitamin C (ASCORBIC ACID) 250 MG tablet Take 500 mg by mouth daily.   Yes [provider]   Social History   Socioeconomic History  . Marital status: Married    Spouse name: Not on file  . Number of children: 3  . Years of education: Not on file  . Highest education level: Not on file  Occupational History  . Not on file  Tobacco Use  . Smoking status: Never Smoker  . Smokeless tobacco: Never Used  Substance and Sexual Activity  . Alcohol use: Yes    Alcohol/week: 0.0 standard drinks    Comment: occasionally  . Drug use: No  . Sexual activity: Yes  Other Topics Concern  . 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 Difficulty of Paying Living Expenses:   Food Insecurity:   . Worried About Charity fundraiser in the Last Year:   . Arboriculturist in the Last Year:   Transportation Needs:   . Film/video editor (Medical):   Marland Kitchen Lack of Transportation (Non-Medical):   Physical Activity:   . Days of Exercise per Week:   . Minutes of Exercise per Session:   Stress:   . Feeling of Stress :   Social Connections:   . Frequency of Communication with Friends and Family:   . Frequency of Social Gatherings with Friends and Family:   . Attends Religious Services:   . Active Member of Clubs or Organizations:   . Attends Archivist Meetings:    Marland Kitchen Marital Status:   Intimate Partner Violence:   . Fear of Current or Ex-Partner:   . Emotionally Abused:   Marland Kitchen Physically Abused:   . Sexually Abused:     Review of Systems  Constitutional: Negative for fatigue and unexpected weight change.  Eyes: Negative for visual disturbance.  Respiratory: Negative for cough, chest tightness and shortness of breath.   Cardiovascular: Negative for chest pain, palpitations and leg swelling.  Gastrointestinal: Negative for abdominal pain and blood in stool.  Genitourinary: Negative for  difficulty urinating (nocturai stable 2-3/nt. ).  Neurological: Negative for dizziness, light-headedness and headaches.     Objective:   Vitals:   04/18/20 1127  BP: 139/89  Pulse: 85  Temp: 98.8 F (37.1 C)  TempSrc: Temporal  SpO2: 99%  Weight: 157 lb (71.2 kg)  Height: 5\' 8"  (1.727 m)     Physical Exam Vitals reviewed.  Constitutional:      Appearance: He is well-developed.  HENT:     Head: Normocephalic and atraumatic.  Eyes:     Pupils: Pupils are equal, round, and reactive to light.  Neck:     Vascular: No carotid bruit or JVD.  Cardiovascular:     Rate and Rhythm: Normal rate and regular rhythm.     Heart sounds: Normal heart sounds. No murmur.  Pulmonary:     Effort: Pulmonary effort is normal.     Breath sounds: Normal breath sounds. No rales.  Abdominal:     General: Abdomen is flat.     Tenderness: There is no abdominal tenderness.  Skin:    General: Skin is warm and dry.  Neurological:     Mental Status: He is alert and oriented to person, place, and time.        Assessment & Plan:  Aren Saupe is a 68 y.o. male . Benign prostatic hyperplasia with urinary hesitancy - Plan: PSA  -Overall stable, will check PSA for him to follow-up with urology.  Reportedly has been taking doxazosin with relief.  Minimal change with stopping HCTZ, blood pressure overall stable.  Asthma, unspecified asthma severity, unspecified whether  complicated, unspecified whether persistent  -Improved, continue Flovent daily.  Flonase as needed for postnasal drip.  Essential hypertension - Plan: Comprehensive metabolic panel  Borderline but overall stable with amlodipine, losartan.  Continue same.  RTC precautions.  Recheck 6 months.  Check labs  Hyperlipidemia, unspecified hyperlipidemia type - Plan: Comprehensive metabolic panel, Lipid panel  -Borderline hyperlipidemia, repeat labs but doubt need for statin.  No orders of the defined types were placed in this encounter.  Patient Instructions    Continue amlodipine and losartan for blood pressure.  If you notice more readings in the 140s, we may need to adjust medicines.  Otherwise follow-up in 6 months.  I will check prostate test but follow-up with urologist as planned.  Continue Flovent daily for asthma symptoms, Flonase for postnasal drip.  Follow-up if cough or congestion worsens.  Thanks for coming in today.   If you have lab work done today you will be contacted with your lab results within the next 2 weeks.  If you have not heard from [place] then please contact us. The fastest way to get your results is to register for My Chart.   IF you received an x-ray today, you will receive an invoice from [facility] Radiology. Please contact [facility] Radiology at [phone] with questions or concerns regarding your invoice.   IF you received labwork today, you will receive an invoice from [place]. Please contact [facility] at [phone] with questions or concerns regarding your invoice.   Our billing staff will not be able to assist you with questions regarding bills from these companies.  You will be contacted with the lab results as soon as they are available. The fastest way to get your results is to activate your My Chart account. Instructions are located on the last page of this paperwork. If you have not heard from [place] regarding the results in 2 weeks, please contact this  office.         Signed, [name], MD [facility]

## 2020-04-19 LAB — COMPREHENSIVE METABOLIC PANEL
ALT: 13 IU/L (ref 0–44)
AST: 15 IU/L (ref 0–40)
Albumin/Globulin Ratio: 1.4 (ref 1.2–2.2)
Albumin: 4.1 g/dL (ref 3.8–4.8)
Alkaline Phosphatase: 83 IU/L (ref 48–121)
BUN/Creatinine Ratio: 6 — ABNORMAL LOW (ref 10–24)
BUN: 9 mg/dL (ref 8–27)
Bilirubin Total: 1 mg/dL (ref 0.0–1.2)
CO2: 26 mmol/L (ref 20–29)
Calcium: 9.1 mg/dL (ref 8.6–10.2)
Chloride: 103 mmol/L (ref 96–106)
Creatinine, Ser: 1.46 mg/dL — ABNORMAL HIGH (ref 0.76–1.27)
GFR calc Af Amer: 56 mL/min/{1.73_m2} — ABNORMAL LOW (ref >59)
GFR calc non Af Amer: 48 mL/min/{1.73_m2} — ABNORMAL LOW (ref >59)
Globulin, Total: 3 g/dL (ref 1.5–4.5)
Glucose: 108 mg/dL — ABNORMAL HIGH (ref 65–99)
Potassium: 3.6 mmol/L (ref 3.5–5.2)
Sodium: 140 mmol/L (ref 134–144)
Total Protein: 7.1 g/dL (ref 6.0–8.5)

## 2020-04-19 LAB — LIPID PANEL
Chol/HDL Ratio: 2.9 ratio (ref 0.0–5.0)
Cholesterol, Total: 176 mg/dL (ref 100–199)
HDL: 60 mg/dL (ref >39)
LDL Chol Calc (NIH): 100 mg/dL — ABNORMAL HIGH (ref 0–99)
Triglycerides: 88 mg/dL (ref 0–149)
VLDL Cholesterol Cal: 16 mg/dL (ref 5–40)

## 2020-04-19 LAB — PSA: Prostate Specific Ag, Serum: 3 ng/mL (ref 0.0–4.0)

## 2020-04-25 DIAGNOSIS — N401 Enlarged prostate with lower urinary tract symptoms: Secondary | ICD-10-CM | POA: Diagnosis not present

## 2020-04-25 DIAGNOSIS — R351 Nocturia: Secondary | ICD-10-CM | POA: Diagnosis not present

## 2020-04-25 DIAGNOSIS — N5201 Erectile dysfunction due to arterial insufficiency: Secondary | ICD-10-CM | POA: Diagnosis not present

## 2020-09-03 DIAGNOSIS — Z79899 Other long term (current) drug therapy: Secondary | ICD-10-CM | POA: Diagnosis not present

## 2020-09-03 DIAGNOSIS — L400 Psoriasis vulgaris: Secondary | ICD-10-CM | POA: Diagnosis not present

## 2020-09-19 ENCOUNTER — Other Ambulatory Visit: Payer: Self-pay | Admitting: Family Medicine

## 2020-09-19 DIAGNOSIS — I10 Essential (primary) hypertension: Secondary | ICD-10-CM

## 2020-10-15 ENCOUNTER — Other Ambulatory Visit: Payer: Self-pay

## 2020-10-15 ENCOUNTER — Encounter: Payer: Self-pay | Admitting: Family Medicine

## 2020-10-15 ENCOUNTER — Ambulatory Visit: Payer: Medicare PPO | Admitting: Family Medicine

## 2020-10-15 VITALS — BP 124/86 | HR 95 | Temp 97.7°F | Ht 68.0 in | Wt 158.0 lb

## 2020-10-15 DIAGNOSIS — J4520 Mild intermittent asthma, uncomplicated: Secondary | ICD-10-CM

## 2020-10-15 DIAGNOSIS — I10 Essential (primary) hypertension: Secondary | ICD-10-CM | POA: Diagnosis not present

## 2020-10-15 DIAGNOSIS — Z23 Encounter for immunization: Secondary | ICD-10-CM

## 2020-10-15 DIAGNOSIS — E785 Hyperlipidemia, unspecified: Secondary | ICD-10-CM

## 2020-10-15 MED ORDER — FLOVENT HFA 110 MCG/ACT IN AERO: 1.0000 | INHALATION_SPRAY | Freq: Two times a day (BID) | RESPIRATORY_TRACT | 6 refills | Status: DC

## 2020-10-15 MED ORDER — AMLODIPINE BESYLATE 5 MG PO TABS: 5.0000 mg | ORAL_TABLET | Freq: Every day | ORAL | 1 refills | Status: DC

## 2020-10-15 MED ORDER — LOSARTAN POTASSIUM 100 MG PO TABS: 100.0000 mg | ORAL_TABLET | Freq: Every day | ORAL | 1 refills | Status: AC

## 2020-10-15 NOTE — Progress Notes (Signed)
Subjective:  Patient ID: Frank Howell, male    DOB: Sep 01, 1951  Age: 69 y.o. MRN: [id number]  CC:  Chief Complaint  Patient presents with  . Follow-up    on hypertension and hyperlipidemia.  Pt reports no isssues with this condition since last OV.pt reports no phyiscal symptoms of these conditions since last OV.     HPI Frank Howell presents for   Hypertension: With CKD 3.  Amlodipine 5mg  qd, losartan 100mg  qd (off hctz - urinary side effects). Off doxazosin as well - not needed. Urinating ok now off meds.  Home readings:130/70-80.  No new side effects with meds.  BP Readings from Last 3 Encounters:  10/15/20 124/86  04/18/20 139/89  03/27/20 140/82   Lab Results  Component Value Date   CREATININE 1.46 (H) 04/18/2020   Asthma: occasional chest congestion. Discussed in May. CXR clear at that time.  No cough, just feels some congestion at times, able to produce phlegm at times - about once per day. No postnasal drip, sneezing or nasal congestion.  no recent changes. No fever/weight loss/night sweats. Not using flonase as no nasal spray as needed.  Has used flovent inhaler about 2 times per week or congestion/slight wheeze - helps.  Not needing albuterol No dyspnea, no limitations in activity.  Drinks ginger with lemon. No sore throat or heartburn.  On skyrizi for psoriasis, off Humira.   Flu vaccine today. Plans on covid booster soon.  Immunization History  Administered Date(s) Administered  . Fluad Quad(high Dose 65+) 08/15/2019, 10/15/2020  . Influenza,inj,Quad PF,6+ Mos 08/09/2015, 09/06/2017, 10/03/2018  . PFIZER SARS-COV-2 Vaccination 01/01/2020, 01/26/2020  . Pneumococcal Conjugate-13 04/29/2016  . Pneumococcal Polysaccharide-23 03/27/2012  . Tdap 11/19/2009     Hyperlipidemia: No current meds. Last LDL barely elevated at 100. Eats oatmeal and flax seed.  Last ate 5 hrs ago. Plan on lipids at next visit.  The 10-year ASCVD risk score Mikey Bussing DC Brooke Bonito., et al.,  2013) is: 17%   Values used to calculate the score:     Age: 42 years     Sex: Male     Is Non-Hispanic African American: Yes     Diabetic: No     Tobacco smoker: No     Systolic Blood Pressure: 683 mmHg     Is BP treated: Yes     HDL Cholesterol: 60 mg/dL     Total Cholesterol: 176 mg/dL  Lab Results  Component Value Date   CHOL 176 04/18/2020   HDL 60 04/18/2020   LDLCALC 100 (H) 04/18/2020   TRIG 88 04/18/2020   CHOLHDL 2.9 04/18/2020   Lab Results  Component Value Date   ALT 13 04/18/2020   AST 15 04/18/2020   ALKPHOS 83 04/18/2020   BILITOT 1.0 04/18/2020       History Patient Active Problem List   Diagnosis Date Noted  . Pain in the chest   . Chest pain 08/08/2015  . Psoriasis 08/08/2015  . Asthma 01/10/2012  . Hypertension 01/10/2012  . Hyperlipidemia 01/10/2012  . GERD 01/20/2010  . FECAL OCCULT BLOOD 01/20/2010   Past Medical History:  Diagnosis Date  . Abnormal liver function   . Allergy   . Asthma    per patient has not had asthma attack in five years  . Dermatitis   . GERD (gastroesophageal reflux disease)    patient stated GERD is no longer a problem for him  . Hyperlipidemia   . Hypertension   . Psoriasis  No past surgical history on file. Allergies  Allergen Reactions  . Malarone [Atovaquone-Proguanil Hcl] Rash  . Penicillins    Prior to Admission medications   Medication Sig Start Date End Date Taking? Authorizing Provider  albuterol (VENTOLIN HFA) 108 (90 Base) MCG/ACT inhaler Inhale 1-2 puffs into the lungs every 4 (four) hours as needed for wheezing or shortness of breath. 07/05/19  Yes [name], MD  amLODipine (NORVASC) 5 MG tablet Take 1 tablet (5 mg total) by mouth daily. 03/27/20  Yes [name], MD  augmented betamethasone dipropionate (DIPROLENE-AF) 5.28 % ointment APPLICATIONS APPLY ON THE SKIN APPLY TWICE DAILY TO LEGS 02/22/18  Yes [provider]  cholecalciferol (VITAMIN D3) 25 MCG (1000 UT) tablet  Take 1,000 Units by mouth daily.   Yes [provider]  fluticasone (FLONASE) 50 MCG/ACT nasal spray Place 1 spray into both nostrils daily. 03/27/20  Yes [name], MD  fluticasone (FLOVENT HFA) 110 MCG/ACT inhaler Inhale 1 puff into the lungs 2 (two) times daily. 01/04/20  Yes [name], MD  losartan (COZAAR) 100 MG tablet TAKE 1 TABLET BY MOUTH EVERY DAY 09/19/20  Yes [name], MD  SKYRIZI 150 MG/ML SOSY  09/10/20  Yes [provider]  vitamin C (ASCORBIC ACID) 250 MG tablet Take 500 mg by mouth daily.   Yes [provider]  Adalimumab (HUMIRA) 40 MG/0.8ML PSKT Inject 40 mg into the skin every 30 (thirty) days. Patient not taking: Reported on 10/15/2020    [provider]  losartan-hydrochlorothiazide (HYZAAR) 100-12.5 MG tablet Take 1 tablet by mouth daily. Patient not taking: Reported on 10/15/2020 01/04/20   [name], MD   Social History   Socioeconomic History  . Marital status: Married    Spouse name: Not on file  . Number of children: 3  . Years of education: Not on file  . Highest education level: Not on file  Occupational History  . Not on file  Tobacco Use  . Smoking status: Never Smoker  . Smokeless tobacco: Never Used  Vaping Use  . Vaping Use: Never used  Substance and Sexual Activity  . Alcohol use: Yes    Alcohol/week: 0.0 standard drinks    Comment: occasionally  . Drug use: No  . Sexual activity: Yes  Other Topics Concern  . 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 Difficulty of Paying Living Expenses: Not on file  Food Insecurity:   . Worried About Charity fundraiser in the Last Year: Not on file  . Ran Out of Food in the Last Year: Not on file  Transportation Needs:   . Lack of  Transportation (Medical): Not on file  . Lack of Transportation (Non-Medical): Not on file  Physical Activity:   . Days of Exercise per Week: Not on file  . Minutes of Exercise per Session: Not on file  Stress:   . Feeling of Stress : Not on file  Social Connections:   . Frequency of Communication with Friends and Family: Not on file  . Frequency of Social Gatherings with Friends and Family: Not on file  . Attends Religious Services: Not on file  . Active Member of Clubs or Organizations: Not on  file  . Attends Archivist Meetings: Not on file  . Marital Status: Not on file  Intimate Partner Violence:   . Fear of Current or Ex-Partner: Not on file  . Emotionally Abused: Not on file  . Physically Abused: Not on file  . Sexually Abused: Not on file    Review of Systems  Constitutional: Negative for fatigue and unexpected weight change.  Eyes: Negative for visual disturbance.  Respiratory: Negative for cough, chest tightness and shortness of breath.   Cardiovascular: Negative for chest pain, palpitations and leg swelling.  Gastrointestinal: Negative for abdominal pain and blood in stool.  Neurological: Negative for dizziness, light-headedness and headaches.     Objective:   Vitals:   10/15/20 1401 10/15/20 1404  BP: (!) 150/90 124/86  Pulse: 95   Temp: 97.7 F (36.5 C)   TempSrc: Temporal   SpO2: 99%   Weight: 158 lb (71.7 kg)   Height: 5\' 8"  (1.727 m)      Physical Exam Vitals reviewed.  Constitutional:      Appearance: He is well-developed.  HENT:     Head: Normocephalic and atraumatic.  Eyes:     Pupils: Pupils are equal, round, and reactive to light.  Neck:     Vascular: No carotid bruit or JVD.  Cardiovascular:     Rate and Rhythm: Normal rate and regular rhythm.     Heart sounds: Normal heart sounds. No murmur heard.   Pulmonary:     Effort: Pulmonary effort is normal.     Breath sounds: Normal breath sounds. No rales.  Skin:    General:  Skin is warm and dry.  Neurological:     Mental Status: He is alert and oriented to person, place, and time.        Assessment & Plan:  Frank Howell is a 69 y.o. male . Essential hypertension - Plan: losartan (COZAAR) 100 MG tablet, amLODipine (NORVASC) 5 MG tablet, Basic metabolic panel  -  Stable, tolerating current regimen. Medications refilled. Labs pending as above.   Hyperlipidemia, unspecified hyperlipidemia type  -No current meds, plan for lipids next visit.  Need for vaccination - Plan: Flu Vaccine QUAD High Dose(Fluad)   Mild intermittent asthma, unspecified whether complicated - Plan: fluticasone (FLOVENT HFA) 110 MCG/ACT inhaler  -Recommended trial of his Flovent daily to see if that lessens chest symptoms, option of adding Flonase for possible postnasal drip with RTC precautions if persistent.  Meds ordered this encounter  Medications  . fluticasone (FLOVENT HFA) 110 MCG/ACT inhaler    Sig: Inhale 1 puff into the lungs 2 (two) times daily.    Dispense:  12 g    Refill:  6  . losartan (COZAAR) 100 MG tablet    Sig: Take 1 tablet (100 mg total) by mouth daily.    Dispense:  90 tablet    Refill:  1  . amLODipine (NORVASC) 5 MG tablet    Sig: Take 1 tablet (5 mg total) by mouth daily.    Dispense:  90 tablet    Refill:  1   Patient Instructions    Try flovent daily and if congestion not improving in next few weeks, follow up to evluate those symptoms further. No other med changes at this time. Thanks for coming in today.  Return to the clinic or go to the nearest emergency room if any of your symptoms worsen or new symptoms occur.    If you have lab work done today you will  be contacted with your lab results within the next 2 weeks.  If you have not heard from [place] then please contact us. The fastest way to get your results is to register for My Chart.   IF you received an x-ray today, you will receive an invoice from [facility]. Please contact  [facility] at [phone] with questions or concerns regarding your invoice.   IF you received labwork today, you will receive an invoice from [place]. Please contact [facility] at [phone] with questions or concerns regarding your invoice.   Our billing staff will not be able to assist you with questions regarding bills from these companies.  You will be contacted with the lab results as soon as they are available. The fastest way to get your results is to activate your My Chart account. Instructions are located on the last page of this paperwork. If you have not heard from [place] regarding the results in 2 weeks, please contact this office.         Signed, [name], MD [facility]

## 2020-10-15 NOTE — Patient Instructions (Addendum)
  Try flovent daily and if congestion not improving in next few weeks, follow up to evluate those symptoms further. No other med changes at this time. Thanks for coming in today.  Return to the clinic or go to the nearest emergency room if any of your symptoms worsen or new symptoms occur.    If you have lab work done today you will be contacted with your lab results within the next 2 weeks.  If you have not heard from [place] then please contact us. The fastest way to get your results is to register for My Chart.   IF you received an x-ray today, you will receive an invoice from [facility]. Please contact [facility] at [phone] with questions or concerns regarding your invoice.   IF you received labwork today, you will receive an invoice from [place]. Please contact [facility] at [phone] with questions or concerns regarding your invoice.   Our billing staff will not be able to assist you with questions regarding bills from these companies.  You will be contacted with the lab results as soon as they are available. The fastest way to get your results is to activate your My Chart account. Instructions are located on the last page of this paperwork. If you have not heard from [place] regarding the results in 2 weeks, please contact this office.

## 2020-10-16 LAB — BASIC METABOLIC PANEL
BUN/Creatinine Ratio: 10 (ref 10–24)
BUN: 14 mg/dL (ref 8–27)
CO2: 26 mmol/L (ref 20–29)
Calcium: 9.3 mg/dL (ref 8.6–10.2)
Chloride: 104 mmol/L (ref 96–106)
Creatinine, Ser: 1.4 mg/dL — ABNORMAL HIGH (ref 0.76–1.27)
GFR calc Af Amer: 58 mL/min/{1.73_m2} — ABNORMAL LOW (ref >59)
GFR calc non Af Amer: 50 mL/min/{1.73_m2} — ABNORMAL LOW (ref >59)
Glucose: 84 mg/dL (ref 65–99)
Potassium: 4 mmol/L (ref 3.5–5.2)
Sodium: 141 mmol/L (ref 134–144)

## 2020-10-17 ENCOUNTER — Ambulatory Visit: Payer: Medicare PPO | Admitting: Family Medicine

## 2020-11-18 ENCOUNTER — Telehealth: Payer: Self-pay | Admitting: *Deleted

## 2020-11-18 NOTE — Telephone Encounter (Signed)
Schedule AWV.  

## 2020-11-18 NOTE — Telephone Encounter (Signed)
Pt missed your call, please cb.

## 2020-11-26 ENCOUNTER — Ambulatory Visit (INDEPENDENT_AMBULATORY_CARE_PROVIDER_SITE_OTHER): Payer: Medicare PPO | Admitting: Family Medicine

## 2020-11-26 VITALS — BP 124/86 | Ht 68.0 in | Wt 158.0 lb

## 2020-11-26 DIAGNOSIS — Z0000 Encounter for general adult medical examination without abnormal findings: Secondary | ICD-10-CM

## 2020-11-26 NOTE — Patient Instructions (Signed)
Thank you for taking time to come for your Medicare Wellness Visit. I appreciate your ongoing commitment to your health goals. Please review the following plan we discussed and let me know if I can assist you in the future.  Leroy Kennedy LPN  Preventive Care 72 Years and Older, Male Preventive care refers to lifestyle choices and visits with your health care provider that can promote health and wellness. This includes:  A yearly physical exam. This is also called an annual well check.  Regular dental and eye exams.  Immunizations.  Screening for certain conditions.  Healthy lifestyle choices, such as diet and exercise. What can I expect for my preventive care visit? Physical exam Your health care provider will check:  Height and weight. These may be used to calculate body mass index (BMI), which is a measurement that tells if you are at a healthy weight.  Heart rate and blood pressure.  Your skin for abnormal spots. Counseling Your health care provider may ask you questions about:  Alcohol, tobacco, and drug use.  Emotional well-being.  Home and relationship well-being.  Sexual activity.  Eating habits.  History of falls.  Memory and ability to understand (cognition).  Work and work Statistician. What immunizations do I need?  Influenza (flu) vaccine  This is recommended every year. Tetanus, diphtheria, and pertussis (Tdap) vaccine  You may need a Td booster every 10 years. Varicella (chickenpox) vaccine  You may need this vaccine if you have not already been vaccinated. Zoster (shingles) vaccine  You may need this after age 66. Pneumococcal conjugate (PCV13) vaccine  One dose is recommended after age 84. Pneumococcal polysaccharide (PPSV23) vaccine  One dose is recommended after age 88. Measles, mumps, and rubella (MMR) vaccine  You may need at least one dose of MMR if you were born in 1957 or later. You may also need a second dose. Meningococcal  conjugate (MenACWY) vaccine  You may need this if you have certain conditions. Hepatitis A vaccine  You may need this if you have certain conditions or if you travel or work in places where you may be exposed to hepatitis A. Hepatitis B vaccine  You may need this if you have certain conditions or if you travel or work in places where you may be exposed to hepatitis B. Haemophilus influenzae type b (Hib) vaccine  You may need this if you have certain conditions. You may receive vaccines as individual doses or as more than one vaccine together in one shot (combination vaccines). Talk with your health care provider about the risks and benefits of combination vaccines. What tests do I need? Blood tests  Lipid and cholesterol levels. These may be checked every 5 years, or more frequently depending on your overall health.  Hepatitis C test.  Hepatitis B test. Screening  Lung cancer screening. You may have this screening every year starting at age 24 if you have a 30-pack-year history of smoking and currently smoke or have quit within the past 15 years.  Colorectal cancer screening. All adults should have this screening starting at age 52 and continuing until age 61. Your health care provider may recommend screening at age 57 if you are at increased risk. You will have tests every 1-10 years, depending on your results and the type of screening test.  Prostate cancer screening. Recommendations will vary depending on your family history and other risks.  Diabetes screening. This is done by checking your blood sugar (glucose) after you have not eaten for  a while (fasting). You may have this done every 1-3 years.  Abdominal aortic aneurysm (AAA) screening. You may need this if you are a current or former smoker.  Sexually transmitted disease (STD) testing. Follow these instructions at home: Eating and drinking  Eat a diet that includes fresh fruits and vegetables, whole grains, lean  protein, and low-fat dairy products. Limit your intake of foods with high amounts of sugar, saturated fats, and salt.  Take vitamin and mineral supplements as recommended by your health care provider.  Do not drink alcohol if your health care provider tells you not to drink.  If you drink alcohol: ? Limit how much you have to 0-2 drinks a day. ? Be aware of how much alcohol is in your drink. In the [place] one drink equals one 12 oz bottle of beer (355 mL), one 5 oz glass of wine (148 mL), or one 1 oz glass of hard liquor (44 mL). Lifestyle  Take daily care of your teeth and gums.  Stay active. Exercise for at least 30 minutes on 5 or more days each week.  Do not use any products that contain nicotine or tobacco, such as cigarettes, e-cigarettes, and chewing tobacco. If you need help quitting, ask your health care provider.  If you are sexually active, practice safe sex. Use a condom or other form of protection to prevent STIs (sexually transmitted infections).  Talk with your health care provider about taking a low-dose aspirin or statin. What's next?  Visit your health care provider once a year for a well check visit.  Ask your health care provider how often you should have your eyes and teeth checked.  Stay up to date on all vaccines. This information is not intended to replace advice given to you by your health care provider. Make sure you discuss any questions you have with your health care provider. Document Revised: 11/02/2018 Document Reviewed: 11/02/2018 Elsevier Patient Education  2020 Elsevier Inc.  

## 2020-11-26 NOTE — Progress Notes (Signed)
Presents today for [employer] Visit   Date of last exam: 10-15-2020  Interpreter used for this visit? No  I connected with  Frank Howell on 11/26/20 by a telephone verified that I am speaking with the correct person using two identifiers.   I discussed the limitations of evaluation and management by telemedicine. The patient expressed understanding and agreed to proceed.  Patient location: home  Provider location: in office  I provided 20 minutes of non face - to - face time during this encounter.   Patient Care Team: Frank Flood, MD as PCP - General (Family Medicine) Frank Boop, MD as Consulting Physician (Gastroenterology)   Other items to address today:  Discussed Eye/Dental Discussed Immunizations Follow up scheduled Frank Howell 5/25 @ 8:20   Other Screening: Last screening for diabetes: 04-18-2020 Last lipid screening:04-18-2020  ADVANCE DIRECTIVES: Discussed: yes On File no Materials Provided: yes  Immunization status:  Immunization History  Administered Date(s) Administered  . Fluad Quad(high Dose 65+) 08/15/2019, 10/15/2020  . Influenza,inj,Quad PF,6+ Mos 08/09/2015, 09/06/2017, 10/03/2018  . PFIZER SARS-COV-2 Vaccination 01/01/2020, 01/26/2020  . Pneumococcal Conjugate-13 04/29/2016  . Pneumococcal Polysaccharide-23 03/27/2012  . Tdap 11/19/2009     Health Maintenance Due  Topic Date Due  . COVID-19 Vaccine (3 - Booster for Pfizer series) 07/28/2020  . COLONOSCOPY (Pts 45-83yrs Insurance coverage will need to be confirmed)  11/02/2020     Functional Status Survey: Is the patient deaf or have difficulty hearing?: No Does the patient have difficulty seeing, even when wearing glasses/contacts?: No Does the patient have difficulty concentrating, remembering, or making decisions?: No Does the patient have difficulty walking or climbing stairs?: No Does the patient have difficulty dressing or bathing?: No Does the patient  have difficulty doing errands alone such as visiting a doctor's office or shopping?: No   6CIT Screen 11/26/2020 11/07/2019 10/03/2018  What Year? 0 points 0 points 0 points  What month? 0 points 0 points 0 points  What time? 0 points 0 points 0 points  Count back from 20 0 points 0 points 0 points  Months in reverse 0 points - 0 points  Repeat phrase 0 points 0 points 0 points  Total Score 0 - 0      Flowsheet Row Clinical Support from 11/26/2020 in [facility]  AUDIT-C Score 1       Home Environment:   Patient still works No trouble climbing stairs Lives in two home  No grab bars No scattered rugs Adequate lighting/ no clutter   Patient Active Problem List   Diagnosis Date Noted  . Pain in the chest   . Chest pain 08/08/2015  . Psoriasis 08/08/2015  . Asthma 01/10/2012  . Hypertension 01/10/2012  . Hyperlipidemia 01/10/2012  . GERD 01/20/2010  . FECAL OCCULT BLOOD 01/20/2010     Past Medical History:  Diagnosis Date  . Abnormal liver function   . Allergy   . Asthma    per patient has not had asthma attack in five years  . Dermatitis   . GERD (gastroesophageal reflux disease)    patient stated GERD is no longer a problem for him  . Hyperlipidemia   . Hypertension   . Psoriasis      History reviewed. No pertinent surgical history.   Family History  Problem Relation Age of Onset  . Asthma Mother   . Hypertension Sister   . Hypertension Brother      Social History  Socioeconomic History  . Marital status: Married    Spouse name: Not on file  . Number of children: 3  . Years of education: Not on file  . Highest education level: Not on file  Occupational History  . Not on file  Tobacco Use  . Smoking status: Never Smoker  . Smokeless tobacco: Never Used  Vaping Use  . Vaping Use: Never used  Substance and Sexual Activity  . Alcohol use: Yes    Alcohol/week: 0.0 standard drinks    Comment: occasionally  . Drug use: No  .  Sexual activity: Yes  Other Topics Concern  . 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 Malarone [Atovaquone-Proguanil Hcl] Rash  . Penicillins      Prior to Admission medications   Medication Sig Start Date End Date Taking? Authorizing Provider  albuterol (VENTOLIN HFA) 108 (90 Base) MCG/ACT inhaler Inhale 1-2 puffs into the lungs every 4 (four) hours as needed for wheezing or shortness of breath. 07/05/19  Yes Frank Flood, MD  amLODipine (NORVASC) 5 MG tablet Take 1 tablet (5 mg total) by mouth daily. 10/15/20  Yes Frank Flood, MD  augmented betamethasone dipropionate (DIPROLENE-AF) 0.05 % ointment APPLICATIONS APPLY ON THE SKIN APPLY TWICE DAILY TO LEGS 02/22/18  Yes [provider]  cholecalciferol (VITAMIN D3) 25 MCG (1000 UT) tablet Take 1,000 Units by mouth daily.   Yes [provider]  fluticasone (FLONASE) 50 MCG/ACT nasal spray Place 1 spray into both nostrils daily. 03/27/20  Yes Frank Flood, MD  fluticasone (FLOVENT HFA) 110 MCG/ACT inhaler Inhale 1 puff into the lungs 2 (two) times daily. 10/15/20  Yes Frank Flood, MD  losartan (COZAAR) 100 MG tablet Take 1 tablet (100 mg total) by mouth daily. 10/15/20  Yes Frank Flood, MD  SKYRIZI 150 MG/ML SOSY  09/10/20  Yes [provider]  vitamin C (ASCORBIC ACID) 250 MG tablet Take 500 mg by mouth daily.   Yes [provider]     Depression screen [facility] 2/9 11/26/2020 04/18/2020 03/27/2020 01/04/2020 11/07/2019  Decreased Interest 0 0 0 0  0  Down, Depressed, Hopeless 0 0 0 0 -  PHQ - 2 Score 0 0 0 0 0     Fall Risk  11/26/2020 04/18/2020 03/27/2020 01/04/2020 11/07/2019  Falls in the past year? 0 0 0 0 0  Number falls in past yr: 0 - 0 0 0  Injury with Fall? 0 - 1 0 0  Risk for fall due to : - - - - History of fall(s)  Follow up Falls evaluation completed;Education provided Falls evaluation completed Falls evaluation completed - Falls evaluation completed;Education provided      PHYSICAL EXAM: BP 124/86 Comment: not in clinic/ taken from a previous visit  Ht 5\' 8"  (1.727 m)   Wt 158 lb (71.7 kg)   BMI 24.02 kg/m    Wt Readings from Last 3 Encounters:  11/26/20 158 lb (71.7 kg)  10/15/20 158 lb (71.7 kg)  04/18/20 157 lb (71.2 kg)      Education/Counseling provided regarding diet and exercise, prevention of chronic diseases, smoking/tobacco cessation, if applicable, and reviewed "Covered Medicare Preventive Services."

## 2020-12-13 IMAGING — DX DG CHEST 2V
2 series · 2 of 2 positions shown · non-contrast
Comparison: July 23, 2017

CLINICAL DATA: Episodic congestion.  Asthma.

EXAM:
CHEST - 2 VIEW

[chest pa]
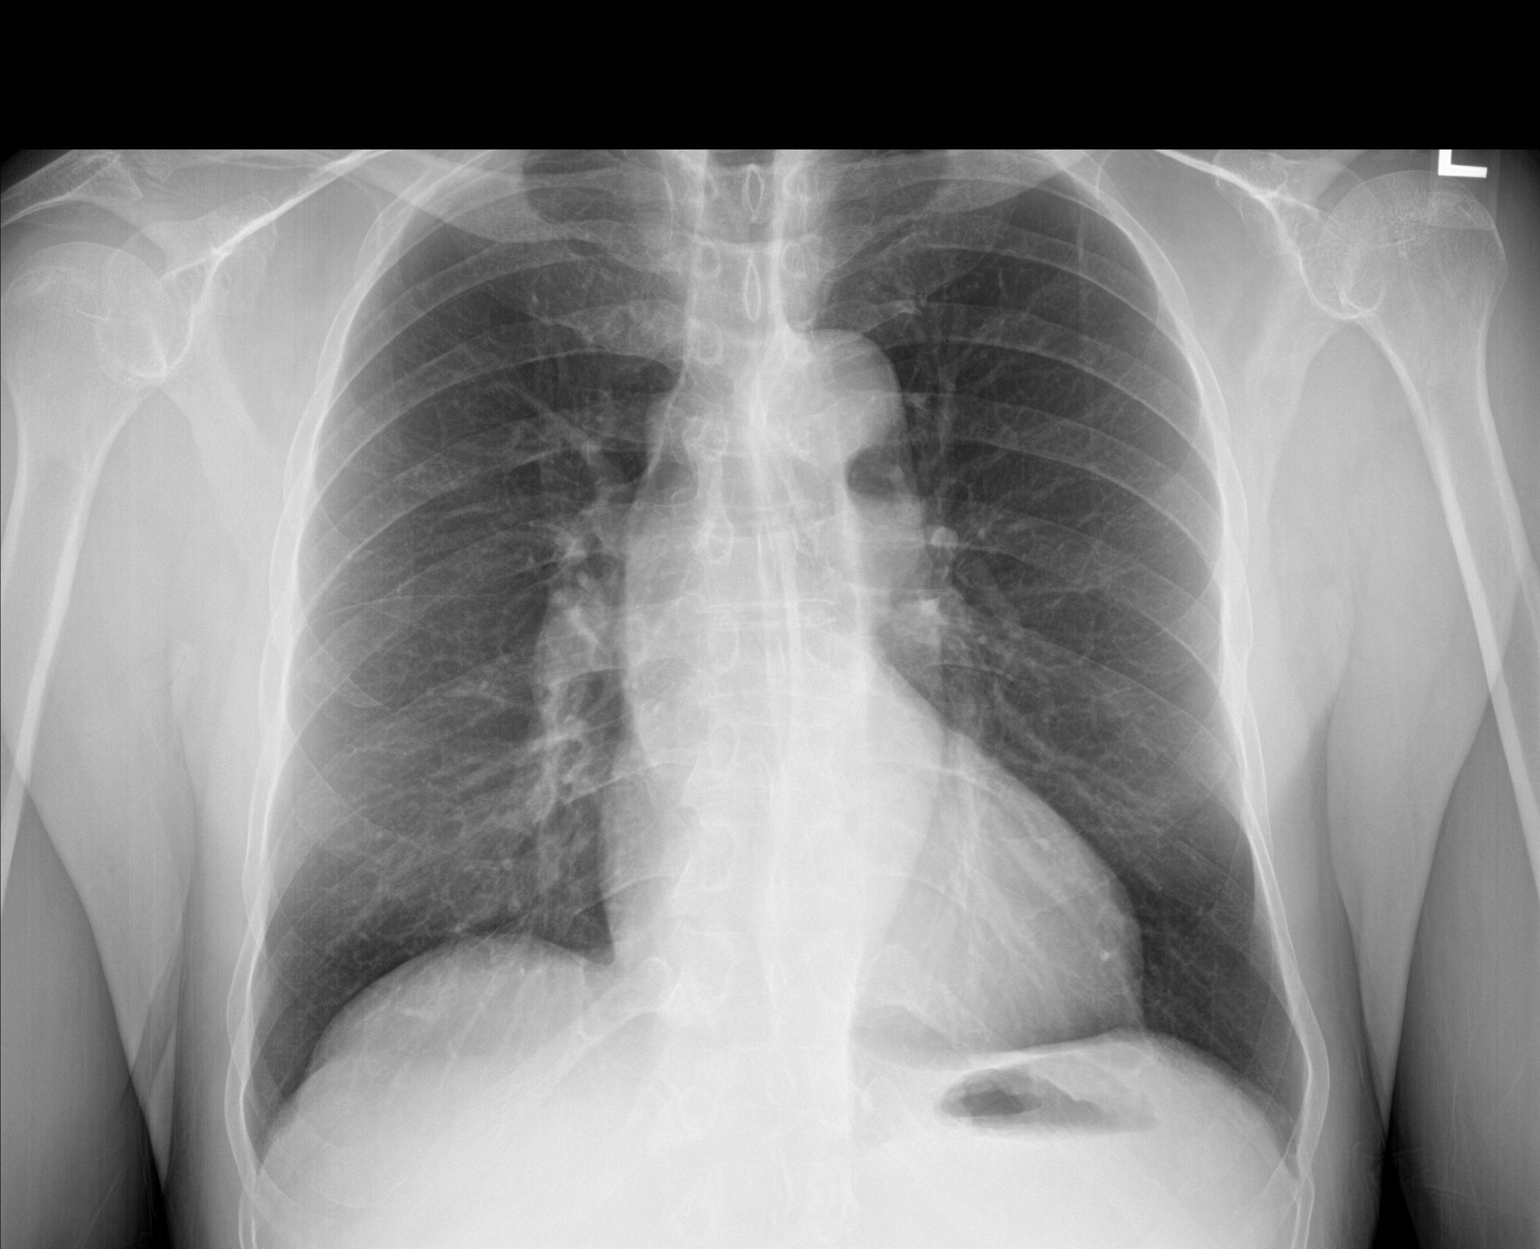

[chest lat]
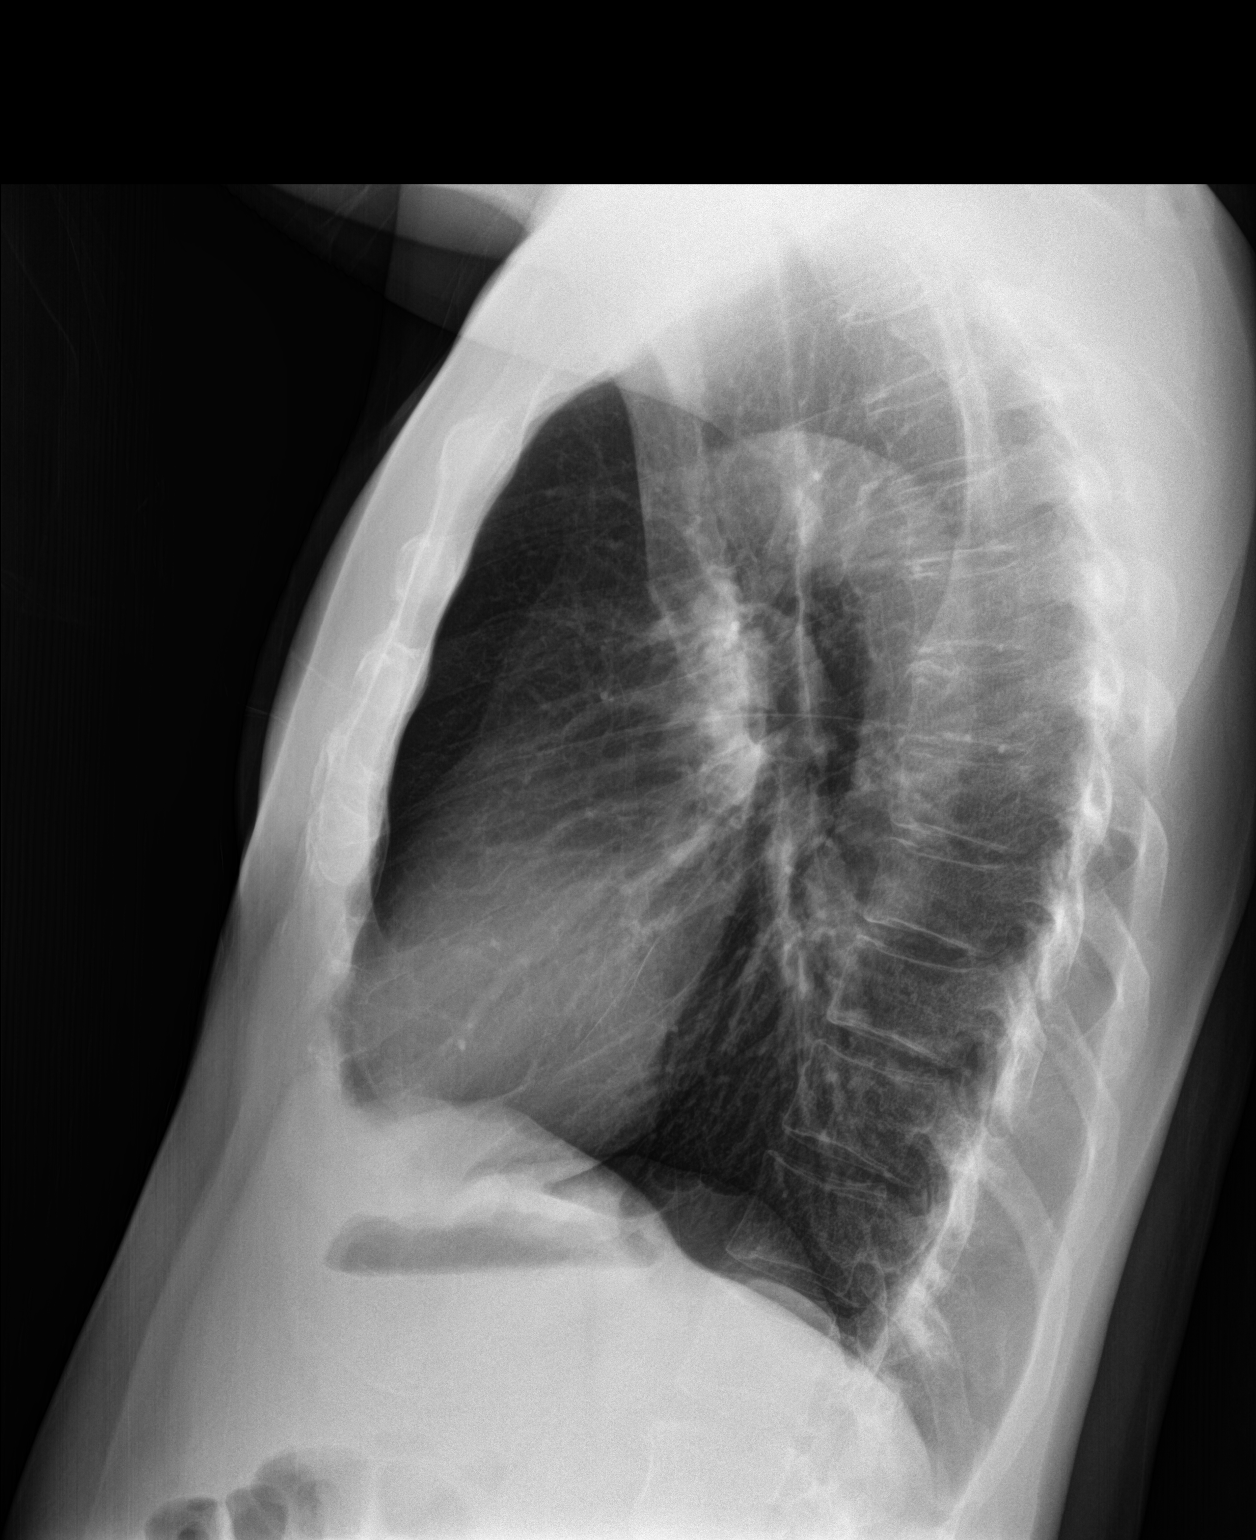

[2 of 2 positions shown; findings below may reference images not displayed]

FINDINGS: The lungs are clear. The heart size and pulmonary vascularity are
normal. No adenopathy. No bone lesions.
IMPRESSION: Lungs clear.  Cardiac silhouette within normal limits.

## 2021-01-23 ENCOUNTER — Other Ambulatory Visit: Payer: Self-pay

## 2021-01-23 ENCOUNTER — Ambulatory Visit: Payer: Medicare PPO | Admitting: Family Medicine

## 2021-01-23 ENCOUNTER — Encounter: Payer: Self-pay | Admitting: Family Medicine

## 2021-01-23 VITALS — BP 150/86 | HR 91 | Temp 98.3°F | Ht 68.0 in | Wt 158.2 lb

## 2021-01-23 DIAGNOSIS — M62838 Other muscle spasm: Secondary | ICD-10-CM | POA: Diagnosis not present

## 2021-01-23 DIAGNOSIS — M542 Cervicalgia: Secondary | ICD-10-CM | POA: Diagnosis not present

## 2021-01-23 MED ORDER — CYCLOBENZAPRINE HCL 5 MG PO TABS: 2.5000 mg | ORAL_TABLET | Freq: Every evening | ORAL | 0 refills | Status: DC | PRN

## 2021-01-23 NOTE — Progress Notes (Signed)
Subjective:  Patient ID: Frank Howell, male    DOB: July 27, 1951  Age: 70 y.o. MRN: [id number]  CC:  Chief Complaint  Patient presents with  . neck and back pain     Going on 2 weeks. 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  No injury.  Pain in neck at times for years. Better after tax season - computer work makes worse.  No recent chiropractic care. Did see in past - told had arthritis in neck. No neck surgery.  Pain comes and goes, worse past week. Tight. Sharp pain - to upper back/shoulder area. R side primarily.  No arm radiation, weakness, or arm dysesthesias.  Tx: warming lotion - min relief. Better after sleeping - starts to return during day.   History Patient Active Problem List   Diagnosis Date Noted  . Pain in the chest   . Chest pain 08/08/2015  . Psoriasis 08/08/2015  . Asthma 01/10/2012  . Hypertension 01/10/2012  . Hyperlipidemia 01/10/2012  . GERD 01/20/2010  . FECAL OCCULT BLOOD 01/20/2010   Past Medical History:  Diagnosis Date  . Abnormal liver function   . Allergy   . Asthma    per patient has not had asthma attack in five years  . Dermatitis   . GERD (gastroesophageal reflux disease)    patient stated GERD is no longer a problem for him  . Hyperlipidemia   . Hypertension   . Psoriasis    No past surgical history on file. Allergies  Allergen Reactions  . Malarone [Atovaquone-Proguanil Hcl] Rash  . Penicillins    Prior to Admission medications   Medication Sig Start Date End Date Taking? Authorizing Provider  albuterol (VENTOLIN HFA) 108 (90 Base) MCG/ACT inhaler Inhale 1-2 puffs into the lungs every 4 (four) hours as needed for wheezing or shortness of breath. 07/05/19  Yes [name], MD  amLODipine (NORVASC) 5 MG tablet Take 1 tablet (5 mg total) by mouth daily. 10/15/20  Yes [name], MD  augmented betamethasone dipropionate  (DIPROLENE-AF) 6.14 % ointment APPLICATIONS APPLY ON THE SKIN APPLY TWICE DAILY TO LEGS 02/22/18  Yes [provider]  cholecalciferol (VITAMIN D3) 25 MCG (1000 UT) tablet Take 1,000 Units by mouth daily.   Yes [provider]  fluticasone (FLONASE) 50 MCG/ACT nasal spray Place 1 spray into both nostrils daily. 03/27/20  Yes [name], MD  fluticasone (FLOVENT HFA) 110 MCG/ACT inhaler Inhale 1 puff into the lungs 2 (two) times daily. 10/15/20  Yes [name], MD  losartan (COZAAR) 100 MG tablet Take 1 tablet (100 mg total) by mouth daily. 10/15/20  Yes [name], MD  SKYRIZI 150 MG/ML SOSY  09/10/20  Yes [provider]  vitamin C (ASCORBIC ACID) 250 MG tablet Take 500 mg by mouth daily.   Yes [provider]   Social History   Socioeconomic History  . Marital status: Married    Spouse name: Not on file  . Number of children: 3  . Years of education: Not on file  . Highest education level: Not on file  Occupational History  . Not on file  Tobacco Use  . Smoking status: Never Smoker  . Smokeless tobacco: Never Used  Vaping Use  . Vaping Use: Never used  Substance and Sexual Activity  . Alcohol use: Yes    Alcohol/week:  0.0 standard drinks    Comment: occasionally  . Drug use: No  . Sexual activity: Yes  Other Topics Concern  . 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   Has 2 children   Very active and works out twice a day   Nonsmoker occasional drinker   Social Determinants of Radio broadcast assistant Strain: Not on file  Food Insecurity: Not on file  Transportation Needs: Not on file  Physical Activity: Not on file  Stress: Not on file  Social Connections: Not on file  Intimate Partner Violence: Not on file    Review of Systems   Objective:   Vitals:   01/23/21 1127 01/23/21 1130  BP: (!) 165/84 (!) 150/86  Pulse: 91   Temp: 98.3 F (36.8 C)    TempSrc: Temporal   SpO2: 99%   Weight: 158 lb 3.2 oz (71.8 kg)   Height: 5\' 8"  (1.727 m)      Physical Exam Vitals reviewed.  Constitutional:      General: He is not in acute distress.    Appearance: He is well-developed and well-nourished.  HENT:     Head: Normocephalic and atraumatic.  Cardiovascular:     Rate and Rhythm: Normal rate.  Pulmonary:     Effort: Pulmonary effort is normal.  Musculoskeletal:     Comments: Anterior neck nontender, no lymphadenopathy C-spine no midline bony tenderness, upper thoracic spine no midline bony tenderness.  Slight discomfort, reproducible at the right paraspinals of the neck into trapezius, levator scapulae.  Pain-free range of motion of the cervical spine but slight decreased extension, rotation, lateral flexion.  Upper extremity strength equal bilaterally with reflexes 2+ at biceps, triceps, brachioradialis.  Neurological:     Mental Status: He is alert and oriented to person, place, and time.  Psychiatric:        Mood and Affect: Mood and affect normal.        Assessment & Plan:  Frank Howell is a 70 y.o. male . 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  No red flags on history/exam.  Range of motion/stretches, symptomatic care discussed with heat or ice, Tylenol, and option of muscle relaxant at night with potential side effects discussed.  Reminders for frequent range of motion during the day discussed.  RTC precautions given.   Meds ordered this encounter  Medications  . cyclobenzaprine (FLEXERIL) 5 MG tablet    Sig: Take 0.5-1 tablets (2.5-5 mg total) by mouth at bedtime as needed.    Dispense:  15 tablet    Refill:  0   Patient Instructions   Neck pain is likely flair of arthritis or spasm of muscles.  Try setting alarm on his phone every  20 to 30 minutes when working for gentle range of motion or stretches.  Try to look up and not down at her computer all day.  Tylenol is fine if needed, heat or ice to the area if it feels better.  I did write a muscle relaxant that can be taken at bedtime but that can be very sedating.  If not improving in the next few weeks return for recheck, sooner if worse.     If you have lab work done today you will be contacted with your lab results  within the next 2 weeks.  If you have not heard from [place] then please contact us. The fastest way to get your results is to register for My Chart.   IF you received an x-ray today, you will receive an invoice from [facility]. Please contact [facility] at [phone] with questions or concerns regarding your invoice.   IF you received labwork today, you will receive an invoice from [facility]. Please contact [facility] at [phone] with questions or concerns regarding your invoice.   Our billing staff will not be able to assist you with questions regarding bills from these companies.  You will be contacted with the lab results as soon as they are available. The fastest way to get your results is to activate your My Chart account. Instructions are located on the last page of this paperwork. If you have not heard from [place] regarding the results in 2 weeks, please contact this office.          Signed, [name], MD [facility]

## 2021-01-23 NOTE — Patient Instructions (Addendum)
Neck pain is likely flair of arthritis or spasm of muscles.  Try setting alarm on his phone every 20 to 30 minutes when working for gentle range of motion or stretches.  Try to look up and not down at her computer all day.  Tylenol is fine if needed, heat or ice to the area if it feels better.  I did write a muscle relaxant that can be taken at bedtime but that can be very sedating.  If not improving in the next few weeks return for recheck, sooner if worse.     If you have lab work done today you will be contacted with your lab results within the next 2 weeks.  If you have not heard from [place] then please contact us. The fastest way to get your results is to register for My Chart.   IF you received an x-ray today, you will receive an invoice from [facility]. Please contact [facility] at [phone] with questions or concerns regarding your invoice.   IF you received labwork today, you will receive an invoice from [facility]. Please contact [facility] at [phone] with questions or concerns regarding your invoice.   Our billing staff will not be able to assist you with questions regarding bills from these companies.  You will be contacted with the lab results as soon as they are available. The fastest way to get your results is to activate your My Chart account. Instructions are located on the last page of this paperwork. If you have not heard from [place] regarding the results in 2 weeks, please contact this office.

## 2021-04-15 ENCOUNTER — Ambulatory Visit: Payer: Self-pay | Admitting: Family Medicine

## 2021-04-29 ENCOUNTER — Other Ambulatory Visit: Payer: Self-pay | Admitting: Family Medicine

## 2021-04-29 DIAGNOSIS — I10 Essential (primary) hypertension: Secondary | ICD-10-CM

## 2021-05-12 NOTE — Progress Notes (Signed)
Date:  05/13/2021   ID:  Frank Howell, DOB 09/27/1951, MRN [id number]  PCP:  Benito Mccreedy, MD  Cardiologist:  Rex Kras, DO, [facility] (established care 05/13/2021)  REASON FOR CONSULT: Hypertension, enlarged left atrium  REQUESTING PHYSICIAN:  Benito Mccreedy, MD [address] [place] [address]  Chief Complaint  Patient presents with   Hypertension   New Patient (Initial Visit)    HPI  Frank Howell is a 70 y.o. male who presents to the office with a chief complaint of " cardiac evaluation given his hypertension and left atrial enlargement." Patient's past medical history and cardiovascular risk factors include: hypertension, hyperlipidemia.   He is referred to the office at the request of [name], MD for evaluation of hypertension, enlarged left atrium.  Hypertension: Patient states that he has had high blood pressure for the last 20 years and is currently on medical therapy.  However over the last several months he has noticed his blood pressures not well controlled.  His home systolic blood pressures usually range between 140-150 mmHg. This is after his recent medication changes (addition of HCTZ).  Patient states that he tries to consume a low-salt diet.  And he exercises regularly.  Patient recently had an EKG which noted normal sinus rhythm and left atrial enlargement.  He also has a family history of CAD and requested a cardiovascular evaluation.  FUNCTIONAL STATUS: Walks at least 45 minutes to an hour 4 times a week.  ALLERGIES: Allergies  Allergen Reactions   Malarone [Atovaquone-Proguanil Hcl] Rash   Penicillins     MEDICATION LIST PRIOR TO VISIT: Current Meds  Medication Sig   albuterol (VENTOLIN HFA) 108 (90 Base) MCG/ACT inhaler Inhale 1-2 puffs into the lungs every 4 (four) hours as needed for wheezing or shortness of breath.   amLODipine (NORVASC) 5 MG tablet TAKE 1 TABLET BY MOUTH EVERY DAY   augmented betamethasone  dipropionate (DIPROLENE-AF) 1.94 % ointment APPLICATIONS APPLY ON THE SKIN APPLY TWICE DAILY TO LEGS   budesonide-formoterol (SYMBICORT) 160-4.5 MCG/ACT inhaler Inhale 2 puffs into the lungs 2 (two) times daily.   cholecalciferol (VITAMIN D3) 25 MCG (1000 UT) tablet Take 1,000 Units by mouth daily.   hydrochlorothiazide (HYDRODIURIL) 25 MG tablet Take 25 mg by mouth daily.   losartan (COZAAR) 100 MG tablet Take 1 tablet (100 mg total) by mouth daily.   vitamin C (ASCORBIC ACID) 250 MG tablet Take 500 mg by mouth daily.   Current Facility-Administered Medications for the 05/13/21 encounter (Office Visit) with Rex Kras, DO  Medication   nitroGLYCERIN (NITROSTAT) SL tablet 0.3 mg     PAST MEDICAL HISTORY: Past Medical History:  Diagnosis Date   Abnormal liver function    Allergy    Asthma    per patient has not had asthma attack in five years   Dermatitis    GERD (gastroesophageal reflux disease)    patient stated GERD is no longer a problem for him   Hyperlipidemia    Hypertension    Psoriasis     PAST SURGICAL HISTORY: History reviewed. No pertinent surgical history.  FAMILY HISTORY: The patient family history includes Asthma in his mother; Hypertension in his brother and sister.  SOCIAL HISTORY:  The patient  reports that he has never smoked. He has never used smokeless tobacco. He reports current alcohol use. He reports that he does not use drugs.  REVIEW OF SYSTEMS: Review of Systems  Constitutional: Negative for chills and fever.  HENT:  Negative for hoarse voice and nosebleeds.   Eyes:  Negative for discharge, double vision and pain.  Cardiovascular:  Negative for chest pain, claudication, dyspnea on exertion, leg swelling, near-syncope, orthopnea, palpitations, paroxysmal nocturnal dyspnea and syncope.  Respiratory:  Negative for hemoptysis and shortness of breath.   Musculoskeletal:  Negative for muscle cramps and myalgias.  Gastrointestinal:  Negative for  abdominal pain, constipation, diarrhea, hematemesis, hematochezia, melena, nausea and vomiting.  Neurological:  Negative for dizziness and light-headedness.   PHYSICAL EXAM: Vitals with BMI 05/13/2021 05/13/2021 01/23/2021  Height - 5' 8" -  Weight - 162 lbs -  BMI - 69.67 -  Systolic 893 810 175  Diastolic 88 76 86  Pulse 93 92 -    CONSTITUTIONAL: Well-developed and well-nourished. No acute distress.  SKIN: Skin is warm and dry. No rash noted. No cyanosis. No pallor. No jaundice HEAD: Normocephalic and atraumatic.  EYES: No scleral icterus MOUTH/THROAT: Moist oral membranes.  NECK: No JVD present. No thyromegaly noted. No carotid bruits  LYMPHATIC: No visible cervical adenopathy.  CHEST Normal respiratory effort. No intercostal retractions  LUNGS: Clear to auscultation bilaterally.  No stridor. No wheezes. No rales.  CARDIOVASCULAR: Regular rate and rhythm, positive S1-S2, no murmurs rubs or gallops appreciated ABDOMINAL: Nonobese, soft, nontender, nondistended, positive bowel sounds in all 4 quadrants, no apparent ascites.  EXTREMITIES: No peripheral edema, 2+ dorsalis pedis and posterior tibial pulses. HEMATOLOGIC: No significant bruising NEUROLOGIC: Oriented to person, place, and time. Nonfocal. Normal muscle tone.  PSYCHIATRIC: Normal mood and affect. Normal behavior. Cooperative  CARDIAC DATABASE: EKG: 05/13/2021: Normal sinus rhythm, 85 bpm, left axis deviation, left atrial enlargement, without underlying ischemia or injury pattern.   Echocardiogram: No results found for this or any previous visit from the past 1095 days.    Stress Testing: No results found for this or any previous visit from the past 1095 days.   Heart Catheterization: None  LABORATORY DATA: CBC Latest Ref Rng & Units 10/21/2016 10/04/2016 09/30/2016  WBC 3.8 - 10.8 K/uL 5.3 13.0(A) 11.0(A)  Hemoglobin 13.2 - 17.1 g/dL 15.8 15.8 15.7  Hematocrit 38.5 - 50.0 % 47.3 44.3 44.0  Platelets 140 - 400  K/uL 219 - -    CMP Latest Ref Rng & Units 10/15/2020 04/18/2020 01/04/2020  Glucose 65 - 99 mg/dL 84 108(H) 90  BUN 8 - 27 mg/dL _0 Creatinine 0.76 - 1.27 mg/dL 1.40(H) 1.46(H) 1.45(H)  Sodium 134 - 144 mmol/L 141 140 142  Potassium 3.5 - 5.2 mmol/L 4.0 3.6 4.4  Chloride 96 - 106 mmol/L 104 103 102  CO2 20 - 29 mmol/L _1 Calcium 8.6 - 10.2 mg/dL 9.3 9.1 9.4  Total Protein 6.0 - 8.5 g/dL - 7.1 -  Total Bilirubin 0.0 - 1.2 mg/dL - 1.0 -  Alkaline Phos 48 - 121 IU/L - 83 -  AST 0 - 40 IU/L - 15 -  ALT 0 - 44 IU/L - 13 -    Lipid Panel     Component Value Date/Time   CHOL 176 04/18/2020 1207   TRIG 88 04/18/2020 1207   HDL 60 04/18/2020 1207   CHOLHDL 2.9 04/18/2020 1207   CHOLHDL 3.6 04/29/2016 1203   VLDL 17 04/29/2016 1203   LDLCALC 100 (H) 04/18/2020 1207   LABVLDL 16 04/18/2020 1207    No components found for: NTPROBNP No results for input(s): PROBNP in the last 8760 hours. No results for input(s): TSH in the last 8760 hours.  BMP Recent Labs    10/15/20 1601  NA 141  K 4.0  CL 104  CO2 26  GLUCOSE 84  BUN 14  CREATININE 1.40*  CALCIUM 9.3  GFRNONAA 50*  GFRAA 58*    HEMOGLOBIN A1C Lab Results  Component Value Date   HGBA1C 5.5 10/21/2016   MPG 111 10/21/2016   External Labs:  Date Collected: 04/16/2021 , information obtained by primary care provider Potassium: 3.6 Creatinine 1.37 mg/dL. eGFR: 60 mL/min per 1.73 m Hemoglobin: 16.3 g/dL and hematocrit: 49.0 % Lipid profile: Total cholesterol 208 , triglycerides 104 , HDL 64 , LDL 123 AST: 17 , ALT: 17 , alkaline phosphatase: 93   IMPRESSION:    ICD-10-CM   1. Benign hypertension  I10 EKG 12-Lead    PCV ECHOCARDIOGRAM COMPLETE    PCV MYOCARDIAL PERFUSION WO LEXISCAN    PCV RENAL/RENAL ARTERY DUPLEX COMPLETE    2. LAE (left atrial enlargement)  I51.7 PCV ECHOCARDIOGRAM COMPLETE    PCV MYOCARDIAL PERFUSION WO LEXISCAN       RECOMMENDATIONS: Frank Howell is a 70 y.o. male whose  past medical history and cardiac risk factors include: Hypertension.  Benign essential hypertension: Currently being managed by primary care provider. Recommended taking losartan and hydrochlorothiazide in the morning and amlodipine at night because the patient states that his morning blood pressure usually elevated. Echocardiogram will be ordered to evaluate for structural heart disease and left ventricular systolic function. Renal duplex to rule out renal artery stenosis  His estimated 10-year risk of ASCVD is approximately 24.2% recommend stress test to evaluate for exercise-induced ischemia.  He is educated on the importance of improving his modifiable cardiovascular risk factors such as blood pressure and lipid management.  Further recommendations to follow as case evolves.  FINAL MEDICATION LIST END OF ENCOUNTER: No orders of the defined types were placed in this encounter.   Medications Discontinued During This Encounter  Medication Reason   cyclobenzaprine (FLEXERIL) 5 MG tablet Error   fluticasone (FLONASE) 50 MCG/ACT nasal spray Error   fluticasone (FLOVENT HFA) 110 MCG/ACT inhaler Error   SKYRIZI 150 MG/ML SOSY Error     Current Outpatient Medications:    albuterol (VENTOLIN HFA) 108 (90 Base) MCG/ACT inhaler, Inhale 1-2 puffs into the lungs every 4 (four) hours as needed for wheezing or shortness of breath., Disp: 18 g, Rfl: 1   amLODipine (NORVASC) 5 MG tablet, TAKE 1 TABLET BY MOUTH EVERY DAY, Disp: 90 tablet, Rfl: 1   augmented betamethasone dipropionate (DIPROLENE-AF) 2.70 % ointment, APPLICATIONS APPLY ON THE SKIN APPLY TWICE DAILY TO LEGS, Disp: , Rfl: 3   budesonide-formoterol (SYMBICORT) 160-4.5 MCG/ACT inhaler, Inhale 2 puffs into the lungs 2 (two) times daily., Disp: , Rfl:    cholecalciferol (VITAMIN D3) 25 MCG (1000 UT) tablet, Take 1,000 Units by mouth daily., Disp: , Rfl:    hydrochlorothiazide (HYDRODIURIL) 25 MG tablet, Take 25 mg by mouth daily., Disp: , Rfl:     losartan (COZAAR) 100 MG tablet, Take 1 tablet (100 mg total) by mouth daily., Disp: 90 tablet, Rfl: 1   vitamin C (ASCORBIC ACID) 250 MG tablet, Take 500 mg by mouth daily., Disp: , Rfl:   Current Facility-Administered Medications:    nitroGLYCERIN (NITROSTAT) SL tablet 0.3 mg, 0.3 mg, Sublingual, Q5 min PRN, Weber, Sarah L, PA-C, 0.3 mg at 08/08/15 1425  Orders Placed This Encounter  Procedures   PCV MYOCARDIAL PERFUSION WO LEXISCAN   EKG 12-Lead   PCV ECHOCARDIOGRAM COMPLETE   PCV  RENAL/RENAL ARTERY DUPLEX COMPLETE    There are no Patient Instructions on file for this visit.   --Continue cardiac medications as reconciled in final medication list. --Return in about 6 weeks (around 06/24/2021) for Follow up, BP. Or sooner if needed. --Continue follow-up with your primary care physician regarding the management of your other chronic comorbid conditions.  Patient's questions and concerns were addressed to his satisfaction. He voices understanding of the instructions provided during this encounter.   This note was created using a voice recognition software as a result there may be grammatical errors inadvertently enclosed that do not reflect the nature of this encounter. Every attempt is made to correct such errors.  Rex Kras, [place], [facility]  Pager: [phone] Office: [phone]

## 2021-05-13 ENCOUNTER — Encounter: Payer: Self-pay | Admitting: Cardiology

## 2021-05-13 ENCOUNTER — Other Ambulatory Visit: Payer: Self-pay

## 2021-05-13 ENCOUNTER — Ambulatory Visit: Payer: Medicare PPO | Admitting: Cardiology

## 2021-05-13 VITALS — BP 157/88 | HR 93 | Temp 98.2°F | Resp 16 | Ht 68.0 in | Wt 162.0 lb

## 2021-05-13 DIAGNOSIS — I10 Essential (primary) hypertension: Secondary | ICD-10-CM

## 2021-05-13 DIAGNOSIS — I517 Cardiomegaly: Secondary | ICD-10-CM

## 2021-05-21 ENCOUNTER — Other Ambulatory Visit: Payer: Medicare PPO

## 2021-05-26 ENCOUNTER — Ambulatory Visit: Payer: Medicare PPO

## 2021-05-26 ENCOUNTER — Other Ambulatory Visit: Payer: Self-pay

## 2021-05-26 DIAGNOSIS — I700 Atherosclerosis of aorta: Secondary | ICD-10-CM

## 2021-05-26 DIAGNOSIS — I10 Essential (primary) hypertension: Secondary | ICD-10-CM

## 2021-05-26 DIAGNOSIS — I517 Cardiomegaly: Secondary | ICD-10-CM

## 2021-06-08 ENCOUNTER — Other Ambulatory Visit: Payer: Self-pay

## 2021-06-08 ENCOUNTER — Ambulatory Visit: Payer: Medicare PPO

## 2021-06-08 DIAGNOSIS — I517 Cardiomegaly: Secondary | ICD-10-CM

## 2021-06-08 DIAGNOSIS — I10 Essential (primary) hypertension: Secondary | ICD-10-CM

## 2021-06-23 ENCOUNTER — Other Ambulatory Visit: Payer: Self-pay

## 2021-06-23 ENCOUNTER — Encounter: Payer: Self-pay | Admitting: Cardiology

## 2021-06-23 ENCOUNTER — Ambulatory Visit: Payer: Medicare PPO | Admitting: Cardiology

## 2021-06-23 VITALS — BP 132/74 | HR 115 | Temp 98.4°F | Resp 16 | Ht 68.0 in | Wt 160.2 lb

## 2021-06-23 DIAGNOSIS — Z712 Person consulting for explanation of examination or test findings: Secondary | ICD-10-CM

## 2021-06-23 DIAGNOSIS — I517 Cardiomegaly: Secondary | ICD-10-CM

## 2021-06-23 DIAGNOSIS — I10 Essential (primary) hypertension: Secondary | ICD-10-CM

## 2021-06-23 NOTE — Progress Notes (Addendum)
Date:  06/23/2021   ID:  Frank Howell, DOB [birth year], MRN [id number]  PCP:  Benito Mccreedy, MD  Cardiologist:  Rex Kras, DO, [facility] (established care 05/13/2021)  Date: 06/23/21 Last Office Visit: 05/13/2021   Chief Complaint  Patient presents with   Hypertension   Follow-up    HPI  Frank Howell is a 70 y.o. male who presents to the office with a chief complaint of " blood pressure follow-up and review test results." Patient's past medical history and cardiovascular risk factors include: hypertension, hyperlipidemia.   He is referred to the office at the request of Benito Mccreedy, MD for evaluation of hypertension.  Patient has been experiencing high blood pressure for at least 20 years prior to establishing care with myself.  Over the last several months he has been experiencing that his systolic blood pressure ranges between 140-150 mmHg.  At the last office visit recommended him to split his medications so that he is taking a few in the morning and few in the evening.  His medications were also uptitrated.  Since then patient states that his home blood pressures have improved and his systolic blood pressures are consistently less than 130 mmHg.  He continues to follow a low-salt diet and has started increasing his physical activity.  Since last office visit he has undergone an echocardiogram, exercise nuclear stress test, renal duplex.  Results reviewed with him in great detail and noted below for further reference.  FUNCTIONAL STATUS: Walks at least 45 minutes to an hour 4 times a week.  ALLERGIES: Allergies  Allergen Reactions   Malarone [Atovaquone-Proguanil Hcl] Rash   Penicillins     MEDICATION LIST PRIOR TO VISIT: Current Meds  Medication Sig   albuterol (VENTOLIN HFA) 108 (90 Base) MCG/ACT inhaler Inhale 1-2 puffs into the lungs every 4 (four) hours as needed for wheezing or shortness of breath.   amLODipine (NORVASC) 10 MG tablet Take 10 mg by mouth  every evening.   augmented betamethasone dipropionate (DIPROLENE-AF) 2.63 % ointment APPLICATIONS APPLY ON THE SKIN APPLY TWICE DAILY TO LEGS   budesonide-formoterol (SYMBICORT) 160-4.5 MCG/ACT inhaler Inhale 2 puffs into the lungs 2 (two) times daily.   cholecalciferol (VITAMIN D3) 25 MCG (1000 UT) tablet Take 1,000 Units by mouth daily.   hydrochlorothiazide (HYDRODIURIL) 25 MG tablet Take 25 mg by mouth daily.   losartan (COZAAR) 100 MG tablet Take 1 tablet (100 mg total) by mouth daily.   vitamin C (ASCORBIC ACID) 250 MG tablet Take 500 mg by mouth daily.   [DISCONTINUED] amLODipine (NORVASC) 5 MG tablet TAKE 1 TABLET BY MOUTH EVERY DAY   Current Facility-Administered Medications for the 06/23/21 encounter (Office Visit) with Rex Kras, DO  Medication   nitroGLYCERIN (NITROSTAT) SL tablet 0.3 mg     PAST MEDICAL HISTORY: Past Medical History:  Diagnosis Date   Abnormal liver function    Allergy    Asthma    per patient has not had asthma attack in five years   Dermatitis    GERD (gastroesophageal reflux disease)    patient stated GERD is no longer a problem for him   Hyperlipidemia    Hypertension    Psoriasis     PAST SURGICAL HISTORY: History reviewed. No pertinent surgical history.  FAMILY HISTORY: The patient family history includes Asthma in his mother; Hypertension in his brother and sister.  SOCIAL HISTORY:  The patient  reports that he has never smoked. He has never used smokeless tobacco. He reports current alcohol  use. He reports that he does not use drugs.  REVIEW OF SYSTEMS: Review of Systems  Constitutional: Negative for chills and fever.  HENT:  Negative for hoarse voice and nosebleeds.   Eyes:  Negative for discharge, double vision and pain.  Cardiovascular:  Negative for chest pain, claudication, dyspnea on exertion, leg swelling, near-syncope, orthopnea, palpitations, paroxysmal nocturnal dyspnea and syncope.  Respiratory:  Negative for hemoptysis and  shortness of breath.   Musculoskeletal:  Negative for muscle cramps and myalgias.  Gastrointestinal:  Negative for abdominal pain, constipation, diarrhea, hematemesis, hematochezia, melena, nausea and vomiting.  Neurological:  Negative for dizziness and light-headedness.   PHYSICAL EXAM: Vitals with BMI 06/23/2021 05/13/2021 05/13/2021  Height 5' 8"  - 5' 8"   Weight 160 lbs 3 oz - 162 lbs  BMI 69.67 - 89.38  Systolic 101 751 025  Diastolic 74 88 76  Pulse 852 93 92    CONSTITUTIONAL: Well-developed and well-nourished. No acute distress.  SKIN: Skin is warm and dry. No rash noted. No cyanosis. No pallor. No jaundice HEAD: Normocephalic and atraumatic.  EYES: No scleral icterus MOUTH/THROAT: Moist oral membranes.  NECK: No JVD present. No thyromegaly noted. No carotid bruits  LYMPHATIC: No visible cervical adenopathy.  CHEST Normal respiratory effort. No intercostal retractions  LUNGS: Clear to auscultation bilaterally.  No stridor. No wheezes. No rales.  CARDIOVASCULAR: Regular rate and rhythm, positive S1-S2, no murmurs rubs or gallops appreciated ABDOMINAL: Nonobese, soft, nontender, nondistended, positive bowel sounds in all 4 quadrants, no apparent ascites.  EXTREMITIES: No peripheral edema, 2+ dorsalis pedis and posterior tibial pulses. HEMATOLOGIC: No significant bruising NEUROLOGIC: Oriented to person, place, and time. Nonfocal. Normal muscle tone.  PSYCHIATRIC: Normal mood and affect. Normal behavior. Cooperative  CARDIAC DATABASE: EKG: 05/13/2021: Normal sinus rhythm, 85 bpm, left axis deviation, left atrial enlargement, without underlying ischemia or injury pattern.   Echocardiogram: 05/26/2021: Normal LV systolic function with visual EF 65-70%. Left ventricle cavity is normal in size. Moderate left ventricular hypertrophy. Normal global wall motion. Normal diastolic filling pattern, normal LAP. Left atrial cavity is normal in size. The interatrial Septum is thin and mobile  but appears to be intact by 2D and CF Doppler interrogation. No significant valvular abnormalities. No prior study for comparison.   Stress Testing: Exercise Myoview stress test 06/08/2021: 1 Day Rest/Stress Protocol. Functional status: Fair. Chest pain: No. Reason for stopping exercise: Fatigue/weakness. Hypertensive response to exercise: No. Exercise time 57mnutes 30 seconds on Bruce protocol, achieved 7.05 METS, and 91% of age-predicted maximum heart rate. Stress ECG negative for ischemia.  Normal myocardial perfusion without convincing evidence of reversible ischemia or prior infarct. LVEF 68%, wall thickness is preserved without regional wall motion abnormalities. Low risk study.  Heart Catheterization: None  Renal artery duplex 05/26/2021: No evidence of renal artery occlusive disease in either renal artery. Mild increase in the right renal artery velocity of no hemodynamic significance. Normal intrarenal vascular perfusion is noted in both kidneys. Renal length is within normal limits for both kidneys. Mild diffuse plaque observed in the abdominal aorta. Normal abdominal aortaflow velocities noted.  LABORATORY DATA: CBC Latest Ref Rng & Units 10/21/2016 10/04/2016 09/30/2016  WBC 3.8 - 10.8 K/uL 5.3 13.0(A) 11.0(A)  Hemoglobin 13.2 - 17.1 g/dL 15.8 15.8 15.7  Hematocrit 38.5 - 50.0 % 47.3 44.3 44.0  Platelets 140 - 400 K/uL 219 - -    CMP Latest Ref Rng & Units 10/15/2020 04/18/2020 01/04/2020  Glucose 65 - 99 mg/dL 84 108(H) 90  BUN 8 -  27 mg/dL 14 9 16   Creatinine 0.76 - 1.27 mg/dL 1.40(H) 1.46(H) 1.45(H)  Sodium 134 - 144 mmol/L 141 140 142  Potassium 3.5 - 5.2 mmol/L 4.0 3.6 4.4  Chloride 96 - 106 mmol/L 104 103 102  CO2 20 - 29 mmol/L 26 26 26   Calcium 8.6 - 10.2 mg/dL 9.3 9.1 9.4  Total Protein 6.0 - 8.5 g/dL - 7.1 -  Total Bilirubin 0.0 - 1.2 mg/dL - 1.0 -  Alkaline Phos 48 - 121 IU/L - 83 -  AST 0 - 40 IU/L - 15 -  ALT 0 - 44 IU/L - 13 -    Lipid Panel      Component Value Date/Time   CHOL 176 04/18/2020 1207   TRIG 88 04/18/2020 1207   HDL 60 04/18/2020 1207   CHOLHDL 2.9 04/18/2020 1207   CHOLHDL 3.6 04/29/2016 1203   VLDL 17 04/29/2016 1203   LDLCALC 100 (H) 04/18/2020 1207   LABVLDL 16 04/18/2020 1207    No components found for: NTPROBNP No results for input(s): PROBNP in the last 8760 hours. No results for input(s): TSH in the last 8760 hours.  BMP Recent Labs    10/15/20 1601  NA 141  K 4.0  CL 104  CO2 26  GLUCOSE 84  BUN 14  CREATININE 1.40*  CALCIUM 9.3  GFRNONAA 50*  GFRAA 58*    HEMOGLOBIN A1C Lab Results  Component Value Date   HGBA1C 5.5 10/21/2016   MPG 111 10/21/2016   External Labs:  Date Collected: 04/16/2021 , information obtained by primary care provider Potassium: 3.6 Creatinine 1.37 mg/dL. eGFR: 60 mL/min per 1.73 m Hemoglobin: 16.3 g/dL and hematocrit: 49.0 % Lipid profile: Total cholesterol 208 , triglycerides 104 , HDL 64 , LDL 123 AST: 17 , ALT: 17 , alkaline phosphatase: 93   IMPRESSION:    ICD-10-CM   1. Benign hypertension  I10     2. Encounter to discuss test results  Z71.2        RECOMMENDATIONS: Mishon Blubaugh is a 70 y.o. male whose past medical history and cardiac risk factors include: Hypertension.  Benign essential hypertension: Referred to the office for evaluation and management of hypertension. At the last office visit his medications were uptitrated.  He is taking losartan and hydrochlorothiazide in the morning and amlodipine 10 mg at night. He presents today for follow-up and states that his home blood pressures are very well controlled.  SBP ranges between 118-130 mmHg.  He is also decrease his salt intake. Results of the echocardiogram, stress test, and renal duplex discussed with him at today's office visit.  Given his elevated 10-year risk of ASCVD to be greater than 20% at the last visit the shared decision was to proceed with stress testing.  He underwent  exercise nuclear stress test and was noted to have overall a low risk study.  He has increased his physical activity by going to the gym more regularly and is currently asymptomatic.  Patient is educated on importance of risk factor modifications with keeping good blood pressure control, improving lipids, and participating in 30 minutes of moderate intensity exercise 5 days a week.  No additional testing or medication changes made during today's office visit.  I will see him back on an annual basis.  Further recommendations to follow as case evolves.  FINAL MEDICATION LIST END OF ENCOUNTER: No orders of the defined types were placed in this encounter.    Current Outpatient Medications:    albuterol (  VENTOLIN HFA) 108 (90 Base) MCG/ACT inhaler, Inhale 1-2 puffs into the lungs every 4 (four) hours as needed for wheezing or shortness of breath., Disp: 18 g, Rfl: 1   amLODipine (NORVASC) 10 MG tablet, Take 10 mg by mouth every evening., Disp: , Rfl:    augmented betamethasone dipropionate (DIPROLENE-AF) 3.34 % ointment, APPLICATIONS APPLY ON THE SKIN APPLY TWICE DAILY TO LEGS, Disp: , Rfl: 3   budesonide-formoterol (SYMBICORT) 160-4.5 MCG/ACT inhaler, Inhale 2 puffs into the lungs 2 (two) times daily., Disp: , Rfl:    cholecalciferol (VITAMIN D3) 25 MCG (1000 UT) tablet, Take 1,000 Units by mouth daily., Disp: , Rfl:    hydrochlorothiazide (HYDRODIURIL) 25 MG tablet, Take 25 mg by mouth daily., Disp: , Rfl:    losartan (COZAAR) 100 MG tablet, Take 1 tablet (100 mg total) by mouth daily., Disp: 90 tablet, Rfl: 1   vitamin C (ASCORBIC ACID) 250 MG tablet, Take 500 mg by mouth daily., Disp: , Rfl:   Current Facility-Administered Medications:    nitroGLYCERIN (NITROSTAT) SL tablet 0.3 mg, 0.3 mg, Sublingual, Q5 min PRN, Weber, Sarah L, PA-C, 0.3 mg at 08/08/15 1425  No orders of the defined types were placed in this encounter.   There are no Patient Instructions on file for this visit.    --Continue cardiac medications as reconciled in final medication list. --Return in about 1 year (around 06/23/2022) for Follow up, BP. Or sooner if needed. --Continue follow-up with your primary care physician regarding the management of your other chronic comorbid conditions.  Patient's questions and concerns were addressed to his satisfaction. He voices understanding of the instructions provided during this encounter.   This note was created using a voice recognition software as a result there may be grammatical errors inadvertently enclosed that do not reflect the nature of this encounter. Every attempt is made to correct such errors.  Rex Kras, [place], [facility]  Pager: [phone] Office: [phone]

## 2021-06-24 ENCOUNTER — Ambulatory Visit: Payer: Medicare PPO | Admitting: Cardiology

## 2021-09-30 DIAGNOSIS — L400 Psoriasis vulgaris: Secondary | ICD-10-CM | POA: Diagnosis not present

## 2021-09-30 DIAGNOSIS — Z79899 Other long term (current) drug therapy: Secondary | ICD-10-CM | POA: Diagnosis not present

## 2021-11-02 DIAGNOSIS — E782 Mixed hyperlipidemia: Secondary | ICD-10-CM | POA: Diagnosis not present

## 2021-11-02 DIAGNOSIS — I10 Essential (primary) hypertension: Secondary | ICD-10-CM | POA: Diagnosis not present

## 2021-11-02 DIAGNOSIS — Z789 Other specified health status: Secondary | ICD-10-CM | POA: Diagnosis not present

## 2021-11-02 DIAGNOSIS — N522 Drug-induced erectile dysfunction: Secondary | ICD-10-CM | POA: Diagnosis not present

## 2021-11-02 DIAGNOSIS — J4530 Mild persistent asthma, uncomplicated: Secondary | ICD-10-CM | POA: Diagnosis not present

## 2021-11-02 DIAGNOSIS — N182 Chronic kidney disease, stage 2 (mild): Secondary | ICD-10-CM | POA: Diagnosis not present

## 2021-11-10 DIAGNOSIS — R972 Elevated prostate specific antigen [PSA]: Secondary | ICD-10-CM | POA: Diagnosis not present

## 2021-11-11 DIAGNOSIS — R972 Elevated prostate specific antigen [PSA]: Secondary | ICD-10-CM | POA: Diagnosis not present

## 2021-11-11 DIAGNOSIS — R3915 Urgency of urination: Secondary | ICD-10-CM | POA: Diagnosis not present

## 2021-11-11 DIAGNOSIS — N5201 Erectile dysfunction due to arterial insufficiency: Secondary | ICD-10-CM | POA: Diagnosis not present

## 2021-11-25 DIAGNOSIS — R972 Elevated prostate specific antigen [PSA]: Secondary | ICD-10-CM | POA: Diagnosis not present

## 2021-12-01 DIAGNOSIS — C61 Malignant neoplasm of prostate: Secondary | ICD-10-CM | POA: Diagnosis not present

## 2021-12-01 DIAGNOSIS — D075 Carcinoma in situ of prostate: Secondary | ICD-10-CM | POA: Diagnosis not present

## 2021-12-01 DIAGNOSIS — R972 Elevated prostate specific antigen [PSA]: Secondary | ICD-10-CM | POA: Diagnosis not present

## 2021-12-03 DIAGNOSIS — C61 Malignant neoplasm of prostate: Secondary | ICD-10-CM | POA: Diagnosis not present

## 2021-12-11 DIAGNOSIS — C61 Malignant neoplasm of prostate: Secondary | ICD-10-CM | POA: Diagnosis not present

## 2021-12-11 DIAGNOSIS — N5201 Erectile dysfunction due to arterial insufficiency: Secondary | ICD-10-CM | POA: Diagnosis not present

## 2021-12-14 DIAGNOSIS — C61 Malignant neoplasm of prostate: Secondary | ICD-10-CM | POA: Diagnosis not present

## 2021-12-31 DIAGNOSIS — M25552 Pain in left hip: Secondary | ICD-10-CM | POA: Diagnosis not present

## 2021-12-31 DIAGNOSIS — M542 Cervicalgia: Secondary | ICD-10-CM | POA: Diagnosis not present

## 2022-02-11 DIAGNOSIS — N522 Drug-induced erectile dysfunction: Secondary | ICD-10-CM | POA: Diagnosis not present

## 2022-02-11 DIAGNOSIS — J4530 Mild persistent asthma, uncomplicated: Secondary | ICD-10-CM | POA: Diagnosis not present

## 2022-02-11 DIAGNOSIS — N182 Chronic kidney disease, stage 2 (mild): Secondary | ICD-10-CM | POA: Diagnosis not present

## 2022-02-11 DIAGNOSIS — I10 Essential (primary) hypertension: Secondary | ICD-10-CM | POA: Diagnosis not present

## 2022-02-11 DIAGNOSIS — E782 Mixed hyperlipidemia: Secondary | ICD-10-CM | POA: Diagnosis not present

## 2022-03-02 ENCOUNTER — Other Ambulatory Visit: Payer: Self-pay | Admitting: Urology

## 2022-03-02 DIAGNOSIS — C61 Malignant neoplasm of prostate: Secondary | ICD-10-CM

## 2022-03-15 DIAGNOSIS — C61 Malignant neoplasm of prostate: Secondary | ICD-10-CM | POA: Diagnosis not present

## 2022-03-25 ENCOUNTER — Ambulatory Visit
Admission: RE | Admit: 2022-03-25 | Discharge: 2022-03-25 | Disposition: A | Discharge status: Home / Self Care | Payer: Medicare PPO | Source: Ambulatory Visit | Attending: Urology | Admitting: Urology

## 2022-03-25 DIAGNOSIS — C61 Malignant neoplasm of prostate: Secondary | ICD-10-CM | POA: Diagnosis not present

## 2022-03-25 MED ORDER — GADOBENATE DIMEGLUMINE 529 MG/ML IV SOLN
15.0000 mL | Freq: Once | INTRAVENOUS | Status: AC | PRN
  Administered 2022-03-25: 15 mL via INTRAVENOUS

## 2022-03-31 DIAGNOSIS — L810 Postinflammatory hyperpigmentation: Secondary | ICD-10-CM | POA: Diagnosis not present

## 2022-03-31 DIAGNOSIS — Z79899 Other long term (current) drug therapy: Secondary | ICD-10-CM | POA: Diagnosis not present

## 2022-03-31 DIAGNOSIS — L400 Psoriasis vulgaris: Secondary | ICD-10-CM | POA: Diagnosis not present

## 2022-04-05 DIAGNOSIS — Z79899 Other long term (current) drug therapy: Secondary | ICD-10-CM | POA: Diagnosis not present

## 2022-04-09 DIAGNOSIS — C61 Malignant neoplasm of prostate: Secondary | ICD-10-CM | POA: Diagnosis not present

## 2022-04-12 DIAGNOSIS — I10 Essential (primary) hypertension: Secondary | ICD-10-CM | POA: Diagnosis not present

## 2022-04-30 DIAGNOSIS — C61 Malignant neoplasm of prostate: Secondary | ICD-10-CM | POA: Diagnosis not present

## 2022-04-30 DIAGNOSIS — D075 Carcinoma in situ of prostate: Secondary | ICD-10-CM | POA: Diagnosis not present

## 2022-05-05 DIAGNOSIS — R109 Unspecified abdominal pain: Secondary | ICD-10-CM | POA: Diagnosis not present

## 2022-05-18 DIAGNOSIS — R350 Frequency of micturition: Secondary | ICD-10-CM | POA: Diagnosis not present

## 2022-05-18 DIAGNOSIS — R3912 Poor urinary stream: Secondary | ICD-10-CM | POA: Diagnosis not present

## 2022-05-18 DIAGNOSIS — R399 Unspecified symptoms and signs involving the genitourinary system: Secondary | ICD-10-CM | POA: Diagnosis not present

## 2022-05-18 DIAGNOSIS — C61 Malignant neoplasm of prostate: Secondary | ICD-10-CM | POA: Diagnosis not present

## 2022-05-18 DIAGNOSIS — R3916 Straining to void: Secondary | ICD-10-CM | POA: Diagnosis not present

## 2022-05-18 DIAGNOSIS — R82998 Other abnormal findings in urine: Secondary | ICD-10-CM | POA: Diagnosis not present

## 2022-05-27 DIAGNOSIS — E782 Mixed hyperlipidemia: Secondary | ICD-10-CM | POA: Diagnosis not present

## 2022-05-27 DIAGNOSIS — Z125 Encounter for screening for malignant neoplasm of prostate: Secondary | ICD-10-CM | POA: Diagnosis not present

## 2022-05-27 DIAGNOSIS — Z131 Encounter for screening for diabetes mellitus: Secondary | ICD-10-CM | POA: Diagnosis not present

## 2022-05-27 DIAGNOSIS — I10 Essential (primary) hypertension: Secondary | ICD-10-CM | POA: Diagnosis not present

## 2022-05-27 DIAGNOSIS — R739 Hyperglycemia, unspecified: Secondary | ICD-10-CM | POA: Diagnosis not present

## 2022-05-27 DIAGNOSIS — Z0001 Encounter for general adult medical examination with abnormal findings: Secondary | ICD-10-CM | POA: Diagnosis not present

## 2022-05-27 DIAGNOSIS — J4530 Mild persistent asthma, uncomplicated: Secondary | ICD-10-CM | POA: Diagnosis not present

## 2022-05-27 DIAGNOSIS — N182 Chronic kidney disease, stage 2 (mild): Secondary | ICD-10-CM | POA: Diagnosis not present

## 2022-05-27 DIAGNOSIS — N522 Drug-induced erectile dysfunction: Secondary | ICD-10-CM | POA: Diagnosis not present

## 2022-07-01 ENCOUNTER — Encounter (HOSPITAL_COMMUNITY): Payer: Self-pay

## 2022-07-01 ENCOUNTER — Ambulatory Visit (INDEPENDENT_AMBULATORY_CARE_PROVIDER_SITE_OTHER): Payer: Medicare PPO

## 2022-07-01 ENCOUNTER — Ambulatory Visit (HOSPITAL_COMMUNITY)
Admission: EM | Admit: 2022-07-01 | Discharge: 2022-07-01 | Disposition: A | Discharge status: Home / Self Care | Payer: Medicare PPO | Attending: Internal Medicine | Admitting: Internal Medicine

## 2022-07-01 DIAGNOSIS — R0981 Nasal congestion: Secondary | ICD-10-CM | POA: Diagnosis not present

## 2022-07-01 DIAGNOSIS — B370 Candidal stomatitis: Secondary | ICD-10-CM

## 2022-07-01 DIAGNOSIS — R0989 Other specified symptoms and signs involving the circulatory and respiratory systems: Secondary | ICD-10-CM

## 2022-07-01 DIAGNOSIS — J209 Acute bronchitis, unspecified: Secondary | ICD-10-CM | POA: Diagnosis not present

## 2022-07-01 MED ORDER — METHYLPREDNISOLONE SODIUM SUCC 125 MG IJ SOLR
INTRAMUSCULAR | Status: AC
  Filled 2022-07-01: qty 2

## 2022-07-01 MED ORDER — METHYLPREDNISOLONE SODIUM SUCC 125 MG IJ SOLR
80.0000 mg | Freq: Once | INTRAMUSCULAR | Status: AC
  Administered 2022-07-01: 80 mg via INTRAMUSCULAR

## 2022-07-01 MED ORDER — GUAIFENESIN ER 600 MG PO TB12: 600.0000 mg | ORAL_TABLET | Freq: Two times a day (BID) | ORAL | 0 refills | Status: DC

## 2022-07-01 MED ORDER — PREDNISONE 20 MG PO TABS: 20.0000 mg | ORAL_TABLET | Freq: Every day | ORAL | 0 refills | Status: AC

## 2022-07-01 MED ORDER — NYSTATIN 100000 UNIT/ML MT SUSP: 500000.0000 [IU] | Freq: Four times a day (QID) | OROMUCOSAL | 0 refills | Status: DC

## 2022-07-01 NOTE — Discharge Instructions (Addendum)
Please take prednisone 20 mg once daily starting tomorrow with breakfast for 5 days. Do not take any ibuprofen while taking this medicine. You may take guaifenesin 600 mg every 12 hours as needed at home to thin your mucus so that you are able to cough it up and blow it out of your nose easier. Follow-up with your primary care provider as scheduled at the end of the month for further evaluation and management of your symptoms. Continue to use your albuterol inhaler as needed every 4-6 hours.  Wash your mouth out after using this.  Swish and spit with nystatin mouth rinse 3-4 times daily for the next 5 to 7 days until your oral infection improves.  This will help to remove the white coating on your tongue caused by an oral yeast infection.  If you develop any new or worsening symptoms or do not improve in the next 2 to 3 days, please return.  If your symptoms are severe, please go to the emergency room.  Follow-up with your primary care provider for further evaluation and management of your symptoms as well as ongoing wellness visits.  I hope you feel better!

## 2022-07-01 NOTE — ED Triage Notes (Signed)
Pt c/o nasal/head/chest congestion with some neck stiffness x5 days. Denies taking any meds for sx's.

## 2022-07-01 NOTE — ED Provider Notes (Signed)
[facility]    CSN: [id number] Arrival date & time: 07/01/22  1225      History   Chief Complaint Chief Complaint  Patient presents with   Nasal Congestion    HPI Frank Howell is a 71 y.o. male.   Patient presents to urgent care for evaluation of nasal congestion, chest congestion, and increased snoring over the last 2 weeks.  Patient's wife is concerned about him and wanted him to come to the urgent care to get checked for possible infection.  Patient denies headache, fever/chills, throat pain, watery itchy eyes, nausea, vomiting, abdominal pain, dizziness, urinary symptoms, back pain, ear pain, shortness of breath, and chest pain. Cough is dry and worse at night time. Patient has history of asthma, but denies smoking cigarettes and other drug use. He has been using his albuterol inhaler every night with some relief of symptoms. Denies use of over the counter decongestant medications prior to arrival at urgent care. No known sick contacts. No other aggravating or relieving factors identified for patient's symptoms at this time.      Past Medical History:  Diagnosis Date   Abnormal liver function    Allergy    Asthma    per patient has not had asthma attack in five years   Dermatitis    GERD (gastroesophageal reflux disease)    patient stated GERD is no longer a problem for him   Hyperlipidemia    Hypertension    Psoriasis     Patient Active Problem List   Diagnosis Date Noted   Pain in the chest    Chest pain 08/08/2015   Psoriasis 08/08/2015   Asthma 01/10/2012   Hypertension 01/10/2012   Hyperlipidemia 01/10/2012   GERD 01/20/2010   FECAL OCCULT BLOOD 01/20/2010    History reviewed. No pertinent surgical history.     Home Medications    Prior to Admission medications   Medication Sig Start Date End Date Taking? Authorizing Provider  guaiFENesin (MUCINEX) 600 MG 12 hr tablet Take 1 tablet (600 mg total) by mouth 2 (two) times daily. 07/01/22  Yes  Talbot Grumbling, FNP  nystatin (MYCOSTATIN) 100000 UNIT/ML suspension Take 5 mLs (500,000 Units total) by mouth 4 (four) times daily. 07/01/22  Yes Talbot Grumbling, FNP  predniSONE (DELTASONE) 20 MG tablet Take 1 tablet (20 mg total) by mouth daily for 5 days. 07/01/22 07/06/22 Yes Tifanny Dollens, Stasia Cavalier, FNP  albuterol (VENTOLIN HFA) 108 (90 Base) MCG/ACT inhaler Inhale 1-2 puffs into the lungs every 4 (four) hours as needed for wheezing or shortness of breath. 07/05/19   [name], MD  amLODipine (NORVASC) 10 MG tablet Take 10 mg by mouth every evening.    [provider]  augmented betamethasone dipropionate (DIPROLENE-AF) 4.48 % ointment APPLICATIONS APPLY ON THE SKIN APPLY TWICE DAILY TO LEGS 02/22/18   [provider]  budesonide-formoterol (SYMBICORT) 160-4.5 MCG/ACT inhaler Inhale 2 puffs into the lungs 2 (two) times daily.    [provider]  cholecalciferol (VITAMIN D3) 25 MCG (1000 UT) tablet Take 1,000 Units by mouth daily.    [provider]  hydrochlorothiazide (HYDRODIURIL) 25 MG tablet Take 25 mg by mouth daily.    [provider]  losartan (COZAAR) 100 MG tablet Take 1 tablet (100 mg total) by mouth daily. 10/15/20   [name], MD  vitamin C (ASCORBIC ACID) 250 MG tablet Take 500 mg by mouth daily.    [provider]    [facility]  History Family History  Problem Relation Age of Onset   Asthma Mother    Hypertension Sister    Hypertension Brother     Social History Social History   Tobacco Use   Smoking status: Never   Smokeless tobacco: Never  Vaping Use   Vaping Use: Never used  Substance Use Topics   Alcohol use: Yes    Alcohol/week: 0.0 standard drinks of alcohol    Comment: occasionally   Drug use: No     Allergies   Malarone [atovaquone-proguanil hcl] and Penicillins   Review of Systems Review of Systems Per HPI  Physical Exam Triage Vital Signs ED Triage Vitals [07/01/22 1253]   Enc Vitals Group     BP (!) 169/97     Pulse Rate 70     Resp 18     Temp 98.2 F (36.8 C)     Temp Source Oral     SpO2 95 %     Weight      Height      Head Circumference      Peak Flow      Pain Score 0     Pain Loc      Pain Edu?      Excl. in [place]?    No data found.  Updated Vital Signs BP (!) 169/97 (BP Location: Left Arm)   Pulse 70   Temp 98.2 F (36.8 C) (Oral)   Resp 18   SpO2 95%   Visual Acuity Right Eye Distance:   Left Eye Distance:   Bilateral Distance:    Right Eye Near:   Left Eye Near:    Bilateral Near:     Physical Exam Vitals and nursing note reviewed.  Constitutional:      Appearance: Normal appearance. He is not ill-appearing or toxic-appearing.     Comments: Very pleasant patient sitting on exam in position of comfort table in no acute distress.   HENT:     Head: Normocephalic and atraumatic.     Right Ear: Hearing, tympanic membrane, ear canal and external ear normal.     Left Ear: Hearing, tympanic membrane, ear canal and external ear normal.     Nose: Nose normal.     Mouth/Throat:     Lips: Pink.     Mouth: Mucous membranes are moist.     Pharynx: No posterior oropharyngeal erythema.     Comments: White coating to tongue present consistent with oral thrush that does not extend to the posterior oropharynx. Airway is intact. Eyes:     General: Lids are normal. Vision grossly intact. Gaze aligned appropriately.     Extraocular Movements: Extraocular movements intact.     Conjunctiva/sclera: Conjunctivae normal.  Cardiovascular:     Rate and Rhythm: Normal rate and regular rhythm.     Heart sounds: Normal heart sounds, S1 normal and S2 normal.     Comments: No leg swelling bilaterally.  Pulmonary:     Effort: Pulmonary effort is normal. No respiratory distress.     Breath sounds: Normal air entry.     Comments: Dispersed course crackles throughout lung fields heard to auscultation with rhonchi to the bilateral bases. Effort normal.  No acute respiratory distress.  Abdominal:     General: Bowel sounds are normal.     Palpations: Abdomen is soft.     Tenderness: There is no guarding.  Musculoskeletal:     Cervical back: Neck supple.  Lymphadenopathy:     Cervical: No cervical  adenopathy.  Skin:    General: Skin is warm and dry.     Capillary Refill: Capillary refill takes less than 2 seconds.     Findings: No rash.  Neurological:     General: No focal deficit present.     Mental Status: He is alert and oriented to person, place, and time. Mental status is at baseline.     Cranial Nerves: No dysarthria or facial asymmetry.     Motor: No weakness.     Gait: Gait is intact. Gait normal.  Psychiatric:        Mood and Affect: Mood normal.        Speech: Speech normal.        Behavior: Behavior normal.        Thought Content: Thought content normal.        Judgment: Judgment normal.      UC Treatments / Results  Labs (all labs ordered are listed, but only abnormal results are displayed) Labs Reviewed - No data to display  EKG   Radiology No results found.  Procedures Procedures (including critical care time)  Medications Ordered in UC Medications  methylPREDNISolone sodium succinate (SOLU-MEDROL) 125 mg/2 mL injection 80 mg (80 mg Intramuscular Given 07/01/22 1446)    Initial Impression / Assessment and Plan / UC Course  I have reviewed the triage vital signs and the nursing notes.  Pertinent labs & imaging results that were available during my care of the patient were reviewed by me and considered in my medical decision making (see chart for details).   1. Acute bronchitis Symptoms and physical exam consistent with acute bronchitis etiology.  Chest x-ray negative for acute cardiopulmonary abnormality.  Patient given 80 mg Solu-Medrol IM injection in clinic and is to start taking prednisone 20 mg once daily tomorrow with food for the next 5 days.  Guaifenesin expectorant prescribed for symptom relief  of nasal congestion.  Advised patient to increase water intake to at least 8 cups of water per day to maintain hydration.  Advised patient not to take ibuprofen while taking prednisone due to increased risk of GI bleeding.  PCP follow-up as scheduled at the end of the month advised.  He may also continue to use his albuterol inhaler every 4-6 hours as needed.  Advised patient to rinse his mouth out after using the albuterol inhaler to prevent oral candidiasis infections.   2.  Oral thrush Suspect oral thrush is related to increased albuterol use over the last few weeks with lack of rinsing mouth out after each use.  Patient education provided regarding this as stated above.  Nystatin mouthwash to be used every 4 hours to treat oral thrush over the next 5 to 7 days until symptoms improve.   Discussed physical exam and available lab work findings in clinic with patient.  Counseled patient regarding appropriate use of medications and potential side effects for all medications recommended or prescribed today. Discussed red flag signs and symptoms of worsening condition,when to call the PCP office, return to urgent care, and when to seek higher level of care in the emergency department. Patient verbalizes understanding and agreement with plan. All questions answered. Patient discharged in stable condition.  Final Clinical Impressions(s) / UC Diagnoses   Final diagnoses:  Acute bronchitis, unspecified organism  Oral thrush  Nasal congestion     Discharge Instructions      Please take prednisone 20 mg once daily starting tomorrow with breakfast for 5 days. Do not take  any ibuprofen while taking this medicine. You may take guaifenesin 600 mg every 12 hours as needed at home to thin your mucus so that you are able to cough it up and blow it out of your nose easier. Follow-up with your primary care provider as scheduled at the end of the month for further evaluation and management of your  symptoms. Continue to use your albuterol inhaler as needed every 4-6 hours.  Wash your mouth out after using this.  Swish and spit with nystatin mouth rinse 3-4 times daily for the next 5 to 7 days until your oral infection improves.  This will help to remove the white coating on your tongue caused by an oral yeast infection.  If you develop any new or worsening symptoms or do not improve in the next 2 to 3 days, please return.  If your symptoms are severe, please go to the emergency room.  Follow-up with your primary care provider for further evaluation and management of your symptoms as well as ongoing wellness visits.  I hope you feel better!     ED Prescriptions     Medication Sig Dispense Auth. Provider   nystatin (MYCOSTATIN) 100000 UNIT/ML suspension Take 5 mLs (500,000 Units total) by mouth 4 (four) times daily. 60 mL Joella Prince M, FNP   predniSONE (DELTASONE) 20 MG tablet Take 1 tablet (20 mg total) by mouth daily for 5 days. 5 tablet Talbot Grumbling, FNP   guaiFENesin (MUCINEX) 600 MG 12 hr tablet Take 1 tablet (600 mg total) by mouth 2 (two) times daily. 30 tablet Talbot Grumbling, FNP      PDMP not reviewed this encounter.   Talbot Grumbling, Earl 07/04/22 1946

## 2022-07-15 DIAGNOSIS — Z0001 Encounter for general adult medical examination with abnormal findings: Secondary | ICD-10-CM | POA: Diagnosis not present

## 2022-07-15 DIAGNOSIS — N182 Chronic kidney disease, stage 2 (mild): Secondary | ICD-10-CM | POA: Diagnosis not present

## 2022-07-15 DIAGNOSIS — I10 Essential (primary) hypertension: Secondary | ICD-10-CM | POA: Diagnosis not present

## 2022-07-15 DIAGNOSIS — J4530 Mild persistent asthma, uncomplicated: Secondary | ICD-10-CM | POA: Diagnosis not present

## 2022-07-15 DIAGNOSIS — N522 Drug-induced erectile dysfunction: Secondary | ICD-10-CM | POA: Diagnosis not present

## 2022-07-15 DIAGNOSIS — E782 Mixed hyperlipidemia: Secondary | ICD-10-CM | POA: Diagnosis not present

## 2022-07-20 DIAGNOSIS — R3915 Urgency of urination: Secondary | ICD-10-CM | POA: Diagnosis not present

## 2022-07-20 DIAGNOSIS — N401 Enlarged prostate with lower urinary tract symptoms: Secondary | ICD-10-CM | POA: Diagnosis not present

## 2022-07-20 DIAGNOSIS — C61 Malignant neoplasm of prostate: Secondary | ICD-10-CM | POA: Diagnosis not present

## 2022-09-13 ENCOUNTER — Ambulatory Visit: Payer: Self-pay | Admitting: Licensed Clinical Social Worker

## 2022-09-13 NOTE — Patient Instructions (Addendum)
Visit Information  Thank you for taking time to visit with me today. Please don't hesitate to contact me if I can be of assistance to you before our next scheduled telephone appointment.  Following are the goals we discussed today:   Our next appointment is by telephone on 09/28/22 at 3:00 PM   Please call the care guide team at [phone] if you need to cancel or reschedule your appointment.   If you are experiencing a Mental Health or [place] or need someone to talk to, please go to [facility] [place] [phone])   Following is a copy of your full plan of care:   Intervention Left phone message for client asking client to call LCSW at [phone]  Mr. Melchor was given information about Care Management services by the embedded care coordination team including:  Care Management services include personalized support from designated clinical staff supervised by his physician, including individualized plan of care and coordination with other care providers 24/7 contact phone numbers for assistance for urgent and routine care needs. The patient may stop CCM services at any time (effective at the end of the month) by phone call to the office staff.  Patient agreed to services and verbal consent obtained.   Norva Riffle.Allante Beane MSW, [place] Holiday representative [facility] Care Management [phone]

## 2022-09-13 NOTE — Patient Outreach (Signed)
  Care Coordination   09/13/2022 Name: Frank Howell MRN: [id number] DOB: 1950-12-04   Care Coordination Outreach Attempts:  An unsuccessful telephone outreach was attempted today to offer the patient information about available care coordination services as a benefit of their health plan.   Follow Up Plan:  Additional outreach attempts will be made to offer the patient care coordination information and services.   Encounter Outcome:  Pt. Visit Completed  Care Coordination Interventions Activated:  Yes   Care Coordination Interventions:  Yes, provided    Norva Riffle.Aaiden Depoy MSW, [place] Holiday representative [facility] Care Management [phone]

## 2022-09-28 ENCOUNTER — Ambulatory Visit: Payer: Self-pay | Admitting: Licensed Clinical Social Worker

## 2022-09-28 NOTE — Patient Outreach (Signed)
  Care Coordination   09/28/2022 Name: Frank Howell MRN: [id number] DOB: 11-12-1951   Care Coordination Outreach Attempts:  An unsuccessful telephone outreach was attempted today to offer the patient information about available care coordination services as a benefit of their health plan.   Follow Up Plan:  Additional outreach attempts will be made to offer the patient care coordination information and services.   Encounter Outcome:  No Answer  Care Coordination Interventions Activated:  Yes   Care Coordination Interventions:  Yes, provided    Norva Riffle.Amron Guerrette MSW, [place] Holiday representative [facility] Care Management [phone]

## 2022-09-28 NOTE — Patient Instructions (Signed)
Visit Information  Thank you for taking time to visit with me today. Please don't hesitate to contact me if I can be of assistance to you before our next scheduled telephone appointment.  Following are the goals we discussed today:   Our next appointment is by telephone on 10/26/22 at 2:00 PM   Please call the care guide team at [phone] if you need to cancel or reschedule your appointment.   If you are experiencing a Mental Health or [place] or need someone to talk to, please go to [facility] [place] [phone])   Following is a copy of your full plan of care:   Intervention Left phone message for client asking client to call LCSW at [phone]  Frank Howell was given information about Care Management services by the embedded care coordination team including:  Care Management services include personalized support from designated clinical staff supervised by his physician, including individualized plan of care and coordination with other care providers 24/7 contact phone numbers for assistance for urgent and routine care needs. The patient may stop CCM services at any time (effective at the end of the month) by phone call to the office staff.  Patient agreed to services and verbal consent obtained.   Frank Howell MSW, [place] Holiday representative [facility] Care Management [phone]

## 2022-10-07 DIAGNOSIS — I10 Essential (primary) hypertension: Secondary | ICD-10-CM | POA: Diagnosis not present

## 2022-10-07 DIAGNOSIS — N522 Drug-induced erectile dysfunction: Secondary | ICD-10-CM | POA: Diagnosis not present

## 2022-10-07 DIAGNOSIS — Z23 Encounter for immunization: Secondary | ICD-10-CM | POA: Diagnosis not present

## 2022-10-07 DIAGNOSIS — N182 Chronic kidney disease, stage 2 (mild): Secondary | ICD-10-CM | POA: Diagnosis not present

## 2022-10-07 DIAGNOSIS — J4530 Mild persistent asthma, uncomplicated: Secondary | ICD-10-CM | POA: Diagnosis not present

## 2022-10-07 DIAGNOSIS — E782 Mixed hyperlipidemia: Secondary | ICD-10-CM | POA: Diagnosis not present

## 2022-10-07 DIAGNOSIS — Z0001 Encounter for general adult medical examination with abnormal findings: Secondary | ICD-10-CM | POA: Diagnosis not present

## 2022-10-26 ENCOUNTER — Ambulatory Visit: Payer: Self-pay | Admitting: Licensed Clinical Social Worker

## 2022-10-26 NOTE — Patient Outreach (Signed)
  Care Coordination   10/26/2022 Name: Frank Howell MRN: [id number] DOB: [birth year]   Care Coordination Outreach Attempts:  An unsuccessful telephone outreach was attempted today to offer the patient information about available care coordination services as a benefit of their health plan.   Follow Up Plan:  Additional outreach attempts will be made to offer the patient care coordination information and services.   Encounter Outcome:  No Answer   Care Coordination Interventions:  No, not indicated    Norva Riffle.Emelee Rodocker MSW, [place] Holiday representative [facility] Care Management [phone]

## 2022-10-28 DIAGNOSIS — N182 Chronic kidney disease, stage 2 (mild): Secondary | ICD-10-CM | POA: Diagnosis not present

## 2022-10-28 DIAGNOSIS — I10 Essential (primary) hypertension: Secondary | ICD-10-CM | POA: Diagnosis not present

## 2022-10-28 DIAGNOSIS — E782 Mixed hyperlipidemia: Secondary | ICD-10-CM | POA: Diagnosis not present

## 2022-11-02 DIAGNOSIS — L400 Psoriasis vulgaris: Secondary | ICD-10-CM | POA: Diagnosis not present

## 2022-11-02 DIAGNOSIS — Z79899 Other long term (current) drug therapy: Secondary | ICD-10-CM | POA: Diagnosis not present

## 2022-11-30 ENCOUNTER — Ambulatory Visit: Payer: Self-pay | Admitting: Licensed Clinical Social Worker

## 2022-11-30 NOTE — Patient Outreach (Signed)
  Care Coordination   Follow Up Visit Note   11/30/2022 Name: Frank Howell MRN: [id number] DOB: Dec 31, 1950  Frank Howell is a 72 y.o. year old male who sees Frank Howell, Frank Beard, MD for primary care. I spoke with  Frank Howell by phone today.  What matters to the patients health and wellness today? Patient may be interested in program support. LCSW encouraged patient to talk with his PCP about program support    Goals Addressed             This Visit's Progress    Patient may be interested in program support. LCSW encouraged Frank Howell to talk with his PCP about program support       Interventions:  LCSW spoke via phone today with client about program support. LCSW spoke via phone today with client about client needs. Client spoke of support with PCP, Frank Howell Discussed program support for client with LCSW, RN or Pharmacist Reviewed transport needs. Client said he did not have any transport needs. Reviewed food needs. Client said he did not have any food needs. Reviewed pain issues of client Client said he sees urologist occasionally for medical needs. Client said he is scheduled to see PCP for appointment in April of 2024. Encouraged client to talk with PCP about program support. Client seemed to have interest in program support      SDOH assessments and interventions completed:  Yes  SDOH Interventions Today    Flowsheet Row Most Recent Value  SDOH Interventions   Physical Activity Interventions Other (Comments)  [client has asthma and periodic chest pain.  He may have shortness of breath occasionally when ambulating.]  Stress Interventions Other (Comment)  [client said he sees urologist as needed . client may have stress in managing medical needs]        Care Coordination Interventions:  Yes, provided   Follow up plan: Follow up call for client and LCSW scheduled for 01/04/23 at 3:00 PM    Encounter Outcome:  Pt. Visit Completed   Norva Riffle.Shawnetta Lein MSW,  [address] Holiday representative [facility] Care Management [phone]

## 2022-11-30 NOTE — Patient Instructions (Signed)
Visit Information  Thank you for taking time to visit with me today. Please don't hesitate to contact me if I can be of assistance to you before our next scheduled telephone appointment.  Following are the goals we discussed today:   Our next appointment is by telephone on 01/04/23 at 3:00 PM   Please call the care guide team at [phone] if you need to cancel or reschedule your appointment.   If you are experiencing a Mental Health or [place] or need someone to talk to, please go to [facility] [place] [phone])   Following is a copy of your full plan of care:   Interventions:  LCSW spoke via phone today with client about program support. LCSW spoke via phone today with client about client needs. Client spoke of support with PCP, Dr. Benito Mccreedy Discussed program support for client with LCSW, RN or Pharmacist Reviewed transport needs. Client said he did not have any transport needs. Reviewed food needs. Client said he did not have any food needs. Reviewed pain issues of client Client said he sees urologist occasionally for medical needs. Client said he is scheduled to see PCP for appointment in April of 2024. Encouraged client to talk with PCP about program support. Client seemed to have interest in program support  Mr. Candelas was given information about Care Management services by the embedded care coordination team including:  Care Management services include personalized support from designated clinical staff supervised by his physician, including individualized plan of care and coordination with other care providers 24/7 contact phone numbers for assistance for urgent and routine care needs. The patient may stop CCM services at any time (effective at the end of the month) by phone call to the office staff.  Patient agreed to services and verbal consent obtained.   Norva Riffle.Reianna Batdorf MSW,  [place] Holiday representative [facility] Care Management [phone]

## 2022-12-11 IMAGING — MR MR PROSTATE WO/W CM
12 series · 48 of 48 positions shown · IV contrast (15 ml multihance)
Comparison: None Available.

CLINICAL DATA: Elevated PSA level of 5.01 on 11/11/2021. Biopsy
12/01/2021 revealed Gleason 3+3=6 prostate adenocarcinoma in the
right mid gland and right mid lateral gland.

EXAM:
MR PROSTATE WITHOUT AND WITH CONTRAST
TECHNIQUE: Multiplanar multisequence MRI images were obtained of the pelvis
centered about the prostate. Pre and post contrast images were
obtained.
CONTRAST:  15mL MULTIHANCE GADOBENATE DIMEGLUMINE 529 MG/ML IV SOLN

[Series 3: T2 · coronal · 3.0mm · 0.56mm/px · 1 of 23 slices shown (1 of 3)]
[im 1/23]
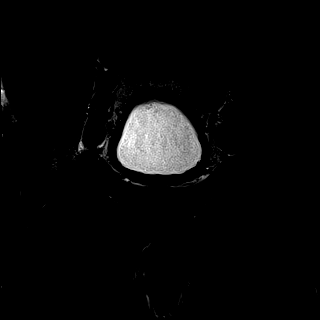

[Series 4: T1 · axial · 5.0mm · 1.25mm/px · 1 of 80 slices shown]
[im 1/80]
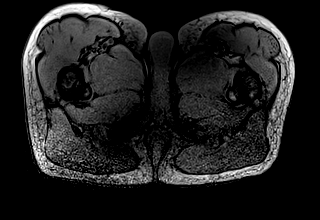

[Series 5: DWI · axial · 3.0mm · 1.75mm/px · z∈[-4,+62]mm · 2 of 69 slices shown (1 of 3)]
[im 1/69]
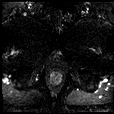
[im 69/69]
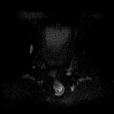

[Series 6: DWI · axial · 3.0mm · 1.75mm/px · 1 of 23 slices shown (2 of 3)]
[im 1/23]
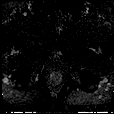

[Series 7: DWI · axial · 3.0mm · 1.75mm/px · 1 of 23 slices shown (3 of 3)]
[im 1/23]
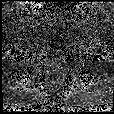

[Series 8: T2 · axial · 3.0mm · 0.56mm/px · 1 of 23 slices shown (2 of 3)]
[im 1/23]
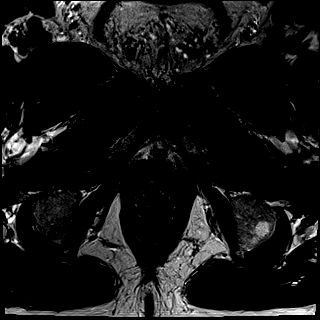

[Series 9: T2 · axial · 1.0mm · 1.04mm/px · z∈[-7,+64]mm · 2 of 72 slices shown (3 of 3)]
[im 1/72]
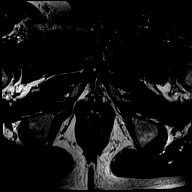
[im 72/72]
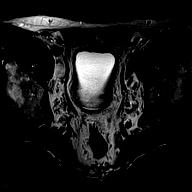

[Series 10: pre t1_twist_tra_dyn · axial · non-contrast · 3.5mm · 0.83mm/px · 1 of 20 slices shown]
[im 1/20]
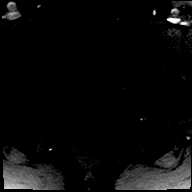

[Series 11: post t1_twist_tra_dyn-copy center · axial · non-contrast · 3.5mm · 0.83mm/px · z∈[-5,+62]mm · 17 of 600 slices shown]
[im 1/600]
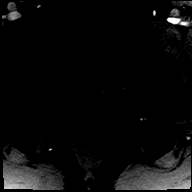
[im 38/600]
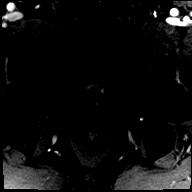
[im 75/600]
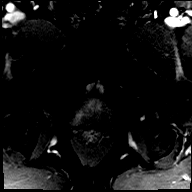
[im 113/600]
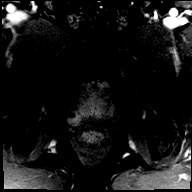
[im 150/600]
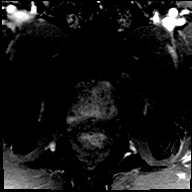
[im 188/600]
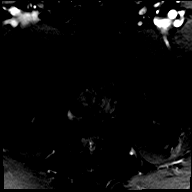
[im 225/600]
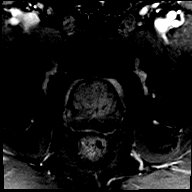
[im 263/600]
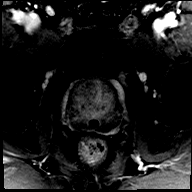
[im 300/600]
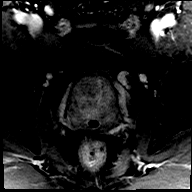
[im 337/600]
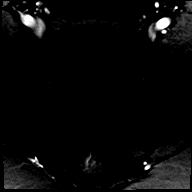
[im 375/600]
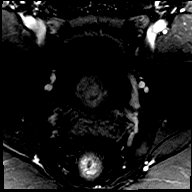
[im 412/600]
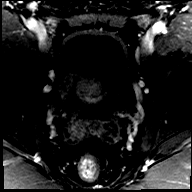
[im 450/600]
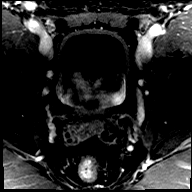
[im 487/600]
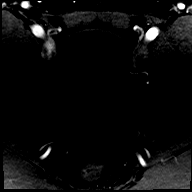
[im 525/600]
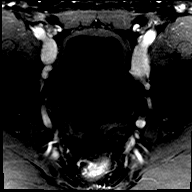
[im 562/600]
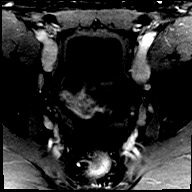
[im 600/600]
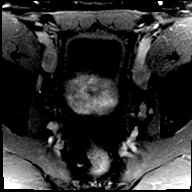

[Series 12: post t1_twist_tra_dyn-copy cent_sub · axial · 3.5mm · 0.83mm/px · z∈[-5,+62]mm · 17 of 579 slices shown]
[im 1/579]
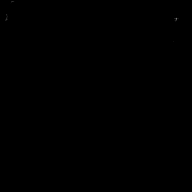
[im 37/579]
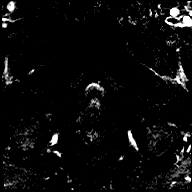
[im 73/579]
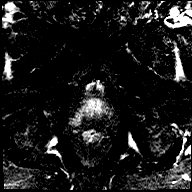
[im 109/579]
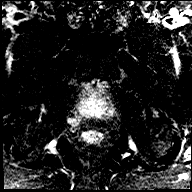
[im 145/579]
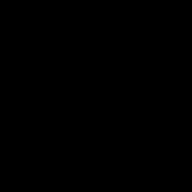
[im 181/579]
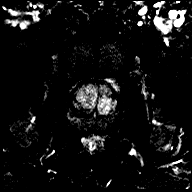
[im 217/579]
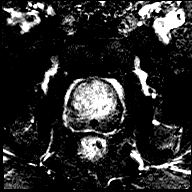
[im 253/579]
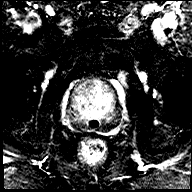
[im 290/579]
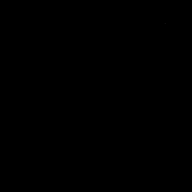
[im 326/579]
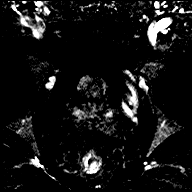
[im 362/579]
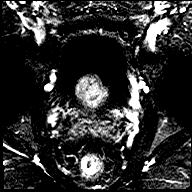
[im 398/579]
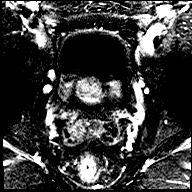
[im 434/579]
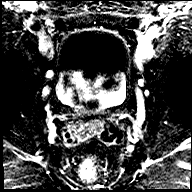
[im 470/579]
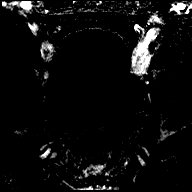
[im 506/579]
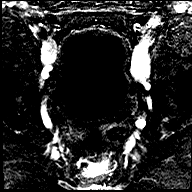
[im 542/579]
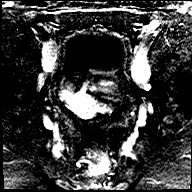
[im 579/579]
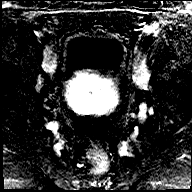

[Series 13: t1_vibe_dixon_tra_f · axial · 2.5mm · 0.91mm/px · z∈[-26,+171]mm · 2 of 80 slices shown]
[im 1/80]
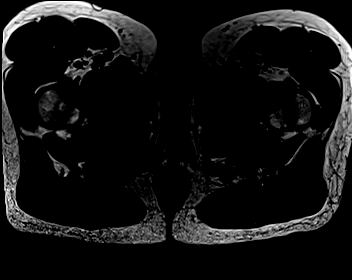
[im 80/80]
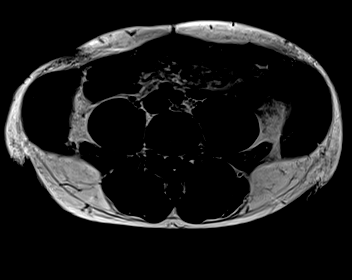

[Series 14: t1_vibe_dixon_tra_w · axial · 2.5mm · 0.91mm/px · z∈[-26,+171]mm · 2 of 80 slices shown]
[im 1/80]
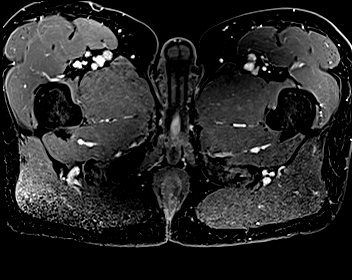
[im 80/80]
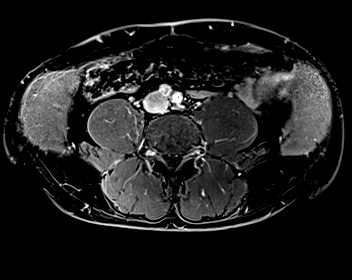

[48 of 48 positions shown; findings below may reference images not displayed]

FINDINGS: Prostate:

Encapsulated nodularity in the transition zone compatible with
benign prostatic hypertrophy.

Region of interest # 1: PI-RADS category 4 lesion of the right
posterolateral peripheral zone in the mid gland and apex, with
focally reduced T2 signal (image 59, series 9), none reduced ADC map
activity (image 19, series 6), and faint focal early enhancement
(image 134, series 12). This measures 0.82 cc (1.8 by 0.8 by
cm). One confounding factor is what appears to be the presence of
some mildly accentuated T1 signal in this region, which could
reflect left over blood products from biopsy.

Posterior midline prostate cyst 1.0 by 0.8 by 1.3 cm, likely a
prostate utricle cyst or mullerian duct cyst.

Volume: 3D volumetric analysis: Prostate volume 57.91 cc (5.2 by
by 5.3 cm).

Transcapsular spread:  Absent

Seminal vesicle involvement: Absent

Neurovascular bundle involvement: Absent

Pelvic adenopathy: Absent

Bone metastasis: Absent

Other findings: Trace free pelvic fluid between the seminal vesicles
and the rectum, cause uncertain.

Relative hypertrophy of pelvic and lower lateral abdominal wall
musculature for age.
IMPRESSION: 1. PI-RADS category 4 of the right posterolateral peripheral zone in
the mid gland and apex. Targeting data sent to [facility].
2. Prostatomegaly in benign prostatic hypertrophy.
3. Prostate utricle cyst versus Mullerian duct cyst posteriorly.
4. Trace free pelvic fluid.

## 2022-12-29 ENCOUNTER — Ambulatory Visit (HOSPITAL_COMMUNITY)
Admission: EM | Admit: 2022-12-29 | Discharge: 2022-12-29 | Disposition: A | Discharge status: Home / Self Care | Payer: Medicare PPO | Attending: Family Medicine | Admitting: Family Medicine

## 2022-12-29 ENCOUNTER — Encounter (HOSPITAL_COMMUNITY): Payer: Self-pay

## 2022-12-29 DIAGNOSIS — J069 Acute upper respiratory infection, unspecified: Secondary | ICD-10-CM | POA: Diagnosis not present

## 2022-12-29 MED ORDER — BENZONATATE 100 MG PO CAPS: ORAL_CAPSULE | ORAL | 0 refills | Status: AC

## 2022-12-29 NOTE — ED Provider Notes (Signed)
[place]   [id number] 12/29/22 Arrival Time: 1324  ASSESSMENT & PLAN:  1. Viral URI with cough    Discussed typical duration of likely viral illness. OTC symptom care as needed.  New Prescriptions   BENZONATATE (TESSALON) 100 MG CAPSULE    Take 1 capsule by mouth every 8 (eight) hours for cough.     Follow-up Information     Benito Mccreedy, MD.   Specialty: Internal Medicine Why: As needed. Contact information: [address] [place] [address] [phone]                 Reviewed expectations re: course of current medical issues. Questions answered. Outlined signs and symptoms indicating need for more acute intervention. Understanding verbalized. After Visit Summary given.   SUBJECTIVE: History from: Patient. Frank Howell is a 72 y.o. male. Reports: cough, chest congestion; x 2-3 days; afebrile. Denies: difficulty breathing. Normal PO intake without n/v/d.  OBJECTIVE:  Vitals:   12/29/22 1104 12/29/22 1106  BP: (!) 154/84 129/78  Pulse: 99   Resp: 16   Temp: 98.2 F (36.8 C)   TempSrc: Oral   SpO2: 98%     General appearance: alert; no distress Eyes: PERRLA; EOMI; conjunctiva normal HENT: [place]; AT; with nasal congestion Neck: supple  Lungs: speaks full sentences without difficulty; unlabored; CTAB Extremities: no edema Skin: warm and dry Neurologic: normal gait Psychological: alert and cooperative; normal mood and affect    Allergies  Allergen Reactions   Malarone [Atovaquone-Proguanil Hcl] Rash   Penicillins     Past Medical History:  Diagnosis Date   Abnormal liver function    Allergy    Asthma    per patient has not had asthma attack in five years   Dermatitis    GERD (gastroesophageal reflux disease)    patient stated GERD is no longer a problem for him   Hyperlipidemia    Hypertension    Psoriasis    Social History   Socioeconomic History   Marital status: Married    Spouse name: Not on  file   Number of children: 3   Years of education: Not on file   Highest education level: Not on file  Occupational History   Not on file  Tobacco Use   Smoking status: Never   Smokeless tobacco: Never  Vaping Use   Vaping Use: Never used  Substance and Sexual Activity   Alcohol use: Yes    Alcohol/week: 0.0 standard drinks of alcohol    Comment: occasionally   Drug use: No   Sexual activity: Yes  Other Topics Concern   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   Has 2 children   Very active and works out twice a day   Nonsmoker occasional drinker   Social Determinants of Radio broadcast assistant Strain: Not on file  Food Insecurity: Not on file  Transportation Needs: Not on file  Physical Activity: Inactive (11/30/2022)   Exercise Vital Sign    Days of Exercise per Week: 0 days    Minutes of Exercise per Session: 0 min  Stress: Stress Concern Present (11/30/2022)   Loving    Feeling of Stress : To some extent  Social Connections: Not on file  Intimate Partner Violence: Not on file   Family History  Problem Relation Age of Onset   Asthma Mother  Hypertension Sister    Hypertension Brother    History reviewed. No pertinent surgical history.   Vanessa Kick, MD 12/29/22 1146

## 2022-12-29 NOTE — ED Triage Notes (Signed)
Pt presents to [facility] with co of cough congestion and fatigue for 3 days. Pt reports tylenol otc

## 2023-01-04 ENCOUNTER — Ambulatory Visit: Payer: Self-pay | Admitting: Licensed Clinical Social Worker

## 2023-01-04 DIAGNOSIS — J4530 Mild persistent asthma, uncomplicated: Secondary | ICD-10-CM | POA: Diagnosis not present

## 2023-01-04 DIAGNOSIS — I10 Essential (primary) hypertension: Secondary | ICD-10-CM | POA: Diagnosis not present

## 2023-01-04 DIAGNOSIS — J80 Acute respiratory distress syndrome: Secondary | ICD-10-CM | POA: Diagnosis not present

## 2023-01-04 DIAGNOSIS — N182 Chronic kidney disease, stage 2 (mild): Secondary | ICD-10-CM | POA: Diagnosis not present

## 2023-01-04 DIAGNOSIS — E782 Mixed hyperlipidemia: Secondary | ICD-10-CM | POA: Diagnosis not present

## 2023-01-04 DIAGNOSIS — J22 Unspecified acute lower respiratory infection: Secondary | ICD-10-CM | POA: Diagnosis not present

## 2023-01-04 DIAGNOSIS — N522 Drug-induced erectile dysfunction: Secondary | ICD-10-CM | POA: Diagnosis not present

## 2023-01-04 NOTE — Patient Instructions (Signed)
Visit Information  Thank you for taking time to visit with me today. Please don't hesitate to contact me if I can be of assistance to you before our next scheduled telephone appointment.  Following are the goals we discussed today:    Our next appointment is by telephone on 02/14/23 at 3:00 PM   Please call the care guide team at [phone] if you need to cancel or reschedule your appointment.   If you are experiencing a Mental Health or [place] or need someone to talk to, please go to [facility] [place] [phone])   Following is a copy of your full plan of care:  Interventions:  LCSW spoke via phone today with client about program support. LCSW spoke via phone today with client about client needs. Client has spoken of support with PCP, Dr. Benito Mccreedy Discussed program support for client with LCSW, RN or Pharmacist Client may have shortness of breath upon ambulation. He has inhaler he uses as needed  Mr. Hanthorn was given information about Care Management services by the embedded care coordination team including:  Care Management services include personalized support from designated clinical staff supervised by his physician, including individualized plan of care and coordination with other care providers 24/7 contact phone numbers for assistance for urgent and routine care needs. The patient may stop CCM services at any time (effective at the end of the month) by phone call to the office staff.  Patient agreed to services and verbal consent obtained.   Norva Riffle.Eudell Mcphee MSW, [place] Holiday representative [facility] Care Management [phone]

## 2023-01-04 NOTE — Patient Outreach (Signed)
  Care Coordination   Follow Up Visit Note   01/04/2023 Name: Frank Howell MRN: [id number] DOB: [birth year]  Frank Howell is a 72 y.o. year old male who sees Osei-Bonsu, Iona Beard, MD for primary care. I spoke with  Frank Howell by phone today.  What matters to the patients health and wellness today?  Patient interested in program support. He may have shortness of breath upon ambulation. He uses inhaler as needed   Goals Addressed             This Visit's Progress    Patient may be interested in program support. LCSW encouraged Frank Howell to talk with his PCP about program support       Interventions:  LCSW spoke via phone today with client about program support. LCSW spoke via phone today with client about client needs. Client has spoken of support with PCP, Frank Howell Discussed program support for client with LCSW, RN or Pharmacist Client may have shortness of breath upon ambulation. He has inhaler he uses as needed     SDOH assessments and interventions completed:  Yes  SDOH Interventions Today    Flowsheet Row Most Recent Value  SDOH Interventions   Physical Activity Interventions Other (Comments)  [client may have shortness of breath occasionally when walking]  Stress Interventions Other (Comment)  [client may have stress in managing medical needs]        Care Coordination Interventions:  Yes, provided   Interventions Today    Flowsheet Row Most Recent Value  Chronic Disease   Chronic disease during today's visit Other  [asthma, shortness of breath]  General Interventions   General Interventions Discussed/Reviewed Community Resources  Exercise Interventions   Exercise Discussed/Reviewed Physical Activity  [client may have shortness of breath when walking]        Follow up plan: Follow up call scheduled for 02/14/23 at 3:00 PM     Encounter Outcome:  Pt. Visit Completed   Frank Howell MSW, [place] Holiday representative [facility] Care  Management [phone]

## 2023-01-12 DIAGNOSIS — C61 Malignant neoplasm of prostate: Secondary | ICD-10-CM | POA: Diagnosis not present

## 2023-01-12 DIAGNOSIS — I10 Essential (primary) hypertension: Secondary | ICD-10-CM | POA: Diagnosis not present

## 2023-01-12 DIAGNOSIS — N183 Chronic kidney disease, stage 3 unspecified: Secondary | ICD-10-CM | POA: Diagnosis not present

## 2023-01-12 DIAGNOSIS — N390 Urinary tract infection, site not specified: Secondary | ICD-10-CM | POA: Diagnosis not present

## 2023-01-13 ENCOUNTER — Other Ambulatory Visit: Payer: Self-pay | Admitting: Internal Medicine

## 2023-01-13 DIAGNOSIS — N183 Chronic kidney disease, stage 3 unspecified: Secondary | ICD-10-CM

## 2023-02-02 ENCOUNTER — Ambulatory Visit: Payer: Self-pay | Admitting: Licensed Clinical Social Worker

## 2023-02-02 NOTE — Patient Outreach (Signed)
  Care Coordination   02/02/2023 Name: Frank Howell MRN: [id number] DOB: 20-Aug-1951   Care Coordination Outreach Attempts:  An unsuccessful telephone outreach was attempted today to offer the patient information about available care coordination services as a benefit of their health plan.   Follow Up Plan:  Additional outreach attempts will be made to offer the patient care coordination information and services.   Encounter Outcome:  No Answer   Care Coordination Interventions:  Yes, provided  Encouraged client to call LCSW next week at [phone]   Norva Riffle.Rozlynn Lippold MSW, [place] Holiday representative [facility] Care Management [phone]

## 2023-02-09 ENCOUNTER — Ambulatory Visit
Admission: RE | Admit: 2023-02-09 | Discharge: 2023-02-09 | Disposition: A | Discharge status: Home / Self Care | Payer: Medicare PPO | Source: Ambulatory Visit | Attending: Internal Medicine | Admitting: Internal Medicine

## 2023-02-09 DIAGNOSIS — N189 Chronic kidney disease, unspecified: Secondary | ICD-10-CM | POA: Diagnosis not present

## 2023-02-09 DIAGNOSIS — N183 Chronic kidney disease, stage 3 unspecified: Secondary | ICD-10-CM

## 2023-02-14 ENCOUNTER — Ambulatory Visit: Payer: Self-pay | Admitting: Licensed Clinical Social Worker

## 2023-02-14 DIAGNOSIS — I10 Essential (primary) hypertension: Secondary | ICD-10-CM | POA: Diagnosis not present

## 2023-02-14 DIAGNOSIS — E231 Drug-induced hypopituitarism: Secondary | ICD-10-CM | POA: Diagnosis not present

## 2023-02-14 DIAGNOSIS — N183 Chronic kidney disease, stage 3 unspecified: Secondary | ICD-10-CM | POA: Diagnosis not present

## 2023-02-14 NOTE — Patient Instructions (Signed)
Visit Information  Thank you for taking time to visit with me today. Please don't hesitate to contact me if I can be of assistance to you.   Following are the goals we discussed today:   Goals Addressed               This Visit's Progress     patient stated he would like to talk further with LCSW about program services (pt-stated)        Interventions:  LCSW spoke via phone today with client. Client has been called before regarding program services and LCSW has talked with client about program services. LCSW and client spoke of client support with PCP Client is scheduled to see PCP in April of 2024.   Reviewed activities of client. Client likes to exercise,as he is able , for activity. He uses inhaler as needed. He has had some history of chest pain; so, client has to pace himself and not overexert himself.  Client has previously said he sees urologist as needed for care. LCSW again provided client with name and phone number of LCSW ([place], [place], [phone]). Client said he would save this name and phone number in his contacts to call SW as needed for SW support     Our next appointment is by telephone on 03/28/23 at 10:00 AM   Please call the care guide team at [phone] if you need to cancel or reschedule your appointment.   If you are experiencing a Mental Health or [place] or need someone to talk to, please go to [facility] [place] [phone])   The patient verbalized understanding of instructions, educational materials, and care plan provided today and DECLINED offer to receive copy of patient instructions, educational materials, and care plan.   The patient has been provided with contact information for the care management team and has been advised to call with any health related questions or concerns.   Norva Riffle.Berlie Hatchel MSW, [place] Holiday representative [facility] Care  Management [phone]

## 2023-02-14 NOTE — Patient Outreach (Signed)
  Care Coordination   Follow Up Visit Note   02/14/2023 Name: Frank Howell MRN: [id number] DOB: Apr 03, 1951  Frank Howell is a 72 y.o. year old male who sees Osei-Bonsu, Iona Beard, MD for primary care. I spoke with  Frank Howell by phone today.  What matters to the patients health and wellness today?  Patient states he would like to talk further with  LCSW about program services   Goals Addressed               This Visit's Progress     patient stated he would like to talk further with LCSW about program services (pt-stated)        Interventions:  LCSW spoke via phone today with client. Client has been called before regarding program services and LCSW has talked with client about program services. LCSW and client spoke of client support with PCP Client is scheduled to see PCP in April of 2024.   Reviewed activities of client. Client likes to exercise,as he is able , for activity. He uses inhaler as needed. He has had some history of chest pain; so, client has to pace himself and not overexert himself.  Client has previously said he sees urologist as needed for care. LCSW again provided client with name and phone number of LCSW (Frank Howell, [phone]). Client said he would save this name and phone number in his contacts to call SW as needed for SW support     SDOH assessments and interventions completed:  Yes  SDOH Interventions Today    Flowsheet Row Most Recent Value  SDOH Interventions   Physical Activity Interventions Other (Comments)  [client likes to exercise occasionally. He uses inhaler as needed. He has to be careful not to overexert himself]  Stress Interventions Other (Comment)  [client may have stress related to managing medical needs]        Care Coordination Interventions:  Yes, provided   Interventions Today    Flowsheet Row Most Recent Value  Chronic Disease   Chronic disease during today's visit Other  [LCSW spoke with Frank Howell about his  needs]  General Interventions   General Interventions Discussed/Reviewed General Interventions Discussed, [employer] program support with LCSW, Therapist, sports, and Pharmacist]  Exercise Interventions   Exercise Discussed/Reviewed Physical Activity  [likes to exercise occasionally]  Education Interventions   Education Provided Provided Education  Provided Verbal Education On [employer]  Mental Health Interventions   Mental Health Discussed/Reviewed Coping Strategies  [discussed coping skills, such as exercise.]  Pharmacy Interventions   Pharmacy Dicussed/Reviewed Medication Adherence        Follow up plan: Follow up call scheduled for 03/28/23 at 10:00 AM    Encounter Outcome:  Pt. Visit Completed   Norva Riffle.Elyanah Farino MSW, [place] Holiday representative [facility] Care Management [phone]

## 2023-02-21 DIAGNOSIS — R0781 Pleurodynia: Secondary | ICD-10-CM | POA: Diagnosis not present

## 2023-02-21 DIAGNOSIS — C61 Malignant neoplasm of prostate: Secondary | ICD-10-CM | POA: Diagnosis not present

## 2023-02-21 DIAGNOSIS — L400 Psoriasis vulgaris: Secondary | ICD-10-CM | POA: Diagnosis not present

## 2023-02-21 DIAGNOSIS — S2232XA Fracture of one rib, left side, initial encounter for closed fracture: Secondary | ICD-10-CM | POA: Diagnosis not present

## 2023-02-21 DIAGNOSIS — N390 Urinary tract infection, site not specified: Secondary | ICD-10-CM | POA: Diagnosis not present

## 2023-02-21 DIAGNOSIS — I10 Essential (primary) hypertension: Secondary | ICD-10-CM | POA: Diagnosis not present

## 2023-02-21 DIAGNOSIS — N183 Chronic kidney disease, stage 3 unspecified: Secondary | ICD-10-CM | POA: Diagnosis not present

## 2023-02-23 DIAGNOSIS — C61 Malignant neoplasm of prostate: Secondary | ICD-10-CM | POA: Diagnosis not present

## 2023-03-02 DIAGNOSIS — C61 Malignant neoplasm of prostate: Secondary | ICD-10-CM | POA: Diagnosis not present

## 2023-03-02 DIAGNOSIS — N401 Enlarged prostate with lower urinary tract symptoms: Secondary | ICD-10-CM | POA: Diagnosis not present

## 2023-03-02 DIAGNOSIS — R3915 Urgency of urination: Secondary | ICD-10-CM | POA: Diagnosis not present

## 2023-03-10 DIAGNOSIS — R0781 Pleurodynia: Secondary | ICD-10-CM | POA: Diagnosis not present

## 2023-03-10 DIAGNOSIS — S2232XG Fracture of one rib, left side, subsequent encounter for fracture with delayed healing: Secondary | ICD-10-CM | POA: Diagnosis not present

## 2023-03-28 ENCOUNTER — Ambulatory Visit: Payer: Self-pay | Admitting: Licensed Clinical Social Worker

## 2023-03-28 NOTE — Patient Instructions (Signed)
Visit Information  Thank you for taking time to visit with me today. Please don't hesitate to contact me if I can be of assistance to you.   Following are the goals we discussed today:   Goals Addressed             This Visit's Progress    Patient said he sometimes gets short of breath on exertion       Interventions: Spoke with patient via phone about patient needs Patient said he sometimes gets short of breath on exertion Patient and LCSW spoke of activity of client. Client likes to go to gym to exercise He said he goes to gym about 2 times per week. Discussed client support with PCP. Client said he has appointment with PCP in July of 2024 Discussed medication procurement. Client said he has his prescribed medications. He said he is eating adequately He is  sleeping adequately. Reviewed program support for client with RN , LCSW and Pharmacist. Encouraged [name] to call LCSW as needed for SW support at [phone].         Our next appointment is by telephone on 05/17/23 at 11:00 AM   Please call the care guide team at [phone] if you need to cancel or reschedule your appointment.   If you are experiencing a Mental Health or Behavioral Health Crisis or need someone to talk to, please go to [facility] [address], [place] [phone])   The patient verbalized understanding of instructions, educational materials, and care plan provided today and DECLINED offer to receive copy of patient instructions, educational materials, and care plan.   The patient has been provided with contact information for the care management team and has been advised to call with any health related questions or concerns.   Frank Howell.Frank Howell MSW, LCSW Licensed Visual merchandiser [facility] Care Management [phone]

## 2023-03-28 NOTE — Patient Outreach (Signed)
  Care Coordination   Follow Up Visit Note   03/28/2023 Name: Blair Brull MRN: [id number] DOB: [birth year]  Zohair Stooksbury is a 72 y.o. year old male who sees Osei-Bonsu, Greggory Stallion, MD for primary care. I spoke with  Drue Novel by phone today.  What matters to the patients health and wellness today?  Client said he sometimes gets short of  breath on exertion    Goals Addressed             This Visit's Progress    Patient said he sometimes gets short of breath on exertion       Interventions: Spoke with patient via phone about patient needs Patient said he sometimes gets short of breath on exertion Patient and LCSW spoke of activity of client. Client likes to go to gym to exercise He said he goes to gym about 2 times per week. Discussed client support with PCP. Client said he has appointment with PCP in July of 2024 Discussed medication procurement. Client said he has his prescribed medications. He said he is eating adequately He is  sleeping adequately. Reviewed program support for client with RN , LCSW and Pharmacist. Encouraged Ferd to call LCSW as needed for SW support at [phone].         SDOH assessments and interventions completed:  Yes  SDOH Interventions Today    Flowsheet Row Most Recent Value  SDOH Interventions   Physical Activity Interventions Other (Comments)  [some walking challenges]  Stress Interventions Provide Counseling  [has some stress in managing medical needs]        Care Coordination Interventions:  Yes, provided   Interventions Today    Flowsheet Row Most Recent Value  Chronic Disease   Chronic disease during today's visit Other  [spoke via phone with client about client needs]  General Interventions   General Interventions Discussed/Reviewed General Interventions Discussed, Community Resources  [reviewed program support]  Exercise Interventions   Exercise Discussed/Reviewed Physical Activity  [client likes to go to gym occasionally to  exercise]  Physical Activity Discussed/Reviewed Physical Activity Reviewed  [sometimes gets short of breath on exertion]  Education Interventions   Education Provided Provided Education  Provided Verbal Education On Community Resources  Mental Health Interventions   Mental Health Discussed/Reviewed Anxiety, Coping Strategies  [client feels that his mood is stable. he likes to try to exercise as he is able]  Nutrition Interventions   Nutrition Discussed/Reviewed Nutrition Discussed  Pharmacy Interventions   Pharmacy Dicussed/Reviewed Pharmacy Topics Discussed        Follow up plan: Follow up call scheduled for 05/17/23 at 11:00 AM    Encounter Outcome:  Pt. Visit Completed   Kelton Pillar.Jourdan Durbin MSW, LCSW Licensed Visual merchandiser [facility] Care Management [phone]

## 2023-05-04 DIAGNOSIS — Z79899 Other long term (current) drug therapy: Secondary | ICD-10-CM | POA: Diagnosis not present

## 2023-05-04 DIAGNOSIS — L400 Psoriasis vulgaris: Secondary | ICD-10-CM | POA: Diagnosis not present

## 2023-05-10 DIAGNOSIS — R062 Wheezing: Secondary | ICD-10-CM | POA: Diagnosis not present

## 2023-05-10 DIAGNOSIS — R0602 Shortness of breath: Secondary | ICD-10-CM | POA: Diagnosis not present

## 2023-05-10 DIAGNOSIS — R0789 Other chest pain: Secondary | ICD-10-CM | POA: Diagnosis not present

## 2023-05-10 DIAGNOSIS — R9431 Abnormal electrocardiogram [ECG] [EKG]: Secondary | ICD-10-CM | POA: Diagnosis not present

## 2023-05-12 ENCOUNTER — Ambulatory Visit: Payer: Medicare PPO | Admitting: Cardiology

## 2023-05-13 ENCOUNTER — Encounter: Payer: Self-pay | Admitting: Cardiology

## 2023-05-13 ENCOUNTER — Ambulatory Visit: Payer: Medicare PPO | Admitting: Cardiology

## 2023-05-13 VITALS — BP 122/68 | HR 65 | Resp 16 | Ht 68.0 in | Wt 196.8 lb

## 2023-05-13 DIAGNOSIS — R9431 Abnormal electrocardiogram [ECG] [EKG]: Secondary | ICD-10-CM | POA: Diagnosis not present

## 2023-05-13 DIAGNOSIS — I10 Essential (primary) hypertension: Secondary | ICD-10-CM | POA: Diagnosis not present

## 2023-05-13 DIAGNOSIS — E782 Mixed hyperlipidemia: Secondary | ICD-10-CM | POA: Diagnosis not present

## 2023-05-13 NOTE — Progress Notes (Signed)
Date:  05/13/2023   ID:  Frank Howell, DOB 05/24/1951, MRN [id number]  PCP:  Jackie Plum, MD  Cardiologist:  Tessa Lerner, DO, [facility] (established care 05/13/2021)  Date: 05/13/23 Last Office Visit: 06/23/2021  Chief Complaint  Patient presents with   Follow-up    Abnormal EKG    HPI  Frank Howell is a 72 y.o. male whose past medical history and cardiovascular risk factors include: hypertension, hyperlipidemia.   Patient was referred to the practice back in 2022 for blood pressure management.  With uptitration of medical therapy his blood pressures have improved significantly.  And given his elevated ASCVD risk score he did undergo echo and stress test at that time.  He was lost to follow-up until now.  This past Monday he was working in his garden and felt some shortness of breath and [facility] was performed as well as chest x-ray.  Patient endorses that the chest x-ray was normal but EKG was interpreted to be abnormal and is now referred to [place] for further evaluation and management.  Clinically denies anginal chest pain or heart failure symptoms.  Home blood pressures are well-controlled.  Patient states that his lipids were recently checked by his PCP.  In hindsight, patient states that his symptoms which he was relating to shortness of breath could have been heartburn secondary to increase intake of ginger on a daily basis.  FUNCTIONAL STATUS: Walks at least 45 minutes to an hour 4 times a week.  ALLERGIES: Allergies  Allergen Reactions   Malarone [Atovaquone-Proguanil Hcl] Rash   Penicillins     MEDICATION LIST PRIOR TO VISIT: Current Meds  Medication Sig   albuterol (VENTOLIN HFA) 108 (90 Base) MCG/ACT inhaler Inhale 1-2 puffs into the lungs every 4 (four) hours as needed for wheezing or shortness of breath.   amLODipine (NORVASC) 10 MG tablet Take 10 mg by mouth every evening.   augmented betamethasone dipropionate (DIPROLENE-AF)  0.05 % ointment APPLICATIONS APPLY ON THE SKIN APPLY TWICE DAILY TO LEGS   benzonatate (TESSALON) 100 MG capsule Take 1 capsule by mouth every 8 (eight) hours for cough.   budesonide-formoterol (SYMBICORT) 160-4.5 MCG/ACT inhaler Inhale 2 puffs into the lungs 2 (two) times daily.   cholecalciferol (VITAMIN D3) 25 MCG (1000 UT) tablet Take 1,000 Units by mouth daily.   finasteride (PROSCAR) 5 MG tablet Take 5 mg by mouth daily.   losartan (COZAAR) 100 MG tablet Take 1 tablet (100 mg total) by mouth daily.   vitamin C (ASCORBIC ACID) 250 MG tablet Take 500 mg by mouth daily.     PAST MEDICAL HISTORY: Past Medical History:  Diagnosis Date   Abnormal liver function    Allergy    Asthma    per patient has not had asthma attack in five years   Dermatitis    GERD (gastroesophageal reflux disease)    patient stated GERD is no longer a problem for him   Hyperlipidemia    Hypertension    Psoriasis     PAST SURGICAL HISTORY: History reviewed. No pertinent surgical history.  FAMILY HISTORY: The patient family history includes Asthma in his mother; Hypertension in his brother and sister.  SOCIAL HISTORY:  The patient  reports that he has never smoked. He has never used smokeless tobacco. He reports current alcohol use. He reports that he does not use drugs.  REVIEW OF SYSTEMS: Review of Systems  Constitutional: Negative for chills and fever.  HENT:  Negative for hoarse voice and nosebleeds.   Eyes:  Negative for discharge, double vision and pain.  Cardiovascular:  Negative for chest pain, claudication, dyspnea on exertion, leg swelling, near-syncope, orthopnea, palpitations, paroxysmal nocturnal dyspnea and syncope.  Respiratory:  Negative for hemoptysis and shortness of breath.   Musculoskeletal:  Negative for muscle cramps and myalgias.  Gastrointestinal:  Negative for abdominal pain, constipation, diarrhea, hematemesis, hematochezia, melena, nausea and vomiting.  Neurological:   Negative for dizziness and light-headedness.    PHYSICAL EXAM:    05/13/2023    3:38 PM 12/29/2022   11:06 AM 12/29/2022   11:04 AM  Vitals with BMI  Height 5\' 8"     Weight 196 lbs 13 oz    BMI 29.93    Systolic 122 129 784  Diastolic 68 78 84  Pulse 65  99    Physical Exam  Constitutional: No distress.  Age appropriate, hemodynamically stable.   Neck: No JVD present.  Cardiovascular: Normal rate, regular rhythm, S1 normal, S2 normal, intact distal pulses and normal pulses. Exam reveals no gallop, no S3 and no S4.  No murmur heard. Pulmonary/Chest: Effort normal and breath sounds normal. No stridor. He has no wheezes. He has no rales.  Abdominal: Soft. Bowel sounds are normal. He exhibits no distension. There is no abdominal tenderness.  Musculoskeletal:        General: No edema.     Cervical back: Neck supple.  Neurological: He is alert and oriented to person, place, and time. He has intact cranial nerves (2-12).  Skin: Skin is warm and moist.   CARDIAC DATABASE: EKG: May 13, 2023: Sinus rhythm, 88 bpm, left axis, LAE, without underlying injury pattern.  No significant change compared to 05/13/2021  Echocardiogram: 05/26/2021: Normal LV systolic function with visual EF 65-70%. Left ventricle cavity is normal in size. Moderate left ventricular hypertrophy. Normal global wall motion. Normal diastolic filling pattern, normal LAP. Left atrial cavity is normal in size. The interatrial Septum is thin and mobile but appears to be intact by 2D and CF Doppler interrogation. No significant valvular abnormalities. No prior study for comparison.   Stress Testing: Exercise Myoview stress test 06/08/2021: 1 Day Rest/Stress Protocol. Functional status: Fair. Chest pain: No. Reason for stopping exercise: Fatigue/weakness. Hypertensive response to exercise: No. Exercise time 30 seconds on Bruce protocol, achieved 7.05 METS, and 91% of age-predicted maximum heart rate. Stress  ECG negative for ischemia.  Normal myocardial perfusion without convincing evidence of reversible ischemia or prior infarct. LVEF 68%, wall thickness is preserved without regional wall motion abnormalities. Low risk study.  Heart Catheterization: None  Renal artery duplex 05/26/2021: No evidence of renal artery occlusive disease in either renal artery. Mild increase in the right renal artery velocity of no hemodynamic significance. Normal intrarenal vascular perfusion is noted in both kidneys. Renal length is within normal limits for both kidneys. Mild diffuse plaque observed in the abdominal aorta. Normal abdominal aortaflow velocities noted.  LABORATORY DATA:    Latest Ref Rng & Units 10/21/2016    9:04 AM 10/04/2016   10:30 AM 09/30/2016   10:34 AM  CBC  WBC 3.8 - 10.8 K/uL 5.3  13.0  11.0   Hemoglobin 13.2 - 17.1 g/dL 69.6  29.5  28.4   Hematocrit 38.5 - 50.0 % 47.3  44.3  44.0   Platelets 140 - 400 K/uL 219          Latest Ref Rng & Units 10/15/2020    4:01 PM 04/18/2020  12:07 PM 01/04/2020    4:29 PM  CMP  Glucose 65 - 99 mg/dL 84  161  90   BUN 8 - 27 mg/dL 14  9  16    Creatinine 0.76 - 1.27 mg/dL 0.96  0.45  4.09   Sodium 134 - 144 mmol/L 141  140  142   Potassium 3.5 - 5.2 mmol/L 4.0  3.6  4.4   Chloride 96 - 106 mmol/L 104  103  102   CO2 20 - 29 mmol/L 26  26  26    Calcium 8.6 - 10.2 mg/dL 9.3  9.1  9.4   Total Protein 6.0 - 8.5 g/dL  7.1    Total Bilirubin 0.0 - 1.2 mg/dL  1.0    Alkaline Phos 48 - 121 IU/L  83    AST 0 - 40 IU/L  15    ALT 0 - 44 IU/L  13      Lipid Panel     Component Value Date/Time   CHOL 176 04/18/2020 1207   TRIG 88 04/18/2020 1207   HDL 60 04/18/2020 1207   CHOLHDL 2.9 04/18/2020 1207   CHOLHDL 3.6 04/29/2016 1203   VLDL 17 04/29/2016 1203   LDLCALC 100 (H) 04/18/2020 1207   LABVLDL 16 04/18/2020 1207    No components found for: "NTPROBNP" No results for input(s): "PROBNP" in the last 8760 hours. No results for input(s):  "TSH" in the last 8760 hours.  BMP No results for input(s): "NA", "K", "CL", "CO2", "GLUCOSE", "BUN", "CREATININE", "CALCIUM", "GFRNONAA", "GFRAA" in the last 8760 hours.   HEMOGLOBIN A1C Lab Results  Component Value Date   HGBA1C 5.5 10/21/2016   MPG 111 10/21/2016   External Labs:  Date Collected: 04/16/2021 , information obtained by primary care provider Potassium: 3.6 Creatinine 1.37 mg/dL. eGFR: 60 mL/min per 1.73 m Hemoglobin: 16.3 g/dL and hematocrit: 81.1 % Lipid profile: Total cholesterol 208 , triglycerides 104 , HDL 64 , LDL 123 AST: 17 , ALT: 17 , alkaline phosphatase: 93   IMPRESSION:    ICD-10-CM   1. Abnormal EKG  R94.31     2. Benign hypertension  I10 EKG 12-Lead    3. Mixed hyperlipidemia  E78.2 CT CARDIAC SCORING (SELF PAY ONLY)       RECOMMENDATIONS: Evyn Putzier is a 72 y.o. male whose past medical history and cardiac risk factors include: Hypertension.  Patient presents to the office for evaluation/review of recent EKG.  Earlier this week when he was working in the garden he felt shortness of breath and went to [facility] urgent care where an EKG was performed.  He brings in a copy for review.  Underlying rhythm is sinus with early repolarization.  EKG today illustrates similar findings.  He is not having any anginal chest pain or heart failure symptoms.  Prior echo and stress test results reviewed as part of medical decision making today.  He also had screening test which included evaluation of bilateral carotids peak systolic velocities and ankle-brachial index.  Given the elevated 10-year risk of ASCVD shared decision was to proceed with coronary calcium score for further risk stratification.  He also had labs with his PCP in the recent past and we will provide a copy of the next office visit.  Reemphasized importance of primary prevention and improving his modifiable cardiovascular risk factors.  FINAL MEDICATION LIST END OF ENCOUNTER: No  orders of the defined types were placed in this encounter.    Current Outpatient Medications:  albuterol (VENTOLIN HFA) 108 (90 Base) MCG/ACT inhaler, Inhale 1-2 puffs into the lungs every 4 (four) hours as needed for wheezing or shortness of breath., Disp: 18 g, Rfl: 1   amLODipine (NORVASC) 10 MG tablet, Take 10 mg by mouth every evening., Disp: , Rfl:    augmented betamethasone dipropionate (DIPROLENE-AF) 0.05 % ointment, APPLICATIONS APPLY ON THE SKIN APPLY TWICE DAILY TO LEGS, Disp: , Rfl: 3   benzonatate (TESSALON) 100 MG capsule, Take 1 capsule by mouth every 8 (eight) hours for cough., Disp: 21 capsule, Rfl: 0   budesonide-formoterol (SYMBICORT) 160-4.5 MCG/ACT inhaler, Inhale 2 puffs into the lungs 2 (two) times daily., Disp: , Rfl:    cholecalciferol (VITAMIN D3) 25 MCG (1000 UT) tablet, Take 1,000 Units by mouth daily., Disp: , Rfl:    finasteride (PROSCAR) 5 MG tablet, Take 5 mg by mouth daily., Disp: , Rfl:    losartan (COZAAR) 100 MG tablet, Take 1 tablet (100 mg total) by mouth daily., Disp: 90 tablet, Rfl: 1   vitamin C (ASCORBIC ACID) 250 MG tablet, Take 500 mg by mouth daily., Disp: , Rfl:   Orders Placed This Encounter  Procedures   CT CARDIAC SCORING (SELF PAY ONLY)   EKG 12-Lead   There are no Patient Instructions on file for this visit.   --Continue cardiac medications as reconciled in final medication list. --Return in about 6 weeks (around 06/24/2023) for Review test results. Or sooner if needed. --Continue follow-up with your primary care physician regarding the management of your other chronic comorbid conditions.  Patient's questions and concerns were addressed to his satisfaction. He voices understanding of the instructions provided during this encounter.   This note was created using a voice recognition software as a result there may be grammatical errors inadvertently enclosed that do not reflect the nature of this encounter. Every attempt is made to correct  such errors.  Tessa Lerner, [place], [facility]  Pager:  [phone] Office: [phone]

## 2023-05-17 ENCOUNTER — Ambulatory Visit: Payer: Self-pay | Admitting: Licensed Clinical Social Worker

## 2023-05-17 NOTE — Patient Outreach (Signed)
  Care Coordination   05/17/2023 Name: Frank Howell MRN: [id number] DOB: 02-Dec-1950   Care Coordination Outreach Attempts:  An unsuccessful telephone outreach was attempted today to offer the patient information about available care coordination services.  Follow Up Plan:  Additional outreach attempts will be made to offer the patient care coordination information and services.   Encounter Outcome:  No Answer   Care Coordination Interventions:  No, not indicated    Kelton Pillar.Anthonio Mizzell MSW, LCSW Licensed Visual merchandiser [facility] Care Management [phone]

## 2023-05-23 ENCOUNTER — Telehealth: Payer: Self-pay | Admitting: *Deleted

## 2023-05-23 NOTE — Progress Notes (Signed)
  Care Coordination Note  05/23/2023 Name: Frank Howell MRN: [id number] DOB: 1951/04/10  Frank Howell is a 72 y.o. year old male who is a primary care patient of Osei-Bonsu, Greggory Stallion, MD and is actively engaged with the care management team. I reached out to Drue Novel by phone today to assist with re-scheduling a follow up visit with the Licensed Clinical Social Worker  Follow up plan: Unsuccessful telephone outreach attempt made. A HIPAA compliant phone message was left for the patient providing contact information and requesting a return call.   [facility]  Care Coordination Care Guide  Direct Dial: [phone]

## 2023-05-31 NOTE — Progress Notes (Signed)
  Care Coordination Note  05/31/2023 Name: [name] MRN: [id number] DOB: [birth year]  Frank Howell is a 72 y.o. year old male who is a primary care patient of Osei-Bonsu, Greggory Stallion, MD and is actively engaged with the care management team. I reached out to Drue Novel by phone today to assist with re-scheduling a follow up visit with the Licensed Clinical Social Worker  Follow up plan: Unsuccessful telephone outreach attempt made. A HIPAA compliant phone message was left for the patient providing contact information and requesting a return call.  We have been unable to make contact with the patient for follow up. The care management team is available to follow up with the patient after provider conversation with the patient regarding recommendation for care management engagement and subsequent re-referral to the care management team.   [facility] Coordination Care Guide  Direct Dial: [phone]

## 2023-05-31 NOTE — Progress Notes (Signed)
  Care Coordination Note  05/31/2023 Name: Barth Repp MRN: [id number] DOB: Mar 29, 1951  Wyley Yung is a 72 y.o. year old male who is a primary care patient of Osei-Bonsu, Greggory Stallion, MD and is actively engaged with the care management team. I reached out to Drue Novel by phone today to assist with re-scheduling a follow up visit with the Licensed Clinical Social Worker  Follow up plan: Telephone appointment with care management team member scheduled for:06/07/23  [facility]  Care Coordination Care Guide  Direct Dial: [phone]

## 2023-06-02 ENCOUNTER — Ambulatory Visit (HOSPITAL_COMMUNITY)
Admission: RE | Admit: 2023-06-02 | Discharge: 2023-06-02 | Disposition: A | Discharge status: Home / Self Care | Payer: Medicare PPO | Source: Ambulatory Visit | Attending: Cardiology | Admitting: Cardiology

## 2023-06-02 DIAGNOSIS — S29012A Strain of muscle and tendon of back wall of thorax, initial encounter: Secondary | ICD-10-CM | POA: Diagnosis not present

## 2023-06-02 DIAGNOSIS — E782 Mixed hyperlipidemia: Secondary | ICD-10-CM | POA: Insufficient documentation

## 2023-06-02 DIAGNOSIS — M5032 Other cervical disc degeneration, mid-cervical region, unspecified level: Secondary | ICD-10-CM | POA: Diagnosis not present

## 2023-06-02 DIAGNOSIS — M9902 Segmental and somatic dysfunction of thoracic region: Secondary | ICD-10-CM | POA: Diagnosis not present

## 2023-06-02 DIAGNOSIS — M9901 Segmental and somatic dysfunction of cervical region: Secondary | ICD-10-CM | POA: Diagnosis not present

## 2023-06-07 ENCOUNTER — Encounter: Payer: Self-pay | Admitting: Licensed Clinical Social Worker

## 2023-06-08 DIAGNOSIS — S29012A Strain of muscle and tendon of back wall of thorax, initial encounter: Secondary | ICD-10-CM | POA: Diagnosis not present

## 2023-06-08 DIAGNOSIS — M9901 Segmental and somatic dysfunction of cervical region: Secondary | ICD-10-CM | POA: Diagnosis not present

## 2023-06-08 DIAGNOSIS — M9902 Segmental and somatic dysfunction of thoracic region: Secondary | ICD-10-CM | POA: Diagnosis not present

## 2023-06-08 DIAGNOSIS — M5032 Other cervical disc degeneration, mid-cervical region, unspecified level: Secondary | ICD-10-CM | POA: Diagnosis not present

## 2023-06-10 ENCOUNTER — Other Ambulatory Visit: Payer: Self-pay

## 2023-06-13 DIAGNOSIS — N182 Chronic kidney disease, stage 2 (mild): Secondary | ICD-10-CM | POA: Diagnosis not present

## 2023-06-13 DIAGNOSIS — I10 Essential (primary) hypertension: Secondary | ICD-10-CM | POA: Diagnosis not present

## 2023-06-13 DIAGNOSIS — N522 Drug-induced erectile dysfunction: Secondary | ICD-10-CM | POA: Diagnosis not present

## 2023-06-13 DIAGNOSIS — J4530 Mild persistent asthma, uncomplicated: Secondary | ICD-10-CM | POA: Diagnosis not present

## 2023-06-13 DIAGNOSIS — Z131 Encounter for screening for diabetes mellitus: Secondary | ICD-10-CM | POA: Diagnosis not present

## 2023-06-13 DIAGNOSIS — E782 Mixed hyperlipidemia: Secondary | ICD-10-CM | POA: Diagnosis not present

## 2023-06-13 DIAGNOSIS — Z0001 Encounter for general adult medical examination with abnormal findings: Secondary | ICD-10-CM | POA: Diagnosis not present

## 2023-06-13 DIAGNOSIS — Z125 Encounter for screening for malignant neoplasm of prostate: Secondary | ICD-10-CM | POA: Diagnosis not present

## 2023-06-15 DIAGNOSIS — S29012A Strain of muscle and tendon of back wall of thorax, initial encounter: Secondary | ICD-10-CM | POA: Diagnosis not present

## 2023-06-15 DIAGNOSIS — M9902 Segmental and somatic dysfunction of thoracic region: Secondary | ICD-10-CM | POA: Diagnosis not present

## 2023-06-15 DIAGNOSIS — M9901 Segmental and somatic dysfunction of cervical region: Secondary | ICD-10-CM | POA: Diagnosis not present

## 2023-06-15 DIAGNOSIS — M5032 Other cervical disc degeneration, mid-cervical region, unspecified level: Secondary | ICD-10-CM | POA: Diagnosis not present

## 2023-06-16 ENCOUNTER — Telehealth: Payer: Self-pay | Admitting: Cardiology

## 2023-06-16 NOTE — Telephone Encounter (Signed)
Spoke with Erskine Squibb and she needs for the CT to be signed and unlocked so the radiologist can read the report.  Did inform her that Dr.Tobb is not in office.   Spoke with Dr.Hilty and he did message Dr. Servando Salina and she stated she will look at it.  Called Erskine Squibb back and she states it does look like it has be fixed

## 2023-06-16 NOTE — Telephone Encounter (Signed)
Radiology called and mentioned that Dr. Servando Salina hasn't signed off of paperwork and the Radiologist needs it asap

## 2023-06-22 DIAGNOSIS — M9902 Segmental and somatic dysfunction of thoracic region: Secondary | ICD-10-CM | POA: Diagnosis not present

## 2023-06-22 DIAGNOSIS — S29012A Strain of muscle and tendon of back wall of thorax, initial encounter: Secondary | ICD-10-CM | POA: Diagnosis not present

## 2023-06-22 DIAGNOSIS — M9901 Segmental and somatic dysfunction of cervical region: Secondary | ICD-10-CM | POA: Diagnosis not present

## 2023-06-22 DIAGNOSIS — M5032 Other cervical disc degeneration, mid-cervical region, unspecified level: Secondary | ICD-10-CM | POA: Diagnosis not present

## 2023-06-30 ENCOUNTER — Encounter: Payer: Self-pay | Admitting: Cardiology

## 2023-06-30 ENCOUNTER — Ambulatory Visit: Payer: Medicare PPO | Admitting: Cardiology

## 2023-06-30 VITALS — BP 137/82 | HR 86 | Resp 16 | Ht 68.0 in | Wt 164.0 lb

## 2023-06-30 DIAGNOSIS — E782 Mixed hyperlipidemia: Secondary | ICD-10-CM

## 2023-06-30 DIAGNOSIS — I10 Essential (primary) hypertension: Secondary | ICD-10-CM | POA: Diagnosis not present

## 2023-06-30 NOTE — Progress Notes (Signed)
Date:  06/30/2023   ID:  Frank Howell, DOB 21-Mar-1951, MRN [id number]  PCP:  Jackie Plum, MD  Cardiologist:  Tessa Lerner, DO, [facility] (established care 05/13/2021)  Date: 06/30/23 Last Office Visit: 05/13/2023  Chief Complaint  Patient presents with   Results   Follow-up    HPI  Frank Howell is a 72 y.o. male whose past medical history and cardiovascular risk factors include: hypertension, hyperlipidemia.   Has been followed by the practice since 2022 for blood pressure management.  However in June 2024 was referred back to the practice after the chest pain episode for which she had gone to urgent care.  Chest x-ray at the urgent care was reported to be normal but EKG was interpreted to be abnormal he was concerned and he came back to the office for further evaluation.  On further questioning patient states that his chest pain likely could have been heartburn due to increased intake of ginger.  However at the last office visit the shared decision was to proceed with coronary calcium score for further risk stratification.  Since last office visit he did have a coronary calcium score and the total CAC is 0.  Clinically, denies anginal chest pain or heart failure symptoms.  Overall functional capacity remains stable.  FUNCTIONAL STATUS: Walks at least 45 minutes to an hour 4 times a week.  ALLERGIES: Allergies  Allergen Reactions   Malarone [Atovaquone-Proguanil Hcl] Rash   Penicillins     MEDICATION LIST PRIOR TO VISIT: Current Meds  Medication Sig   albuterol (VENTOLIN HFA) 108 (90 Base) MCG/ACT inhaler Inhale 1-2 puffs into the lungs every 4 (four) hours as needed for wheezing or shortness of breath.   amLODipine (NORVASC) 10 MG tablet Take 10 mg by mouth every evening.   augmented betamethasone dipropionate (DIPROLENE-AF) 0.05 % ointment APPLICATIONS APPLY ON THE SKIN APPLY TWICE DAILY TO LEGS   benzonatate (TESSALON) 100 MG capsule Take 1 capsule by mouth every 8  (eight) hours for cough.   budesonide-formoterol (SYMBICORT) 160-4.5 MCG/ACT inhaler Inhale 2 puffs into the lungs 2 (two) times daily.   cholecalciferol (VITAMIN D3) 25 MCG (1000 UT) tablet Take 1,000 Units by mouth daily.   finasteride (PROSCAR) 5 MG tablet Take 5 mg by mouth daily.   losartan (COZAAR) 100 MG tablet Take 1 tablet (100 mg total) by mouth daily.   vitamin C (ASCORBIC ACID) 250 MG tablet Take 500 mg by mouth daily.     PAST MEDICAL HISTORY: Past Medical History:  Diagnosis Date   Abnormal liver function    Allergy    Asthma    per patient has not had asthma attack in five years   Dermatitis    GERD (gastroesophageal reflux disease)    patient stated GERD is no longer a problem for him   Hyperlipidemia    Hypertension    Psoriasis     PAST SURGICAL HISTORY: History reviewed. No pertinent surgical history.  FAMILY HISTORY: The patient family history includes Asthma in his mother; Hypertension in his brother and sister.  SOCIAL HISTORY:  The patient  reports that he has never smoked. He has never used smokeless tobacco. He reports current alcohol use. He reports that he does not use drugs.  REVIEW OF SYSTEMS: Review of Systems  Constitutional: Negative for chills and fever.  HENT:  Negative for hoarse voice and nosebleeds.   Eyes:  Negative for discharge, double vision and pain.  Cardiovascular:  Negative for chest pain, claudication, dyspnea on  exertion, leg swelling, near-syncope, orthopnea, palpitations, paroxysmal nocturnal dyspnea and syncope.  Respiratory:  Negative for hemoptysis and shortness of breath.   Musculoskeletal:  Negative for muscle cramps and myalgias.  Gastrointestinal:  Negative for abdominal pain, constipation, diarrhea, hematemesis, hematochezia, melena, nausea and vomiting.  Neurological:  Negative for dizziness and light-headedness.    PHYSICAL EXAM:    06/30/2023    1:34 PM 05/13/2023    3:38 PM 12/29/2022   11:06 AM  Vitals with BMI   Height 5\' 8"  5\' 8"    Weight 164 lbs 196 lbs 13 oz   BMI 24.94 29.93   Systolic 137 122 621  Diastolic 82 68 78  Pulse 86 65     Physical Exam  Constitutional: No distress.  Age appropriate, hemodynamically stable.   Neck: No JVD present.  Cardiovascular: Normal rate, regular rhythm, S1 normal, S2 normal, intact distal pulses and normal pulses. Exam reveals no gallop, no S3 and no S4.  No murmur heard. Pulmonary/Chest: Effort normal and breath sounds normal. No stridor. He has no wheezes. He has no rales.  Abdominal: Soft. Bowel sounds are normal. He exhibits no distension. There is no abdominal tenderness.  Musculoskeletal:        General: No edema.     Cervical back: Neck supple.  Neurological: He is alert and oriented to person, place, and time. He has intact cranial nerves (2-12).  Skin: Skin is warm and moist.   CARDIAC DATABASE: EKG: May 13, 2023: Sinus rhythm, 88 bpm, left axis, LAE, without underlying injury pattern.  No significant change compared to 05/13/2021  Echocardiogram: 05/26/2021: Normal LV systolic function with visual EF 65-70%. Left ventricle cavity is normal in size. Moderate left ventricular hypertrophy. Normal global wall motion. Normal diastolic filling pattern, normal LAP. Left atrial cavity is normal in size. The interatrial Septum is thin and mobile but appears to be intact by 2D and CF Doppler interrogation. No significant valvular abnormalities. No prior study for comparison.   Stress Testing: Exercise Myoview stress test 06/08/2021: 1 Day Rest/Stress Protocol. Functional status: Fair. Chest pain: No. Reason for stopping exercise: Fatigue/weakness. Hypertensive response to exercise: No. Exercise time 30 seconds on Bruce protocol, achieved 7.05 METS, and 91% of age-predicted maximum heart rate. Stress ECG negative for ischemia.  Normal myocardial perfusion without convincing evidence of reversible ischemia or prior infarct. LVEF 68%,  wall thickness is preserved without regional wall motion abnormalities. Low risk study.  Heart Catheterization: None  Renal artery duplex 05/26/2021: No evidence of renal artery occlusive disease in either renal artery. Mild increase in the right renal artery velocity of no hemodynamic significance. Normal intrarenal vascular perfusion is noted in both kidneys. Renal length is within normal limits for both kidneys. Mild diffuse plaque observed in the abdominal aorta. Normal abdominal aortaflow velocities noted.  CT Cardiac Scoring: June 02, 2023 Total CAC 0 AU. Noncardiac findings: No definitive abnormality seen within the visible extracardiac structures of chest  LABORATORY DATA:    Latest Ref Rng & Units 10/21/2016    9:04 AM 10/04/2016   10:30 AM 09/30/2016   10:34 AM  CBC  WBC 3.8 - 10.8 K/uL 5.3  13.0  11.0   Hemoglobin 13.2 - 17.1 g/dL 30.8  65.7  84.6   Hematocrit 38.5 - 50.0 % 47.3  44.3  44.0   Platelets 140 - 400 K/uL 219          Latest Ref Rng & Units 10/15/2020    4:01 PM 04/18/2020  12:07 PM 01/04/2020    4:29 PM  CMP  Glucose 65 - 99 mg/dL 84  409  90   BUN 8 - 27 mg/dL 14  9  16    Creatinine 0.76 - 1.27 mg/dL 8.11  9.14  7.82   Sodium 134 - 144 mmol/L 141  140  142   Potassium 3.5 - 5.2 mmol/L 4.0  3.6  4.4   Chloride 96 - 106 mmol/L 104  103  102   CO2 20 - 29 mmol/L 26  26  26    Calcium 8.6 - 10.2 mg/dL 9.3  9.1  9.4   Total Protein 6.0 - 8.5 g/dL  7.1    Total Bilirubin 0.0 - 1.2 mg/dL  1.0    Alkaline Phos 48 - 121 IU/L  83    AST 0 - 40 IU/L  15    ALT 0 - 44 IU/L  13      Lipid Panel     Component Value Date/Time   CHOL 176 04/18/2020 1207   TRIG 88 04/18/2020 1207   HDL 60 04/18/2020 1207   CHOLHDL 2.9 04/18/2020 1207   CHOLHDL 3.6 04/29/2016 1203   VLDL 17 04/29/2016 1203   LDLCALC 100 (H) 04/18/2020 1207   LABVLDL 16 04/18/2020 1207    No components found for: "NTPROBNP" No results for input(s): "PROBNP" in the last 8760  hours. No results for input(s): "TSH" in the last 8760 hours.  BMP No results for input(s): "NA", "K", "CL", "CO2", "GLUCOSE", "BUN", "CREATININE", "CALCIUM", "GFRNONAA", "GFRAA" in the last 8760 hours.   HEMOGLOBIN A1C Lab Results  Component Value Date   HGBA1C 5.5 10/21/2016   MPG 111 10/21/2016   External Labs:  Date Collected: 04/16/2021 , information obtained by primary care provider Potassium: 3.6 Creatinine 1.37 mg/dL. eGFR: 60 mL/min per 1.73 m Hemoglobin: 16.3 g/dL and hematocrit: 95.6 % Lipid profile: Total cholesterol 208 , triglycerides 104 , HDL 64 , LDL 123 AST: 17 , ALT: 17 , alkaline phosphatase: 93   IMPRESSION:    ICD-10-CM   1. Benign hypertension  I10     2. Mixed hyperlipidemia  E78.2         RECOMMENDATIONS: [place]on Suhre is a 72 y.o. male whose past medical history and cardiac risk factors include: Hypertension.  Initially referred to the practice in 2022 for evaluation of hypertension.  With uptitration of medical therapy her blood pressures at home have improved significantly.  He presented to the office in July 2024 after having an episode of chest pain and he had gone to urgent care and was told that his EKG is abnormal.  At last office visit patient denied any reoccurrence of chest pain or heart failure symptoms.  Patient stated that in hindsight it was likely secondary to heartburn related to increased intake of ginger on a regular basis.   EKG independently reviewed as noted above.   Prior echo and stress test results reviewed.   Given his 10-year risk of ASCVD the shared decision was to proceed with coronary artery calcification scoring for further risk stratification.  His total CAC is 0 which is quite reassuring.  Since last office visit he has not had any anginal chest pain or heart failure symptoms.   His overall physical endurance and functional capacity remains stable.   Home blood pressures currently run between 118-130 mmHg.  He  recently had labs with PCP and will arrange a copy to be sent to [place].  Since he is  currently asymptomatic, prior cardiac workup reviewed, and a coronary calcium score of 0.   We will hold off on additional testing at this time.  Patient is agreeable with the plan of care and will reach out sooner if symptoms resurface.  I would like to see him back on annual basis sooner if needed.  FINAL MEDICATION LIST END OF ENCOUNTER: No orders of the defined types were placed in this encounter.    Current Outpatient Medications:    albuterol (VENTOLIN HFA) 108 (90 Base) MCG/ACT inhaler, Inhale 1-2 puffs into the lungs every 4 (four) hours as needed for wheezing or shortness of breath., Disp: 18 g, Rfl: 1   amLODipine (NORVASC) 10 MG tablet, Take 10 mg by mouth every evening., Disp: , Rfl:    augmented betamethasone dipropionate (DIPROLENE-AF) 0.05 % ointment, APPLICATIONS APPLY ON THE SKIN APPLY TWICE DAILY TO LEGS, Disp: , Rfl: 3   benzonatate (TESSALON) 100 MG capsule, Take 1 capsule by mouth every 8 (eight) hours for cough., Disp: 21 capsule, Rfl: 0   budesonide-formoterol (SYMBICORT) 160-4.5 MCG/ACT inhaler, Inhale 2 puffs into the lungs 2 (two) times daily., Disp: , Rfl:    cholecalciferol (VITAMIN D3) 25 MCG (1000 UT) tablet, Take 1,000 Units by mouth daily., Disp: , Rfl:    finasteride (PROSCAR) 5 MG tablet, Take 5 mg by mouth daily., Disp: , Rfl:    losartan (COZAAR) 100 MG tablet, Take 1 tablet (100 mg total) by mouth daily., Disp: 90 tablet, Rfl: 1   vitamin C (ASCORBIC ACID) 250 MG tablet, Take 500 mg by mouth daily., Disp: , Rfl:   No orders of the defined types were placed in this encounter.  There are no Patient Instructions on file for this visit.   --Continue cardiac medications as reconciled in final medication list. --Return in about 1 year (around 06/29/2024) for Annual follow up visit, BP. Or sooner if needed. --Continue follow-up with your primary care physician regarding the  management of your other chronic comorbid conditions.  Patient's questions and concerns were addressed to his satisfaction. He voices understanding of the instructions provided during this encounter.   This note was created using a voice recognition software as a result there may be grammatical errors inadvertently enclosed that do not reflect the nature of this encounter. Every attempt is made to correct such errors.  Tessa Lerner, [place], [facility]  Pager:  [phone] Office: [phone]

## 2023-08-01 DIAGNOSIS — E782 Mixed hyperlipidemia: Secondary | ICD-10-CM | POA: Diagnosis not present

## 2023-08-01 DIAGNOSIS — N522 Drug-induced erectile dysfunction: Secondary | ICD-10-CM | POA: Diagnosis not present

## 2023-08-01 DIAGNOSIS — N182 Chronic kidney disease, stage 2 (mild): Secondary | ICD-10-CM | POA: Diagnosis not present

## 2023-08-01 DIAGNOSIS — I10 Essential (primary) hypertension: Secondary | ICD-10-CM | POA: Diagnosis not present

## 2023-08-01 DIAGNOSIS — Z0001 Encounter for general adult medical examination with abnormal findings: Secondary | ICD-10-CM | POA: Diagnosis not present

## 2023-08-01 DIAGNOSIS — J4530 Mild persistent asthma, uncomplicated: Secondary | ICD-10-CM | POA: Diagnosis not present

## 2023-08-16 DIAGNOSIS — S30813A Abrasion of scrotum and testes, initial encounter: Secondary | ICD-10-CM | POA: Diagnosis not present

## 2023-08-22 DIAGNOSIS — I10 Essential (primary) hypertension: Secondary | ICD-10-CM | POA: Diagnosis not present

## 2023-08-22 DIAGNOSIS — C61 Malignant neoplasm of prostate: Secondary | ICD-10-CM | POA: Diagnosis not present

## 2023-08-22 DIAGNOSIS — N390 Urinary tract infection, site not specified: Secondary | ICD-10-CM | POA: Diagnosis not present

## 2023-08-22 DIAGNOSIS — N183 Chronic kidney disease, stage 3 unspecified: Secondary | ICD-10-CM | POA: Diagnosis not present

## 2023-08-31 DIAGNOSIS — C61 Malignant neoplasm of prostate: Secondary | ICD-10-CM | POA: Diagnosis not present

## 2023-09-07 DIAGNOSIS — C61 Malignant neoplasm of prostate: Secondary | ICD-10-CM | POA: Diagnosis not present

## 2023-09-07 DIAGNOSIS — N401 Enlarged prostate with lower urinary tract symptoms: Secondary | ICD-10-CM | POA: Diagnosis not present

## 2023-09-07 DIAGNOSIS — R3915 Urgency of urination: Secondary | ICD-10-CM | POA: Diagnosis not present

## 2023-09-26 DIAGNOSIS — E782 Mixed hyperlipidemia: Secondary | ICD-10-CM | POA: Diagnosis not present

## 2023-09-26 DIAGNOSIS — Z23 Encounter for immunization: Secondary | ICD-10-CM | POA: Diagnosis not present

## 2023-09-26 DIAGNOSIS — I10 Essential (primary) hypertension: Secondary | ICD-10-CM | POA: Diagnosis not present

## 2023-09-26 DIAGNOSIS — N182 Chronic kidney disease, stage 2 (mild): Secondary | ICD-10-CM | POA: Diagnosis not present

## 2023-09-26 DIAGNOSIS — N522 Drug-induced erectile dysfunction: Secondary | ICD-10-CM | POA: Diagnosis not present

## 2023-09-26 DIAGNOSIS — J4530 Mild persistent asthma, uncomplicated: Secondary | ICD-10-CM | POA: Diagnosis not present

## 2023-10-06 ENCOUNTER — Ambulatory Visit (HOSPITAL_COMMUNITY)
Admission: EM | Admit: 2023-10-06 | Discharge: 2023-10-06 | Disposition: A | Discharge status: Home / Self Care | Payer: Medicare PPO | Attending: Family Medicine | Admitting: Family Medicine

## 2023-10-06 ENCOUNTER — Encounter (HOSPITAL_COMMUNITY): Payer: Self-pay | Admitting: Family Medicine

## 2023-10-06 DIAGNOSIS — I10 Essential (primary) hypertension: Secondary | ICD-10-CM

## 2023-10-06 DIAGNOSIS — T148XXA Other injury of unspecified body region, initial encounter: Secondary | ICD-10-CM

## 2023-10-06 NOTE — ED Provider Notes (Signed)
[facility]    CSN: [id number] Arrival date & time: 10/06/23  1017      History   Chief Complaint Chief Complaint  Patient presents with   Skin Problem    HPI Frank Howell is a 72 y.o. male.   Frank Howell is a 72 year old male who presents with some discomfort under his armpits.  He believes he nicked himself about 7 days ago with a razor when shaving and since then has had some discomfort, especially in the shower.  Yesterday and today he noticed some increased pain in the shower but did not see any swelling, redness, or purulent discharge.  He has not had any swollen lymph nodes, pain rating down the arm, numbness or weakness.  He does not have any rashes anywhere else and denies any fever or chills.  He has been putting deodorant over the top of this which helps some but was worried about being something more serious.  He does not have a history of folliculitis that he knows of.  He did not take his blood pressure medications today but does not have any chest pain, palpitations, headache, vision changes, dizziness.  The history is provided by the patient.    Past Medical History:  Diagnosis Date   Abnormal liver function    Allergy    Asthma    per patient has not had asthma attack in five years   Dermatitis    GERD (gastroesophageal reflux disease)    patient stated GERD is no longer a problem for him   Hyperlipidemia    Hypertension    Psoriasis     Patient Active Problem List   Diagnosis Date Noted   Pain in the chest    Chest pain 08/08/2015   Psoriasis 08/08/2015   Asthma 01/10/2012   Hypertension 01/10/2012   Hyperlipidemia 01/10/2012   GERD 01/20/2010   FECAL OCCULT BLOOD 01/20/2010    History reviewed. No pertinent surgical history.     Home Medications    Prior to Admission medications   Medication Sig Start Date End Date Taking? Authorizing Provider  albuterol (VENTOLIN HFA) 108 (90 Base) MCG/ACT inhaler Inhale 1-2 puffs into the  lungs every 4 (four) hours as needed for wheezing or shortness of breath. 07/05/19   Shade Flood, MD  amLODipine (NORVASC) 10 MG tablet Take 10 mg by mouth every evening.    [provider]  augmented betamethasone dipropionate (DIPROLENE-AF) 0.05 % ointment APPLICATIONS APPLY ON THE SKIN APPLY TWICE DAILY TO LEGS 02/22/18   [provider]  benzonatate (TESSALON) 100 MG capsule Take 1 capsule by mouth every 8 (eight) hours for cough. 12/29/22   Mardella Layman, MD  budesonide-formoterol (SYMBICORT) 160-4.5 MCG/ACT inhaler Inhale 2 puffs into the lungs 2 (two) times daily.    [provider]  cholecalciferol (VITAMIN D3) 25 MCG (1000 UT) tablet Take 1,000 Units by mouth daily.    [provider]  finasteride (PROSCAR) 5 MG tablet Take 5 mg by mouth daily. 12/06/22   [provider]  losartan (COZAAR) 100 MG tablet Take 1 tablet (100 mg total) by mouth daily. 10/15/20   Shade Flood, MD  vitamin C (ASCORBIC ACID) 250 MG tablet Take 500 mg by mouth daily.    [provider]    Family History Family History  Problem Relation Age of Onset   Asthma Mother    Hypertension Sister    Hypertension Brother     Social History Social History  Tobacco Use   Smoking status: Never   Smokeless tobacco: Never  Vaping Use   Vaping status: Never Used  Substance Use Topics   Alcohol use: Yes    Alcohol/week: 0.0 standard drinks of alcohol    Comment: occasionally   Drug use: No     Allergies   Malarone [atovaquone-proguanil hcl] and Penicillins   Review of Systems Review of Systems  Constitutional:  Negative for chills and fever.  Musculoskeletal:  Negative for arthralgias, joint swelling and myalgias.  Skin:        Several small areas of irritation under the armpits without swelling, warmth, increasing pain, or drainage.     Physical Exam Triage Vital Signs ED Triage Vitals [10/06/23 1037]  Encounter Vitals Group     BP (!)  175/94     Systolic BP Percentile      Diastolic BP Percentile      Pulse Rate 80     Resp 18     Temp 97.9 F (36.6 C)     Temp Source Oral     SpO2 98 %     Weight      Height      Head Circumference      Peak Flow      Pain Score 2     Pain Loc      Pain Education      Exclude from Growth Chart    No data found.  Updated Vital Signs BP (!) 175/94 (BP Location: Right Arm)   Pulse 80   Temp 97.9 F (36.6 C) (Oral)   Resp 18   Ht 5\' 8"  (1.727 m)   Wt 73.5 kg   SpO2 98%   BMI 24.63 kg/m   Visual Acuity Right Eye Distance:   Left Eye Distance:   Bilateral Distance:    Right Eye Near:   Left Eye Near:    Bilateral Near:     Physical Exam Vitals reviewed.  Constitutional:      Appearance: Normal appearance. He is normal weight.  Musculoskeletal:     Comments: Range of motion of the shoulder is normal  Skin:    Capillary Refill: Capillary refill takes less than 2 seconds.     Comments: Several small, healing, superficial abrasions over the apex of the axilla bilaterally.  The largest abrasion is half a centimeter in diameter on the left.  There appears to be some possible early folliculitis but no marked edema, erythema, warmth, drainage.  No other rashes, surrounding erythema, streaking.  Neurological:     General: No focal deficit present.     Mental Status: He is alert.  Psychiatric:        Mood and Affect: Mood normal.        Behavior: Behavior normal.      UC Treatments / Results  Labs (all labs ordered are listed, but only abnormal results are displayed) Labs Reviewed - No data to display  EKG   Radiology No results found.  Procedures Procedures (including critical care time)  Medications Ordered in UC Medications - No data to display  Initial Impression / Assessment and Plan / UC Course  I have reviewed the triage vital signs and the nursing notes.  Pertinent labs & imaging results that were available during my care of the patient were  reviewed by me and considered in my medical decision making (see chart for details).     Superficial abrasion of skin - Frank Howell appears to have  well-healing abrasions over the skin folds in the armpits.  However, since this is 7 days out and he did notice some increased sensitivity with a showering along with physical exam there is a possibility of early folliculitis.  There is no current signs of infection. - We discussed the use of petroleum jelly for a friction barrier to allow for further healing as well as warm compresses at night to treat for possible developing folliculitis.  We will avoid use of antiperspirants until the abrasions are well-healed. - I do not believe there is any infection or fungal rash at this time.  Signs and symptoms of infection discussed with patient for follow-up at urgent care. - The patient voiced understanding and agreement with plan.  Hypertension - We discussed his elevated blood pressure reading in the clinic.  He is not symptomatic and no red flag symptoms. - I encouraged consistent use of his blood pressure medications and follow-up with his PCP.   Final Clinical Impressions(s) / UC Diagnoses   Final diagnoses:  Superficial abrasion     Discharge Instructions      You have a superficial abrasion of the skin under your armpits.  It does not appear to be infected at this time. Continue good hygiene to the area to avoid infection. Keep the areas covered with petroleum jelly to decrease friction and allow for further healing. You may have some early developing folliculitis.  In order to assist with this maintain good hygiene with warm compresses at night as we discussed. Continue to monitor for signs of infection which include increasing swelling, warmth, pain, or drainage.  If you develop any of these return to urgent care. Avoid further shaving of the areas or use of antiperspirants until the wounds are completely healed.      ED  Prescriptions   None    PDMP not reviewed this encounter.   Ivor Messier, MD 10/06/23 1104

## 2023-10-06 NOTE — Discharge Instructions (Addendum)
You have a superficial abrasion of the skin under your armpits.  It does not appear to be infected at this time. Continue good hygiene to the area to avoid infection. Keep the areas covered with petroleum jelly to decrease friction and allow for further healing. You may have some early developing folliculitis.  In order to assist with this maintain good hygiene with warm compresses at night as we discussed. Continue to monitor for signs of infection which include increasing swelling, warmth, pain, or drainage.  If you develop any of these return to urgent care. Avoid further shaving of the areas or use of antiperspirants until the wounds are completely healed. Ensure to take your blood pressure medication and monitor at.  If you develop any symptoms of dizziness, confusion, headache, vision changes, chest pain, or palpitations return to the emergency room.

## 2023-10-06 NOTE — ED Triage Notes (Signed)
Pt presents with pain under right/left axilla areas at this time. Pt states "I believe I caught myself with the razor under both armpits last week. It has been causing me severe pain ever since." Pt has been applying deodorant directly to area. Pt denies taking/applying medications.

## 2023-10-24 DIAGNOSIS — J45909 Unspecified asthma, uncomplicated: Secondary | ICD-10-CM | POA: Diagnosis not present

## 2023-10-24 DIAGNOSIS — E785 Hyperlipidemia, unspecified: Secondary | ICD-10-CM | POA: Diagnosis not present

## 2023-10-24 DIAGNOSIS — N400 Enlarged prostate without lower urinary tract symptoms: Secondary | ICD-10-CM | POA: Diagnosis not present

## 2023-10-24 DIAGNOSIS — I700 Atherosclerosis of aorta: Secondary | ICD-10-CM | POA: Diagnosis not present

## 2023-10-24 DIAGNOSIS — N3943 Post-void dribbling: Secondary | ICD-10-CM | POA: Diagnosis not present

## 2023-10-24 DIAGNOSIS — Z8249 Family history of ischemic heart disease and other diseases of the circulatory system: Secondary | ICD-10-CM | POA: Diagnosis not present

## 2023-10-24 DIAGNOSIS — I129 Hypertensive chronic kidney disease with stage 1 through stage 4 chronic kidney disease, or unspecified chronic kidney disease: Secondary | ICD-10-CM | POA: Diagnosis not present

## 2023-10-24 DIAGNOSIS — N1831 Chronic kidney disease, stage 3a: Secondary | ICD-10-CM | POA: Diagnosis not present

## 2023-10-24 DIAGNOSIS — C61 Malignant neoplasm of prostate: Secondary | ICD-10-CM | POA: Diagnosis not present

## 2023-11-07 DIAGNOSIS — L738 Other specified follicular disorders: Secondary | ICD-10-CM | POA: Diagnosis not present

## 2023-11-07 DIAGNOSIS — L400 Psoriasis vulgaris: Secondary | ICD-10-CM | POA: Diagnosis not present

## 2023-11-07 DIAGNOSIS — Z79899 Other long term (current) drug therapy: Secondary | ICD-10-CM | POA: Diagnosis not present

## 2023-11-08 DIAGNOSIS — J4530 Mild persistent asthma, uncomplicated: Secondary | ICD-10-CM | POA: Diagnosis not present

## 2023-11-08 DIAGNOSIS — N522 Drug-induced erectile dysfunction: Secondary | ICD-10-CM | POA: Diagnosis not present

## 2023-11-08 DIAGNOSIS — E782 Mixed hyperlipidemia: Secondary | ICD-10-CM | POA: Diagnosis not present

## 2023-11-08 DIAGNOSIS — N182 Chronic kidney disease, stage 2 (mild): Secondary | ICD-10-CM | POA: Diagnosis not present

## 2023-11-08 DIAGNOSIS — I10 Essential (primary) hypertension: Secondary | ICD-10-CM | POA: Diagnosis not present

## 2023-11-09 DIAGNOSIS — J4530 Mild persistent asthma, uncomplicated: Secondary | ICD-10-CM | POA: Diagnosis not present

## 2023-11-09 DIAGNOSIS — E782 Mixed hyperlipidemia: Secondary | ICD-10-CM | POA: Diagnosis not present

## 2023-11-09 DIAGNOSIS — N1831 Chronic kidney disease, stage 3a: Secondary | ICD-10-CM | POA: Diagnosis not present

## 2023-11-09 DIAGNOSIS — I129 Hypertensive chronic kidney disease with stage 1 through stage 4 chronic kidney disease, or unspecified chronic kidney disease: Secondary | ICD-10-CM | POA: Diagnosis not present

## 2023-11-09 DIAGNOSIS — N522 Drug-induced erectile dysfunction: Secondary | ICD-10-CM | POA: Diagnosis not present

## 2023-11-26 ENCOUNTER — Encounter (HOSPITAL_COMMUNITY): Payer: Self-pay | Admitting: Emergency Medicine

## 2023-11-26 ENCOUNTER — Ambulatory Visit (HOSPITAL_COMMUNITY)
Admission: EM | Admit: 2023-11-26 | Discharge: 2023-11-26 | Disposition: A | Discharge status: Home / Self Care | Payer: Medicare PPO

## 2023-11-26 DIAGNOSIS — J069 Acute upper respiratory infection, unspecified: Secondary | ICD-10-CM

## 2023-11-26 NOTE — ED Triage Notes (Signed)
 Pt had cough-productive, congestion, sneezing since Monday. Took OTC cold and cough medications.

## 2023-11-26 NOTE — ED Provider Notes (Signed)
 [facility]    CSN: [id number] Arrival date & time: 11/26/23  1001      History   Chief Complaint Chief Complaint  Patient presents with   Cough   Nasal Congestion    HPI Frank Howell is a 73 y.o. male.   Patient complains of a cough and congestion.  Patient reports the symptoms began on Monday he reports having a fever and bodyaches.  Patient reports he is feeling better but he still has a runny nose and a cough and thought he should get checked out.  Patient reports he has a past medical history of asthma he states he has not had any problems with his asthma.  Patient is not currently having any fevers  The history is provided by the patient. No language interpreter was used.  Cough   Past Medical History:  Diagnosis Date   Abnormal liver function    Allergy    Asthma    per patient has not had asthma attack in five years   Dermatitis    GERD (gastroesophageal reflux disease)    patient stated GERD is no longer a problem for him   Hyperlipidemia    Hypertension    Psoriasis     Patient Active Problem List   Diagnosis Date Noted   Pain in the chest    Chest pain 08/08/2015   Psoriasis 08/08/2015   Asthma 01/10/2012   Hypertension 01/10/2012   Hyperlipidemia 01/10/2012   GERD 01/20/2010   FECAL OCCULT BLOOD 01/20/2010    History reviewed. No pertinent surgical history.     Home Medications    Prior to Admission medications   Medication Sig Start Date End Date Taking? Authorizing Provider  albuterol  (VENTOLIN  HFA) 108 (90 Base) MCG/ACT inhaler Inhale 1-2 puffs into the lungs every 4 (four) hours as needed for wheezing or shortness of breath. 07/05/19   Levora Reyes SAUNDERS, MD  amLODipine  (NORVASC ) 10 MG tablet Take 10 mg by mouth every evening.    [provider]  augmented betamethasone dipropionate (DIPROLENE-AF) 0.05 % ointment APPLICATIONS APPLY ON THE SKIN APPLY TWICE DAILY TO LEGS 02/22/18   [provider]  benzonatate   (TESSALON ) 100 MG capsule Take 1 capsule by mouth every 8 (eight) hours for cough. 12/29/22   Rolinda Rogue, MD  budesonide -formoterol  (SYMBICORT) 160-4.5 MCG/ACT inhaler Inhale 2 puffs into the lungs 2 (two) times daily.    [provider]  cholecalciferol (VITAMIN D3) 25 MCG (1000 UT) tablet Take 1,000 Units by mouth daily.    [provider]  finasteride (PROSCAR) 5 MG tablet Take 5 mg by mouth daily. 12/06/22   [provider]  losartan  (COZAAR ) 100 MG tablet Take 1 tablet (100 mg total) by mouth daily. 10/15/20   Levora Reyes SAUNDERS, MD  vitamin C (ASCORBIC ACID) 250 MG tablet Take 500 mg by mouth daily.    [provider]    Family History Family History  Problem Relation Age of Onset   Asthma Mother    Hypertension Sister    Hypertension Brother     Social History Social History   Tobacco Use   Smoking status: Never   Smokeless tobacco: Never  Vaping Use   Vaping status: Never Used  Substance Use Topics   Alcohol use: Yes    Alcohol/week: 0.0 standard drinks of alcohol    Comment: occasionally   Drug use: No     Allergies   Malarone  [atovaquone -proguanil hcl] and Penicillins   Review of  Systems Review of Systems  Respiratory:  Positive for cough.   All other systems reviewed and are negative.    Physical Exam Triage Vital Signs ED Triage Vitals  Encounter Vitals Group     BP 11/26/23 1016 (!) 144/90     Systolic BP Percentile --      Diastolic BP Percentile --      Pulse Rate 11/26/23 1016 90     Resp 11/26/23 1016 18     Temp 11/26/23 1016 98.6 F (37 C)     Temp Source 11/26/23 1016 Oral     SpO2 11/26/23 1016 96 %     Weight --      Height --      Head Circumference --      Peak Flow --      Pain Score 11/26/23 1015 4     Pain Loc --      Pain Education --      Exclude from Growth Chart --    No data found.  Updated Vital Signs BP (!) 144/90 (BP Location: Left Arm)   Pulse 90   Temp 98.6 F (37 C) (Oral)    Resp 18   SpO2 96%   Visual Acuity Right Eye Distance:   Left Eye Distance:   Bilateral Distance:    Right Eye Near:   Left Eye Near:    Bilateral Near:     Physical Exam Vitals and nursing note reviewed.  Constitutional:      Appearance: He is well-developed.  HENT:     Head: Normocephalic.     Right Ear: Tympanic membrane normal.     Left Ear: Tympanic membrane normal.     Nose: Nose normal.     Mouth/Throat:     Mouth: Mucous membranes are moist.  Eyes:     Pupils: Pupils are equal, round, and reactive to light.  Cardiovascular:     Rate and Rhythm: Normal rate.  Pulmonary:     Effort: Pulmonary effort is normal.  Abdominal:     General: There is no distension.  Musculoskeletal:        General: Normal range of motion.     Cervical back: Normal range of motion.  Skin:    General: Skin is warm.  Neurological:     Mental Status: He is alert and oriented to person, place, and time.      UC Treatments / Results  Labs (all labs ordered are listed, but only abnormal results are displayed) Labs Reviewed - No data to display  EKG   Radiology No results found.  Procedures Procedures (including critical care time)  Medications Ordered in UC Medications - No data to display  Initial Impression / Assessment and Plan / UC Course  I have reviewed the triage vital signs and the nursing notes.  Pertinent labs & imaging results that were available during my care of the patient were reviewed by me and considered in my medical decision making (see chart for details).     I suspect patient had a viral illness most likely influenza.  As he is getting better and is on day 5 I do not feel like he needs testing at this time.  Patient is in agreement with this I have advised him to continue over-the-counter medicines he is advised to follow-up with his primary care physician for recheck if symptoms persist. Final Clinical Impressions(s) / UC Diagnoses   Final  diagnoses:  Viral upper respiratory tract infection  Discharge Instructions   None    ED Prescriptions   None    PDMP not reviewed this encounter. An After Visit Summary was printed and given to the patient.        Flint Sonny POUR, PA-C 11/26/23 1047

## 2023-12-08 DIAGNOSIS — E782 Mixed hyperlipidemia: Secondary | ICD-10-CM | POA: Diagnosis not present

## 2023-12-08 DIAGNOSIS — J22 Unspecified acute lower respiratory infection: Secondary | ICD-10-CM | POA: Diagnosis not present

## 2023-12-08 DIAGNOSIS — N1831 Chronic kidney disease, stage 3a: Secondary | ICD-10-CM | POA: Diagnosis not present

## 2023-12-08 DIAGNOSIS — J4530 Mild persistent asthma, uncomplicated: Secondary | ICD-10-CM | POA: Diagnosis not present

## 2023-12-08 DIAGNOSIS — N522 Drug-induced erectile dysfunction: Secondary | ICD-10-CM | POA: Diagnosis not present

## 2023-12-08 DIAGNOSIS — I129 Hypertensive chronic kidney disease with stage 1 through stage 4 chronic kidney disease, or unspecified chronic kidney disease: Secondary | ICD-10-CM | POA: Diagnosis not present

## 2023-12-08 DIAGNOSIS — J9801 Acute bronchospasm: Secondary | ICD-10-CM | POA: Diagnosis not present

## 2023-12-15 DIAGNOSIS — J209 Acute bronchitis, unspecified: Secondary | ICD-10-CM | POA: Diagnosis not present

## 2023-12-21 DIAGNOSIS — I10 Essential (primary) hypertension: Secondary | ICD-10-CM | POA: Diagnosis not present

## 2023-12-21 DIAGNOSIS — N183 Chronic kidney disease, stage 3 unspecified: Secondary | ICD-10-CM | POA: Diagnosis not present

## 2023-12-23 DIAGNOSIS — I10 Essential (primary) hypertension: Secondary | ICD-10-CM | POA: Diagnosis not present

## 2023-12-23 DIAGNOSIS — N183 Chronic kidney disease, stage 3 unspecified: Secondary | ICD-10-CM | POA: Diagnosis not present

## 2023-12-23 DIAGNOSIS — R82998 Other abnormal findings in urine: Secondary | ICD-10-CM | POA: Diagnosis not present

## 2023-12-23 DIAGNOSIS — I2510 Atherosclerotic heart disease of native coronary artery without angina pectoris: Secondary | ICD-10-CM | POA: Diagnosis not present

## 2023-12-23 DIAGNOSIS — N390 Urinary tract infection, site not specified: Secondary | ICD-10-CM | POA: Diagnosis not present

## 2024-01-10 DIAGNOSIS — B372 Candidiasis of skin and nail: Secondary | ICD-10-CM | POA: Diagnosis not present

## 2024-04-13 DIAGNOSIS — B372 Candidiasis of skin and nail: Secondary | ICD-10-CM | POA: Diagnosis not present

## 2024-05-09 DIAGNOSIS — L400 Psoriasis vulgaris: Secondary | ICD-10-CM | POA: Diagnosis not present

## 2024-05-09 DIAGNOSIS — C61 Malignant neoplasm of prostate: Secondary | ICD-10-CM | POA: Diagnosis not present

## 2024-05-16 DIAGNOSIS — R351 Nocturia: Secondary | ICD-10-CM | POA: Diagnosis not present

## 2024-05-16 DIAGNOSIS — C61 Malignant neoplasm of prostate: Secondary | ICD-10-CM | POA: Diagnosis not present

## 2024-05-16 DIAGNOSIS — N401 Enlarged prostate with lower urinary tract symptoms: Secondary | ICD-10-CM | POA: Diagnosis not present

## 2024-05-16 DIAGNOSIS — R972 Elevated prostate specific antigen [PSA]: Secondary | ICD-10-CM | POA: Diagnosis not present

## 2024-05-17 DIAGNOSIS — E782 Mixed hyperlipidemia: Secondary | ICD-10-CM | POA: Diagnosis not present

## 2024-05-17 DIAGNOSIS — N522 Drug-induced erectile dysfunction: Secondary | ICD-10-CM | POA: Diagnosis not present

## 2024-05-17 DIAGNOSIS — Z0001 Encounter for general adult medical examination with abnormal findings: Secondary | ICD-10-CM | POA: Diagnosis not present

## 2024-05-17 DIAGNOSIS — J4530 Mild persistent asthma, uncomplicated: Secondary | ICD-10-CM | POA: Diagnosis not present

## 2024-05-17 DIAGNOSIS — N1831 Chronic kidney disease, stage 3a: Secondary | ICD-10-CM | POA: Diagnosis not present

## 2024-05-17 DIAGNOSIS — I129 Hypertensive chronic kidney disease with stage 1 through stage 4 chronic kidney disease, or unspecified chronic kidney disease: Secondary | ICD-10-CM | POA: Diagnosis not present

## 2024-05-17 DIAGNOSIS — Z131 Encounter for screening for diabetes mellitus: Secondary | ICD-10-CM | POA: Diagnosis not present

## 2024-05-17 DIAGNOSIS — Z125 Encounter for screening for malignant neoplasm of prostate: Secondary | ICD-10-CM | POA: Diagnosis not present

## 2024-05-21 ENCOUNTER — Encounter: Payer: Self-pay | Admitting: Urology

## 2024-05-21 ENCOUNTER — Other Ambulatory Visit: Payer: Self-pay | Admitting: Urology

## 2024-05-21 DIAGNOSIS — C61 Malignant neoplasm of prostate: Secondary | ICD-10-CM

## 2024-06-25 DIAGNOSIS — N183 Chronic kidney disease, stage 3 unspecified: Secondary | ICD-10-CM | POA: Diagnosis not present

## 2024-06-29 ENCOUNTER — Ambulatory Visit
Admission: RE | Admit: 2024-06-29 | Discharge: 2024-06-29 | Disposition: A | Discharge status: Home / Self Care | Source: Ambulatory Visit | Attending: Urology | Admitting: Urology

## 2024-06-29 ENCOUNTER — Ambulatory Visit: Payer: Self-pay | Admitting: Cardiology

## 2024-06-29 DIAGNOSIS — C61 Malignant neoplasm of prostate: Secondary | ICD-10-CM | POA: Diagnosis not present

## 2024-06-29 MED ORDER — GADOPICLENOL 0.5 MMOL/ML IV SOLN
7.0000 mL | Freq: Once | INTRAVENOUS | Status: AC | PRN
  Administered 2024-06-29: 7 mL via INTRAVENOUS

## 2024-07-04 DIAGNOSIS — C61 Malignant neoplasm of prostate: Secondary | ICD-10-CM | POA: Diagnosis not present

## 2024-07-04 DIAGNOSIS — A209 Plague, unspecified: Secondary | ICD-10-CM | POA: Diagnosis not present

## 2024-07-04 DIAGNOSIS — I10 Essential (primary) hypertension: Secondary | ICD-10-CM | POA: Diagnosis not present

## 2024-07-04 DIAGNOSIS — N390 Urinary tract infection, site not specified: Secondary | ICD-10-CM | POA: Diagnosis not present

## 2024-07-04 DIAGNOSIS — N183 Chronic kidney disease, stage 3 unspecified: Secondary | ICD-10-CM | POA: Diagnosis not present

## 2024-07-30 DIAGNOSIS — E782 Mixed hyperlipidemia: Secondary | ICD-10-CM | POA: Diagnosis not present

## 2024-07-30 DIAGNOSIS — N1831 Chronic kidney disease, stage 3a: Secondary | ICD-10-CM | POA: Diagnosis not present

## 2024-07-30 DIAGNOSIS — I129 Hypertensive chronic kidney disease with stage 1 through stage 4 chronic kidney disease, or unspecified chronic kidney disease: Secondary | ICD-10-CM | POA: Diagnosis not present

## 2024-07-30 DIAGNOSIS — N522 Drug-induced erectile dysfunction: Secondary | ICD-10-CM | POA: Diagnosis not present

## 2024-07-30 DIAGNOSIS — J4530 Mild persistent asthma, uncomplicated: Secondary | ICD-10-CM | POA: Diagnosis not present

## 2024-07-30 DIAGNOSIS — Z0001 Encounter for general adult medical examination with abnormal findings: Secondary | ICD-10-CM | POA: Diagnosis not present
